# Patient Record
Sex: Female | Born: 1995 | Race: White | Hispanic: No | Marital: Single | Smoking: Former smoker
Health system: Southern US, Community
[De-identification: ages and names within clinical notes are randomized; demographics above are authoritative.]

## PROBLEM LIST (undated history)

## (undated) DIAGNOSIS — F32A Depression, unspecified: Secondary | ICD-10-CM

## (undated) DIAGNOSIS — R51 Headache: Secondary | ICD-10-CM

## (undated) DIAGNOSIS — Z8742 Personal history of other diseases of the female genital tract: Secondary | ICD-10-CM

## (undated) DIAGNOSIS — R87629 Unspecified abnormal cytological findings in specimens from vagina: Secondary | ICD-10-CM

## (undated) DIAGNOSIS — E039 Hypothyroidism, unspecified: Secondary | ICD-10-CM

## (undated) DIAGNOSIS — G8929 Other chronic pain: Secondary | ICD-10-CM

## (undated) DIAGNOSIS — N189 Chronic kidney disease, unspecified: Secondary | ICD-10-CM

## (undated) DIAGNOSIS — K5900 Constipation, unspecified: Secondary | ICD-10-CM

## (undated) DIAGNOSIS — J45909 Unspecified asthma, uncomplicated: Secondary | ICD-10-CM

## (undated) DIAGNOSIS — E079 Disorder of thyroid, unspecified: Secondary | ICD-10-CM

## (undated) DIAGNOSIS — R482 Apraxia: Secondary | ICD-10-CM

## (undated) DIAGNOSIS — K219 Gastro-esophageal reflux disease without esophagitis: Secondary | ICD-10-CM

## (undated) DIAGNOSIS — N809 Endometriosis, unspecified: Secondary | ICD-10-CM

## (undated) DIAGNOSIS — R519 Headache, unspecified: Secondary | ICD-10-CM

## (undated) DIAGNOSIS — O039 Complete or unspecified spontaneous abortion without complication: Secondary | ICD-10-CM

## (undated) DIAGNOSIS — F419 Anxiety disorder, unspecified: Secondary | ICD-10-CM

## (undated) DIAGNOSIS — Z87442 Personal history of urinary calculi: Secondary | ICD-10-CM

## (undated) HISTORY — PX: APPENDECTOMY: SHX54

## (undated) HISTORY — DX: Complete or unspecified spontaneous abortion without complication: O03.9

## (undated) HISTORY — PX: DIAGNOSTIC LAPAROSCOPY: SUR761

## (undated) HISTORY — PX: ENDOMETRIAL BIOPSY: SHX622

## (undated) HISTORY — DX: Unspecified abnormal cytological findings in specimens from vagina: R87.629

---
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
## 2014-02-27 ENCOUNTER — Emergency Department: Payer: Self-pay | Admitting: Internal Medicine

## 2014-06-03 ENCOUNTER — Emergency Department: Payer: Self-pay | Admitting: Emergency Medicine

## 2014-06-03 LAB — URINALYSIS, COMPLETE
Bilirubin,UR: NEGATIVE
Glucose,UR: NEGATIVE mg/dL (ref 0–75)
Ketone: NEGATIVE
Nitrite: NEGATIVE
Ph: 6 (ref 4.5–8.0)
Protein: NEGATIVE
RBC,UR: 1 /HPF (ref 0–5)
Specific Gravity: 1.013 (ref 1.003–1.030)
Squamous Epithelial: 2
WBC UR: 5 /HPF (ref 0–5)

## 2014-06-03 LAB — CBC WITH DIFFERENTIAL/PLATELET
Basophil #: 0.1 10*3/uL (ref 0.0–0.1)
Basophil %: 0.8 %
Eosinophil #: 0.1 10*3/uL (ref 0.0–0.7)
Eosinophil %: 1.3 %
HCT: 44.1 % (ref 35.0–47.0)
HGB: 14.8 g/dL (ref 12.0–16.0)
Lymphocyte #: 1.5 10*3/uL (ref 1.0–3.6)
Lymphocyte %: 21.2 %
MCH: 31.7 pg (ref 26.0–34.0)
MCHC: 33.6 g/dL (ref 32.0–36.0)
MCV: 94 fL (ref 80–100)
Monocyte #: 0.4 x10 3/mm (ref 0.2–0.9)
Monocyte %: 6.2 %
Neutrophil #: 5 10*3/uL (ref 1.4–6.5)
Neutrophil %: 70.5 %
Platelet: 224 10*3/uL (ref 150–440)
RBC: 4.68 10*6/uL (ref 3.80–5.20)
RDW: 12.1 % (ref 11.5–14.5)
WBC: 7.1 10*3/uL (ref 3.6–11.0)

## 2014-06-03 LAB — COMPREHENSIVE METABOLIC PANEL
Albumin: 4.2 g/dL (ref 3.8–5.6)
Alkaline Phosphatase: 56 U/L
Anion Gap: 9 (ref 7–16)
BUN: 8 mg/dL — ABNORMAL LOW (ref 9–21)
Bilirubin,Total: 0.8 mg/dL (ref 0.2–1.0)
Calcium, Total: 8.8 mg/dL — ABNORMAL LOW (ref 9.0–10.7)
Chloride: 107 mmol/L (ref 97–107)
Co2: 24 mmol/L (ref 16–25)
Creatinine: 0.74 mg/dL (ref 0.60–1.30)
EGFR (African American): >60
EGFR (Non-African Amer.): >60
Glucose: 64 mg/dL — ABNORMAL LOW (ref 65–99)
Osmolality: 276 (ref 275–301)
Potassium: 3.6 mmol/L (ref 3.3–4.7)
SGOT(AST): 31 U/L — ABNORMAL HIGH (ref 0–26)
SGPT (ALT): 29 U/L
Sodium: 140 mmol/L (ref 132–141)
Total Protein: 8.4 g/dL (ref 6.4–8.6)

## 2014-06-03 LAB — WET PREP, GENITAL

## 2014-06-03 LAB — HCG, QUANTITATIVE, PREGNANCY: Beta Hcg, Quant.: <1 m[IU]/mL — ABNORMAL LOW

## 2014-06-23 ENCOUNTER — Emergency Department: Payer: Self-pay | Admitting: Emergency Medicine

## 2014-06-24 LAB — CBC WITH DIFFERENTIAL/PLATELET
Basophil #: 0.1 10*3/uL (ref 0.0–0.1)
Basophil %: 0.8 %
Eosinophil #: 0.2 10*3/uL (ref 0.0–0.7)
Eosinophil %: 2.7 %
HCT: 38.3 % (ref 35.0–47.0)
HGB: 13.2 g/dL (ref 12.0–16.0)
Lymphocyte #: 3.1 10*3/uL (ref 1.0–3.6)
Lymphocyte %: 43.7 %
MCH: 32 pg (ref 26.0–34.0)
MCHC: 34.4 g/dL (ref 32.0–36.0)
MCV: 93 fL (ref 80–100)
Monocyte #: 0.5 x10 3/mm (ref 0.2–0.9)
Monocyte %: 6.3 %
Neutrophil #: 3.3 10*3/uL (ref 1.4–6.5)
Neutrophil %: 46.5 %
Platelet: 201 10*3/uL (ref 150–440)
RBC: 4.12 10*6/uL (ref 3.80–5.20)
RDW: 12 % (ref 11.5–14.5)
WBC: 7.2 10*3/uL (ref 3.6–11.0)

## 2014-06-24 LAB — URINALYSIS, COMPLETE
Bacteria: NONE SEEN
Bilirubin,UR: NEGATIVE
Glucose,UR: NEGATIVE mg/dL (ref 0–75)
Ketone: NEGATIVE
Nitrite: NEGATIVE
Ph: 7 (ref 4.5–8.0)
Protein: NEGATIVE
RBC,UR: 1 /HPF (ref 0–5)
Specific Gravity: 1.008 (ref 1.003–1.030)
Squamous Epithelial: 1
WBC UR: 3 /HPF (ref 0–5)

## 2014-06-24 LAB — COMPREHENSIVE METABOLIC PANEL
Albumin: 3.8 g/dL (ref 3.8–5.6)
Alkaline Phosphatase: 54 U/L
Anion Gap: 9 (ref 7–16)
BUN: 4 mg/dL — ABNORMAL LOW (ref 9–21)
Bilirubin,Total: 0.6 mg/dL (ref 0.2–1.0)
Calcium, Total: 8.4 mg/dL — ABNORMAL LOW (ref 9.0–10.7)
Chloride: 109 mmol/L — ABNORMAL HIGH (ref 97–107)
Co2: 23 mmol/L (ref 16–25)
Creatinine: 0.65 mg/dL (ref 0.60–1.30)
EGFR (African American): >60
EGFR (Non-African Amer.): >60
Glucose: 82 mg/dL (ref 65–99)
Osmolality: 277 (ref 275–301)
Potassium: 3.3 mmol/L (ref 3.3–4.7)
SGOT(AST): 25 U/L (ref 0–26)
SGPT (ALT): 21 U/L
Sodium: 141 mmol/L (ref 132–141)
Total Protein: 7.1 g/dL (ref 6.4–8.6)

## 2014-06-24 LAB — TROPONIN I: Troponin-I: <0.02 ng/mL

## 2014-06-24 LAB — LIPASE, BLOOD: Lipase: 70 U/L — ABNORMAL LOW (ref 73–393)

## 2014-07-12 ENCOUNTER — Emergency Department: Payer: Self-pay | Admitting: Emergency Medicine

## 2014-07-12 LAB — COMPREHENSIVE METABOLIC PANEL
Albumin: 3.9 g/dL (ref 3.8–5.6)
Alkaline Phosphatase: 62 U/L
Anion Gap: 4 — ABNORMAL LOW (ref 7–16)
BUN: 8 mg/dL — ABNORMAL LOW (ref 9–21)
Bilirubin,Total: 0.5 mg/dL (ref 0.2–1.0)
Calcium, Total: 8.8 mg/dL — ABNORMAL LOW (ref 9.0–10.7)
Chloride: 110 mmol/L — ABNORMAL HIGH (ref 97–107)
Co2: 22 mmol/L (ref 16–25)
Creatinine: 0.72 mg/dL (ref 0.60–1.30)
EGFR (African American): >60
EGFR (Non-African Amer.): >60
Glucose: 90 mg/dL (ref 65–99)
Osmolality: 270 (ref 275–301)
Potassium: 3.8 mmol/L (ref 3.3–4.7)
SGOT(AST): 30 U/L — ABNORMAL HIGH (ref 0–26)
SGPT (ALT): 29 U/L
Sodium: 136 mmol/L (ref 132–141)
Total Protein: 7.8 g/dL (ref 6.4–8.6)

## 2014-07-12 LAB — URINALYSIS, COMPLETE
Bacteria: NONE SEEN
Bilirubin,UR: NEGATIVE
Blood: NEGATIVE
Glucose,UR: NEGATIVE mg/dL (ref 0–75)
Ketone: NEGATIVE
Leukocyte Esterase: NEGATIVE
Nitrite: NEGATIVE
Ph: 7 (ref 4.5–8.0)
Protein: NEGATIVE
RBC,UR: 3 /HPF (ref 0–5)
Specific Gravity: 1.016 (ref 1.003–1.030)
Squamous Epithelial: 1
WBC UR: 1 /HPF (ref 0–5)

## 2014-07-12 LAB — CBC WITH DIFFERENTIAL/PLATELET
Basophil #: 0.1 10*3/uL (ref 0.0–0.1)
Basophil %: 0.9 %
Eosinophil #: 0.2 10*3/uL (ref 0.0–0.7)
Eosinophil %: 2.5 %
HCT: 39.6 % (ref 35.0–47.0)
HGB: 13.3 g/dL (ref 12.0–16.0)
Lymphocyte #: 1.8 10*3/uL (ref 1.0–3.6)
Lymphocyte %: 29.6 %
MCH: 31.3 pg (ref 26.0–34.0)
MCHC: 33.4 g/dL (ref 32.0–36.0)
MCV: 94 fL (ref 80–100)
Monocyte #: 0.4 x10 3/mm (ref 0.2–0.9)
Monocyte %: 6.7 %
Neutrophil #: 3.7 10*3/uL (ref 1.4–6.5)
Neutrophil %: 60.3 %
Platelet: 240 10*3/uL (ref 150–440)
RBC: 4.24 10*6/uL (ref 3.80–5.20)
RDW: 12.2 % (ref 11.5–14.5)
WBC: 6.1 10*3/uL (ref 3.6–11.0)

## 2014-07-12 LAB — LIPASE, BLOOD: Lipase: 97 U/L (ref 73–393)

## 2014-08-09 ENCOUNTER — Emergency Department: Payer: Self-pay | Admitting: Emergency Medicine

## 2014-08-09 LAB — URINALYSIS, COMPLETE
Bacteria: NONE SEEN
Bilirubin,UR: NEGATIVE
Blood: NEGATIVE
Glucose,UR: NEGATIVE mg/dL (ref 0–75)
Ketone: NEGATIVE
Leukocyte Esterase: NEGATIVE
Nitrite: NEGATIVE
Ph: 8 (ref 4.5–8.0)
Protein: NEGATIVE
RBC,UR: 1 /HPF (ref 0–5)
Specific Gravity: 1.005 (ref 1.003–1.030)
Squamous Epithelial: 1
WBC UR: <1 /HPF (ref 0–5)

## 2014-08-09 LAB — CBC
HCT: 42.9 % (ref 35.0–47.0)
HGB: 14.2 g/dL (ref 12.0–16.0)
MCH: 31 pg (ref 26.0–34.0)
MCHC: 33 g/dL (ref 32.0–36.0)
MCV: 94 fL (ref 80–100)
Platelet: 243 10*3/uL (ref 150–440)
RBC: 4.57 10*6/uL (ref 3.80–5.20)
RDW: 11.9 % (ref 11.5–14.5)
WBC: 7 10*3/uL (ref 3.6–11.0)

## 2014-08-09 LAB — WET PREP, GENITAL

## 2014-08-27 DIAGNOSIS — F419 Anxiety disorder, unspecified: Secondary | ICD-10-CM | POA: Insufficient documentation

## 2014-08-27 DIAGNOSIS — K5900 Constipation, unspecified: Secondary | ICD-10-CM | POA: Insufficient documentation

## 2014-08-27 DIAGNOSIS — N809 Endometriosis, unspecified: Secondary | ICD-10-CM | POA: Insufficient documentation

## 2014-08-27 DIAGNOSIS — J4520 Mild intermittent asthma, uncomplicated: Secondary | ICD-10-CM | POA: Insufficient documentation

## 2014-12-03 ENCOUNTER — Emergency Department: Payer: Self-pay | Admitting: Emergency Medicine

## 2015-01-18 ENCOUNTER — Emergency Department: Payer: Self-pay | Admitting: Student

## 2015-01-18 LAB — COMPREHENSIVE METABOLIC PANEL
Albumin: 4.2 g/dL
Alkaline Phosphatase: 51 U/L
Anion Gap: 9 (ref 7–16)
BUN: 11 mg/dL
Bilirubin,Total: 0.9 mg/dL
Calcium, Total: 9.1 mg/dL
Chloride: 105 mmol/L
Co2: 23 mmol/L
Creatinine: 0.55 mg/dL
EGFR (African American): >60
EGFR (Non-African Amer.): >60
Glucose: 103 mg/dL — ABNORMAL HIGH
Potassium: 3.5 mmol/L
SGOT(AST): 21 U/L
SGPT (ALT): 20 U/L
Sodium: 137 mmol/L
Total Protein: 7.1 g/dL

## 2015-01-18 LAB — URINALYSIS, COMPLETE
Bacteria: NONE SEEN
Bilirubin,UR: NEGATIVE
Glucose,UR: NEGATIVE mg/dL (ref 0–75)
Ketone: NEGATIVE
Nitrite: NEGATIVE
Ph: 7 (ref 4.5–8.0)
Protein: NEGATIVE
RBC,UR: 4 /HPF (ref 0–5)
Specific Gravity: 1.014 (ref 1.003–1.030)
Squamous Epithelial: 1
WBC UR: 1 /HPF (ref 0–5)

## 2015-01-18 LAB — CBC
HCT: 39.8 % (ref 35.0–47.0)
HGB: 13.6 g/dL (ref 12.0–16.0)
MCH: 31.8 pg (ref 26.0–34.0)
MCHC: 34.1 g/dL (ref 32.0–36.0)
MCV: 93 fL (ref 80–100)
Platelet: 209 10*3/uL (ref 150–440)
RBC: 4.27 10*6/uL (ref 3.80–5.20)
RDW: 12.1 % (ref 11.5–14.5)
WBC: 9.3 10*3/uL (ref 3.6–11.0)

## 2015-01-18 LAB — LIPASE, BLOOD: Lipase: 25 U/L

## 2015-02-01 ENCOUNTER — Emergency Department
Admit: 2015-02-01 | Disposition: A | Discharge status: Home / Self Care | Payer: Self-pay | Admitting: Emergency Medicine

## 2015-02-01 LAB — COMPREHENSIVE METABOLIC PANEL
Albumin: 4.6 g/dL
Alkaline Phosphatase: 55 U/L
Anion Gap: 9 (ref 7–16)
BUN: 8 mg/dL
Bilirubin,Total: 0.8 mg/dL
Calcium, Total: 9.8 mg/dL
Chloride: 105 mmol/L
Co2: 25 mmol/L
Creatinine: 0.68 mg/dL
EGFR (African American): >60
EGFR (Non-African Amer.): >60
Glucose: 93 mg/dL
Potassium: 3.8 mmol/L
SGOT(AST): 25 U/L
SGPT (ALT): 25 U/L
Sodium: 139 mmol/L
Total Protein: 7.9 g/dL

## 2015-02-01 LAB — CBC WITH DIFFERENTIAL/PLATELET
Basophil #: 0.1 10*3/uL (ref 0.0–0.1)
Basophil %: 0.7 %
Eosinophil #: 0.2 10*3/uL (ref 0.0–0.7)
Eosinophil %: 1.8 %
HCT: 42.1 % (ref 35.0–47.0)
HGB: 14.2 g/dL (ref 12.0–16.0)
Lymphocyte #: 3.7 10*3/uL — ABNORMAL HIGH (ref 1.0–3.6)
Lymphocyte %: 44 %
MCH: 31.5 pg (ref 26.0–34.0)
MCHC: 33.8 g/dL (ref 32.0–36.0)
MCV: 93 fL (ref 80–100)
Monocyte #: 0.5 x10 3/mm (ref 0.2–0.9)
Monocyte %: 6.3 %
Neutrophil #: 4 10*3/uL (ref 1.4–6.5)
Neutrophil %: 47.2 %
Platelet: 298 10*3/uL (ref 150–440)
RBC: 4.51 10*6/uL (ref 3.80–5.20)
RDW: 12 % (ref 11.5–14.5)
WBC: 8.5 10*3/uL (ref 3.6–11.0)

## 2015-02-01 LAB — URINALYSIS, COMPLETE
Bacteria: NONE SEEN
Bilirubin,UR: NEGATIVE
Glucose,UR: NEGATIVE mg/dL (ref 0–75)
Ketone: NEGATIVE
Nitrite: NEGATIVE
Ph: 5 (ref 4.5–8.0)
Protein: NEGATIVE
Specific Gravity: 1.008 (ref 1.003–1.030)

## 2015-02-01 LAB — TROPONIN I: Troponin-I: <0.03 ng/mL

## 2015-02-01 LAB — LIPASE, BLOOD: Lipase: 50 U/L

## 2015-02-15 NOTE — Op Note (Signed)
PATIENT NAME:  Hannah Shaw, Hannah Shaw MR#:  [id number] DATE OF BIRTH:  12/12/1995  DATE OF PROCEDURE:  08/23/2013  DATE OF BIRTH: 11/30/1981.   DATE OF PROCEDURE: 06/26/2013.   PREOPERATIVE DIAGNOSIS: Chronic pelvic pain.   POSTOPERATIVE DIAGNOSES: Peritoneal endometriosis.   PROCEDURE: Laparoscopic excision of peritoneal endometriosis.   ANESTHESIA: General endotracheal anesthesia.   SURGEON: Dr. Ouida Sills.  INDICATION: This is a 19 year old gravida 0 patient with 4 to 6 month history of chronic pelvic pain, unrelenting, requiring daily narcotics.   PROCEDURE: After adequate general endotracheal anesthesia, the patient's legs were placed in the [place]. The patient's abdomen, perineum, and vagina were prepped with non-Betadine cleansing solution. The patient was draped in normal sterile fashion. The patient's bladder was catheterized yielding 25 mL clear urine. Sponge stick was placed in the vagina to be used for uterine manipulation during the procedure. An infraumbilical incision was made after injecting with 0.5% Marcaine. The laparoscope was then advanced into the abdominal cavity without difficulty and the patient's abdomen was insufflated with carbon dioxide. A second port, 5 mm  port site was placed 2 centimeters medial to the left anterior iliac spine under direct visualization and the prior injection with Marcaine. The port was placed without difficulty. A similar procedure was repeated on the patient's right side, again, 5 mm port under direct visualization was placed. The patient was placed in Trendelenburg. Initial impression showed the lower pelvic peritoneum was congested. Fallopian tubes and ovaries appeared normal. There was notable evidence of endometriosis in both ovarian fossae anterior to the uterosacral ligaments bilaterally. Ureters were identified and their path was tracked. Harmonic scalpel was then brought into the operative field, and each patch of endometriosis  approximately 7 x 5 mm was grasped with atraumatic grasper and this endometriosis was dissected and excised with the Harmonic scalpel without difficulty. Good hemostasis was noted. There was also  a patch of endometriosis in the deep posterior cul-de-sac. Given the angles this could not be excised and the tissue was ablated and fulgurated with the Kleppingers and Harmonic scalpel. The upper abdomen appeared normal. There was no evidence of endometriosis on the bowel and the patient was status post an appendectomy. There were a few loose staples identifying the posterior cul-de-sac intertwined with the peritoneum. These were left in situ. The intra-abdominal pressure was brought to 6 mmHg. The incision sites appeared hemostatic, and again, the ureters appeared normal and coursing around the excisional biopsy areas. Pictures were taken. The patient's abdomen was deflated and the infraumbilical incision was closed with a deep 2-0 Vicryl fascial layer and all skin incisions were closed with interrupted 4-0 Vicryl sutures. Steri-Strips applied and a pressure dressing with a Tegaderm placed. Sponge stick was removed. There were no complications. The patient tolerated the procedure well.   ESTIMATED BLOOD LOSS: Minimal.   INTRA-OP FLUIDS 900 mL.   URINE OUTPUT: Was 25 mL.   The patient was taken to recovery in good condition.    ____________________________ Boykin Nearing, MD tjs:sg D: 08/23/2013 11:22:10 ET T: 08/23/2013 11:35:38 ET JOB#: [id number]  cc: Boykin Nearing, MD, <Dictator> Boykin Nearing MD ELECTRONICALLY SIGNED 08/25/2013 9:50

## 2015-06-29 ENCOUNTER — Emergency Department
Admission: EM | Admit: 2015-06-29 | Discharge: 2015-06-29 | Disposition: A | Discharge status: Home / Self Care | Payer: Medicaid Other | Attending: Emergency Medicine | Admitting: Emergency Medicine

## 2015-06-29 ENCOUNTER — Encounter: Payer: Self-pay | Admitting: Emergency Medicine

## 2015-06-29 DIAGNOSIS — R509 Fever, unspecified: Secondary | ICD-10-CM | POA: Diagnosis present

## 2015-06-29 DIAGNOSIS — Z3202 Encounter for pregnancy test, result negative: Secondary | ICD-10-CM | POA: Insufficient documentation

## 2015-06-29 DIAGNOSIS — L539 Erythematous condition, unspecified: Secondary | ICD-10-CM | POA: Insufficient documentation

## 2015-06-29 DIAGNOSIS — R1110 Vomiting, unspecified: Secondary | ICD-10-CM | POA: Diagnosis not present

## 2015-06-29 DIAGNOSIS — J45909 Unspecified asthma, uncomplicated: Secondary | ICD-10-CM | POA: Diagnosis not present

## 2015-06-29 DIAGNOSIS — Z79899 Other long term (current) drug therapy: Secondary | ICD-10-CM | POA: Insufficient documentation

## 2015-06-29 DIAGNOSIS — Z791 Long term (current) use of non-steroidal anti-inflammatories (NSAID): Secondary | ICD-10-CM | POA: Insufficient documentation

## 2015-06-29 DIAGNOSIS — R5081 Fever presenting with conditions classified elsewhere: Secondary | ICD-10-CM | POA: Diagnosis not present

## 2015-06-29 HISTORY — DX: Endometriosis, unspecified: N80.9

## 2015-06-29 LAB — COMPREHENSIVE METABOLIC PANEL
ALT: 18 U/L (ref 14–54)
AST: 21 U/L (ref 15–41)
Albumin: 4.7 g/dL (ref 3.5–5.0)
Alkaline Phosphatase: 51 U/L (ref 38–126)
Anion gap: 7 (ref 5–15)
BUN: <5 mg/dL — ABNORMAL LOW (ref 6–20)
CO2: 29 mmol/L (ref 22–32)
Calcium: 9.8 mg/dL (ref 8.9–10.3)
Chloride: 104 mmol/L (ref 101–111)
Creatinine, Ser: 0.6 mg/dL (ref 0.44–1.00)
GFR calc Af Amer: >60 mL/min (ref >60)
GFR calc non Af Amer: >60 mL/min (ref >60)
Glucose, Bld: 100 mg/dL — ABNORMAL HIGH (ref 65–99)
Potassium: 4.6 mmol/L (ref 3.5–5.1)
Sodium: 140 mmol/L (ref 135–145)
Total Bilirubin: 0.6 mg/dL (ref 0.3–1.2)
Total Protein: 8 g/dL (ref 6.5–8.1)

## 2015-06-29 LAB — URINALYSIS COMPLETE WITH MICROSCOPIC (ARMC ONLY)
Bilirubin Urine: NEGATIVE
Glucose, UA: NEGATIVE mg/dL
Ketones, ur: NEGATIVE mg/dL
Leukocytes, UA: NEGATIVE
Nitrite: NEGATIVE
Protein, ur: NEGATIVE mg/dL
Specific Gravity, Urine: 1.005 (ref 1.005–1.030)
pH: 8 (ref 5.0–8.0)

## 2015-06-29 LAB — CBC
HCT: 40.7 % (ref 35.0–47.0)
Hemoglobin: 14.3 g/dL (ref 12.0–16.0)
MCH: 32.7 pg (ref 26.0–34.0)
MCHC: 35.1 g/dL (ref 32.0–36.0)
MCV: 93 fL (ref 80.0–100.0)
Platelets: 253 10*3/uL (ref 150–440)
RBC: 4.37 MIL/uL (ref 3.80–5.20)
RDW: 12 % (ref 11.5–14.5)
WBC: 8.1 10*3/uL (ref 3.6–11.0)

## 2015-06-29 LAB — WET PREP, GENITAL
Clue Cells Wet Prep HPF POC: NONE SEEN
Trich, Wet Prep: NONE SEEN

## 2015-06-29 LAB — CHLAMYDIA/NGC RT PCR (ARMC ONLY)
Chlamydia Tr: NOT DETECTED
N gonorrhoeae: NOT DETECTED

## 2015-06-29 LAB — LIPASE, BLOOD: Lipase: 23 U/L (ref 22–51)

## 2015-06-29 LAB — PREGNANCY, URINE: Preg Test, Ur: NEGATIVE

## 2015-06-29 MED ORDER — ONDANSETRON HCL 4 MG/2ML IJ SOLN
4.0000 mg | Freq: Once | INTRAMUSCULAR | Status: DC
Start: 2015-06-29 — End: 2015-06-29
  Filled 2015-06-29: qty 2

## 2015-06-29 MED ORDER — MORPHINE SULFATE (PF) 2 MG/ML IV SOLN
2.0000 mg | Freq: Once | INTRAVENOUS | Status: AC
  Administered 2015-06-29: 2 mg via INTRAMUSCULAR

## 2015-06-29 MED ORDER — ONDANSETRON 4 MG PO TBDP
4.0000 mg | ORAL_TABLET | Freq: Once | ORAL | Status: AC
  Administered 2015-06-29: 4 mg via ORAL
  Filled 2015-06-29: qty 1

## 2015-06-29 MED ORDER — MORPHINE SULFATE (PF) 2 MG/ML IV SOLN
2.0000 mg | Freq: Once | INTRAVENOUS | Status: DC
Start: 2015-06-29 — End: 2015-06-29
  Filled 2015-06-29: qty 1

## 2015-06-29 NOTE — Discharge Instructions (Signed)
Fever, Adult A fever is a temperature of 100.4 F (38 C) or above.  HOME CARE  Take fever medicine as told by your doctor. Do not  take aspirin for fever if you are younger than 20 years of age.  If you are given antibiotic medicine, take it as told. Finish the medicine even if you start to feel better.  Rest.  Drink enough fluids to keep your pee (urine) clear or pale yellow. Do not drink alcohol.  Take a bath or shower with room temperature water. Do not use ice water or alcohol sponge baths.  Wear lightweight, loose clothes. GET HELP RIGHT AWAY IF:   You are short of breath or have trouble breathing.  You are very weak.  You are dizzy or you pass out (faint).  You are very thirsty or are making little or no urine.  You have new pain.  You throw up (vomit) or have watery poop (diarrhea).  You keep throwing up or having watery poop for more than 1 to 2 days.  You have a stiff neck or light bothers your eyes.  You have a skin rash.  You have a fever or problems (symptoms) that last for more than 2 to 3 days.  You have a fever and your problems quickly get worse.  You keep throwing up the fluids you drink.  You do not feel better after 3 days.  You have new problems. MAKE SURE YOU:   Understand these instructions.  Will watch your condition.  Will get help right away if you are not doing well or get worse. Document Released: 07/21/2008 Document Revised: 01/04/2012 Document Reviewed: 08/13/2011 [facility] Patient Information 2015 [place], [place]. This information is not intended to replace advice given to you by your health care provider. Make sure you discuss any questions you have with your health care provider. I'm not sure if the fever her having in the muscle aches related to the tick bite or if you have a viral infection. Just in case I will give you some doxycycline one pill twice a day please return for worse pain higher fever or if she feels sicker. Have  your doctor continue to follow up with you see your doctor next few days.

## 2015-06-29 NOTE — ED Provider Notes (Signed)
[facility] Emergency Department Provider Note  ____________________________________________  Time seen: Approximately 6:14 PM  I have reviewed the triage vital signs and the nursing notes.   HISTORY  Chief Complaint Fever; Generalized Body Aches; and Emesis    HPI Hannah Shaw is a 19 y.o. female patient reports 4 days worth of generalized body aches fever up to 102. Patient also says she is coughing and has vomited several times patient also complains of sore throat patient reports that most the members of her family are sick with the same symptoms. Here the temperature is only 99.9  Past Medical History  Diagnosis Date  . Asthma   . Thyroid disease   . Endometriosis     There are no active problems to display for this patient.   History reviewed. No pertinent past surgical history.  Current Outpatient Rx  Name  Route  Sig  Dispense  Refill  . clonazePAM (KLONOPIN) 1 MG tablet   Oral   Take 1 mg by mouth 2 (two) times daily as needed.         Marland Kitchen HYDROcodone-acetaminophen (NORCO/VICODIN) 5-325 MG per tablet   Oral   Take 1 tablet by mouth every 6 (six) hours as needed for pain.         Marland Kitchen ipratropium (ATROVENT HFA) 17 MCG/ACT inhaler   Inhalation   Inhale 2 puffs into the lungs every 6 (six) hours.         Marland Kitchen levothyroxine (SYNTHROID, LEVOTHROID) 25 MCG tablet   Oral   Take 25 mcg by mouth daily.         . naproxen (NAPROSYN) 500 MG tablet   Oral   Take 1 tablet (500 mg total) by mouth 2 (two) times daily with a meal.   30 tablet   0     Allergies Betadine; Fentanyl; Iodine; and Morphine and related  No family history on file.  Social History Social History  Substance Use Topics  . Smoking status: Never Smoker   . Smokeless tobacco: None  . Alcohol Use: No    Review of Systems Constitutional: See history of present illness Eyes: No visual changes. ENT: No sore throat. Cardiovascular: Denies chest  pain. Respiratory: Denies shortness of breath. Gastrointestinal: See history of present illness.  No diarrhea.  No constipation. Genitourinary: Negative for dysuria. Musculoskeletal: Negative for back pain. Skin: Negative for rash. Neurological: Negative for headaches, focal weakness or numbness.  10-point ROS otherwise negative.  ____________________________________________   PHYSICAL EXAM:  VITAL SIGNS: ED Triage Vitals  Enc Vitals Group     BP 06/29/15 1743 114/68 mmHg     Pulse Rate 06/29/15 1743 104     Resp 06/29/15 1743 16     Temp 06/29/15 1743 99.9 F (37.7 C)     Temp Source 06/29/15 1743 Oral     SpO2 06/29/15 1743 100 %     Weight 06/29/15 1743 100 lb (45.36 kg)     Height 06/29/15 1743 5\' 2"  (1.575 m)     Head Cir --      Peak Flow --      Pain Score 06/29/15 1744 10     Pain Loc --      Pain Edu? --      Excl. in [place]? --     Constitutional: Alert and oriented. Well appearing and in no acute distress. Eyes: Conjunctivae are normal. PERRL. EOMI. Head: Atraumatic. Nose: No congestion/rhinnorhea. Mouth/Throat: Mucous membranes are moist.  Oropharynx non-erythematous.  Neck: No stridor.  Cardiovascular: Normal rate, regular rhythm. Grossly normal heart sounds.  Good peripheral circulation. Respiratory: Normal respiratory effort.  No retractions. Lungs CTAB. Gastrointestinal: Soft tender to palpation and percussion of the lower abdomen only No distention. No abdominal bruits. No CVA tenderness. Genitourinary: Normal perineum vagina has some dark blood and that there is no active bleeding and it however cervix is closed there is no cervical motion tenderness no real adnexal tenderness ovaries are palpable Musculoskeletal: No lower extremity tenderness nor edema.  No joint effusions. There is a small spot of blood and about 2-3 mm of surrounding erythema on the inner right thigh in the middle of the thigh where her tick bite was this does not look like Lyme disease or  anything else. Tick was  on there are couple days ago she says. Neurologic:  Normal speech and language. No gross focal neurologic deficits are appreciated. No gait instability. Skin:  Skin is warm, dry and intact. No rash noted. Psychiatric: Mood and affect are normal. Speech and behavior are normal.  ____________________________________________   LABS (all labs ordered are listed, but only abnormal results are displayed)  Labs Reviewed  COMPREHENSIVE METABOLIC PANEL - Abnormal; Notable for the following:    Glucose, Bld 100 (*)    BUN <5 (*)    All other components within normal limits  URINALYSIS COMPLETEWITH MICROSCOPIC ([facility] ONLY) - Abnormal; Notable for the following:    Color, Urine STRAW (*)    APPearance CLEAR (*)    Hgb urine dipstick 3+ (*)    Bacteria, UA RARE (*)    Squamous Epithelial / LPF 0-5 (*)    All other components within normal limits  CHLAMYDIA/NGC RT PCR ([facility] ONLY)  WET PREP, GENITAL  LIPASE, BLOOD  CBC  PREGNANCY, URINE   ____________________________________________  EKG   ____________________________________________  RADIOLOGY   ____________________________________________   PROCEDURES   ____________________________________________   INITIAL IMPRESSION / ASSESSMENT AND PLAN / ED COURSE  Pertinent labs & imaging results that were available during my care of the patient were reviewed by me and considered in my medical decision making (see chart for details). Patient asked for pain medicine. Patient says that she just gets a little bit of pain medicine with morphine she doesn't have any problems she only gets hives that she gets a lot of morphine I will try 2 mg of morphine for her. ----------------------------------------- 9:04 PM on 06/29/2015 -----------------------------------------  Patient reports she had no hives with the morphine. She says she might have a little bit of itching but not really very much of anything.  Patient also reports she has an ultrasound scheduled her doctors evaluating her for endometriosis. This may explain probably does explain her lower abdominal pain. I will treat her with doxycycline just in case of any infection and the possibility of something with a tick bite she had. ____________________________________________   FINAL CLINICAL IMPRESSION(S) / ED DIAGNOSES  Final diagnoses:  Other specified fever      Nena Polio, MD 06/29/15 2131

## 2015-06-29 NOTE — ED Notes (Signed)
Patient states she has sore throat,, been vomiting for a few days, had teeth removed and feels sick.

## 2015-06-29 NOTE — ED Notes (Signed)
Pt reports fever (103), body aches and vomiting x3 days.

## 2015-06-30 LAB — POCT RAPID STREP A: Streptococcus, Group A Screen (Direct): NEGATIVE

## 2015-07-03 ENCOUNTER — Telehealth: Payer: Self-pay | Admitting: Emergency Medicine

## 2015-07-04 ENCOUNTER — Telehealth: Payer: Self-pay | Admitting: Emergency Medicine

## 2015-07-04 NOTE — ED Notes (Signed)
Mom and patient called saying that pt has rash from doxycyclinej.  I told her tostop the med if she felt that was making her have rash.  Taking benadryl.  Says she just started thsi med yesterday.  I asked about follow up plans and she says she is going to pcp on Monday.  i asked her to call pcp and let them know about the rash and be seen.  She agrees.

## 2015-08-12 ENCOUNTER — Emergency Department
Admission: EM | Admit: 2015-08-12 | Discharge: 2015-08-13 | Disposition: A | Discharge status: Home / Self Care | Payer: Medicaid Other | Attending: Emergency Medicine | Admitting: Emergency Medicine

## 2015-08-12 ENCOUNTER — Encounter: Payer: Self-pay | Admitting: Emergency Medicine

## 2015-08-12 DIAGNOSIS — N941 Unspecified dyspareunia: Secondary | ICD-10-CM | POA: Diagnosis not present

## 2015-08-12 DIAGNOSIS — Z79899 Other long term (current) drug therapy: Secondary | ICD-10-CM | POA: Insufficient documentation

## 2015-08-12 DIAGNOSIS — G8929 Other chronic pain: Secondary | ICD-10-CM

## 2015-08-12 DIAGNOSIS — Z3202 Encounter for pregnancy test, result negative: Secondary | ICD-10-CM | POA: Insufficient documentation

## 2015-08-12 DIAGNOSIS — Z791 Long term (current) use of non-steroidal anti-inflammatories (NSAID): Secondary | ICD-10-CM | POA: Insufficient documentation

## 2015-08-12 DIAGNOSIS — R102 Pelvic and perineal pain: Secondary | ICD-10-CM | POA: Diagnosis present

## 2015-08-12 LAB — CBC WITH DIFFERENTIAL/PLATELET
Basophils Absolute: 0.1 10*3/uL (ref 0–0.1)
Basophils Relative: 1 %
Eosinophils Absolute: 0.1 10*3/uL (ref 0–0.7)
Eosinophils Relative: 2 %
HCT: 41.1 % (ref 35.0–47.0)
Hemoglobin: 14.5 g/dL (ref 12.0–16.0)
Lymphocytes Relative: 32 %
Lymphs Abs: 2.9 10*3/uL (ref 1.0–3.6)
MCH: 32.1 pg (ref 26.0–34.0)
MCHC: 35.3 g/dL (ref 32.0–36.0)
MCV: 91.1 fL (ref 80.0–100.0)
Monocytes Absolute: 0.6 10*3/uL (ref 0.2–0.9)
Monocytes Relative: 7 %
Neutro Abs: 5.5 10*3/uL (ref 1.4–6.5)
Neutrophils Relative %: 58 %
Platelets: 251 10*3/uL (ref 150–440)
RBC: 4.52 MIL/uL (ref 3.80–5.20)
RDW: 11.7 % (ref 11.5–14.5)
WBC: 9.2 10*3/uL (ref 3.6–11.0)

## 2015-08-12 LAB — URINALYSIS COMPLETE WITH MICROSCOPIC (ARMC ONLY)
Bacteria, UA: NONE SEEN
Bilirubin Urine: NEGATIVE
Glucose, UA: NEGATIVE mg/dL
Ketones, ur: NEGATIVE mg/dL
Nitrite: NEGATIVE
Protein, ur: NEGATIVE mg/dL
Specific Gravity, Urine: 1.011 (ref 1.005–1.030)
pH: 5 (ref 5.0–8.0)

## 2015-08-12 LAB — COMPREHENSIVE METABOLIC PANEL
ALT: 22 U/L (ref 14–54)
AST: 22 U/L (ref 15–41)
Albumin: 4.5 g/dL (ref 3.5–5.0)
Alkaline Phosphatase: 50 U/L (ref 38–126)
Anion gap: 6 (ref 5–15)
BUN: 8 mg/dL (ref 6–20)
CO2: 26 mmol/L (ref 22–32)
Calcium: 9.1 mg/dL (ref 8.9–10.3)
Chloride: 105 mmol/L (ref 101–111)
Creatinine, Ser: 0.61 mg/dL (ref 0.44–1.00)
GFR calc Af Amer: >60 mL/min (ref >60)
GFR calc non Af Amer: >60 mL/min (ref >60)
Glucose, Bld: 87 mg/dL (ref 65–99)
Potassium: 4.4 mmol/L (ref 3.5–5.1)
Sodium: 137 mmol/L (ref 135–145)
Total Bilirubin: 1.2 mg/dL (ref 0.3–1.2)
Total Protein: 7.8 g/dL (ref 6.5–8.1)

## 2015-08-12 LAB — POCT PREGNANCY, URINE: Preg Test, Ur: NEGATIVE

## 2015-08-12 NOTE — ED Notes (Signed)
MD at bedside. 

## 2015-08-12 NOTE — ED Notes (Addendum)
Lower abdominal pain.  Patient states she has history of endometriosis, pain worsened tonight.  Normally take Percocet 5/325 mg for pain.  Patient states she still has RX, but not helping pain tonight.  Last taken this morning at 0900.

## 2015-08-12 NOTE — ED Notes (Signed)
POC Urine pregnancy: Negative 

## 2015-08-12 NOTE — ED Provider Notes (Signed)
[facility] Emergency Department Provider Note  ____________________________________________  Time seen: 11:30 PM  I have reviewed the triage vital signs and the nursing notes.   HISTORY  Chief Complaint Abdominal Pain      HPI Hannah Shaw is a 19 y.o. female presents with central pelvic pain for "weeks". Patient admits to history of endometriosis for which she takes Percocet at home which she is prescribed by her PCP. Patient states however that her Percocet has not helping her pain current pain score is 10 out of 10. Patient also admits to unprotected sex and dyspareunia.     Past Medical History  Diagnosis Date  . Asthma   . Thyroid disease   . Endometriosis     There are no active problems to display for this patient.   Past surgical history Appendectomy  Current Outpatient Rx  Name  Route  Sig  Dispense  Refill  . clonazePAM (KLONOPIN) 1 MG tablet   Oral   Take 1 mg by mouth 2 (two) times daily as needed.         Marland Kitchen HYDROcodone-acetaminophen (NORCO/VICODIN) 5-325 MG per tablet   Oral   Take 1 tablet by mouth every 6 (six) hours as needed for pain.         Marland Kitchen ipratropium (ATROVENT HFA) 17 MCG/ACT inhaler   Inhalation   Inhale 2 puffs into the lungs every 6 (six) hours.         Marland Kitchen levothyroxine (SYNTHROID, LEVOTHROID) 25 MCG tablet   Oral   Take 25 mcg by mouth daily.         . naproxen (NAPROSYN) 500 MG tablet   Oral   Take 1 tablet (500 mg total) by mouth 2 (two) times daily with a meal.   30 tablet   0     Allergies Betadine; Fentanyl; Iodine; and Morphine and related  No family history on file.  Social History Social History  Substance Use Topics  . Smoking status: Never Smoker   . Smokeless tobacco: None  . Alcohol Use: No    Review of Systems  Constitutional: Negative for fever. Eyes: Negative for visual changes. ENT: Negative for sore throat. Cardiovascular: Negative for chest pain. Respiratory:  Negative for shortness of breath. Gastrointestinal: Negative for abdominal pain, vomiting and diarrhea. Positive for pelvic pain Genitourinary: Negative for dysuria. Musculoskeletal: Negative for back pain. Skin: Negative for rash. Neurological: Negative for headaches, focal weakness or numbness.   10-point ROS otherwise negative.  ____________________________________________   PHYSICAL EXAM:  VITAL SIGNS: ED Triage Vitals  Enc Vitals Group     BP 08/12/15 2118 122/69 mmHg     Pulse Rate 08/12/15 2118 95     Resp 08/12/15 2118 16     Temp 08/12/15 2118 98.1 F (36.7 C)     Temp Source 08/12/15 2118 Oral     SpO2 08/12/15 2118 99 %     Weight 08/12/15 2118 105 lb (47.628 kg)     Height 08/12/15 2118 5\' 2"  (1.575 m)     Head Cir --      Peak Flow --      Pain Score 08/12/15 2121 10     Pain Loc --      Pain Edu? --      Excl. in [place]? --      Constitutional: Alert and oriented. Well appearing and in no distress. Eyes: Conjunctivae are normal. PERRL. Normal extraocular movements. ENT   Head: Normocephalic and atraumatic.  Nose: No congestion/rhinnorhea.   Mouth/Throat: Mucous membranes are moist.   Neck: No stridor. Cardiovascular: Normal rate, regular rhythm. Normal and symmetric distal pulses are present in all extremities. No murmurs, rubs, or gallops. Respiratory: Normal respiratory effort without tachypnea nor retractions. Breath sounds are clear and equal bilaterally. No wheezes/rales/rhonchi. Gastrointestinal: Soft and nontender. No distention. There is no CVA tenderness. Suprapubic tenderness to palpation.  Genitourinary: Unremarkable external vaginal exam speculum exam likewise unremarkable. No cervical motion tenderness. Musculoskeletal: Nontender with normal range of motion in all extremities. No joint effusions.  No lower extremity tenderness nor edema. Neurologic:  Normal speech and language. No gross focal neurologic deficits are appreciated.  Speech is normal.  Skin:  Skin is warm, dry and intact. No rash noted. Psychiatric: Mood and affect are normal. Speech and behavior are normal. Patient exhibits appropriate insight and judgment.  ____________________________________________    LABS (pertinent positives/negatives)  Labs Reviewed  URINALYSIS COMPLETEWITH MICROSCOPIC ([place]) - Abnormal; Notable for the following:    Color, Urine YELLOW (*)    APPearance CLEAR (*)    Hgb urine dipstick 2+ (*)    Leukocytes, UA TRACE (*)    Squamous Epithelial / LPF 0-5 (*)    All other components within normal limits  CBC WITH DIFFERENTIAL/PLATELET  COMPREHENSIVE METABOLIC PANEL  POC URINE PREG, ED  POCT PREGNANCY, URINE         RADIOLOGY     US Pelvis Complete (Final result) Result time: 08/13/15 03:34:14   Final result by Rad Results In Interface (08/13/15 03:34:14)   Narrative:   CLINICAL DATA: Right-sided pelvic pain for 3-4 days.  EXAM: TRANSABDOMINAL AND TRANSVAGINAL ULTRASOUND OF PELVIS  TECHNIQUE: Both transabdominal and transvaginal ultrasound examinations of the pelvis were performed. Transabdominal technique was performed for global imaging of the pelvis including uterus, ovaries, adnexal regions, and pelvic cul-de-sac. It was necessary to proceed with endovaginal exam following the transabdominal exam to visualize the uterus, endometrium and ovaries.  COMPARISON: Pelvic ultrasound 02/01/2015  FINDINGS: Uterus  Measurements: 5.1 x 2.9 x 3.2 cm. No fibroids or other mass visualized.  Endometrium  Thickness: 6 mm. No focal abnormality visualized.  Right ovary  Measurements: 2.1 x 1.8 x 1.9 cm. Normal appearance/no adnexal mass. Blood flow is noted.  Left ovary  Measurements: 2.5 x 1.7 x 1.8 cm. Normal appearance/no adnexal mass. Blood flow is noted. Physiologic follicles are seen.  Other findings  Trace free fluid.  IMPRESSION: Normal pelvic ultrasound.   Electronically  Signed By: Jeb Levering M.D. On: 08/13/2015 03:34          US Transvaginal Non-OB (Final result) Result time: 08/13/15 03:34:14   Final result by Rad Results In Interface (08/13/15 03:34:14)   Narrative:   CLINICAL DATA: Right-sided pelvic pain for 3-4 days.  EXAM: TRANSABDOMINAL AND TRANSVAGINAL ULTRASOUND OF PELVIS  TECHNIQUE: Both transabdominal and transvaginal ultrasound examinations of the pelvis were performed. Transabdominal technique was performed for global imaging of the pelvis including uterus, ovaries, adnexal regions, and pelvic cul-de-sac. It was necessary to proceed with endovaginal exam following the transabdominal exam to visualize the uterus, endometrium and ovaries.  COMPARISON: Pelvic ultrasound 02/01/2015  FINDINGS: Uterus  Measurements: 5.1 x 2.9 x 3.2 cm. No fibroids or other mass visualized.  Endometrium  Thickness: 6 mm. No focal abnormality visualized.  Right ovary  Measurements: 2.1 x 1.8 x 1.9 cm. Normal appearance/no adnexal mass. Blood flow is noted.  Left ovary  Measurements: 2.5 x 1.7 x 1.8 cm. Normal appearance/no adnexal  mass. Blood flow is noted. Physiologic follicles are seen.  Other findings  Trace free fluid.  IMPRESSION: Normal pelvic ultrasound.   Electronically Signed By: Jeb Levering M.D. On: 08/13/2015 03:34          INITIAL IMPRESSION / ASSESSMENT AND PLAN / ED COURSE  Pertinent labs & imaging results that were available during my care of the patient were reviewed by me and considered in my medical decision making (see chart for details).    ____________________________________________   FINAL CLINICAL IMPRESSION(S) / ED DIAGNOSES  Final diagnoses:  Chronic pelvic pain in female      Gregor Hams, MD 08/13/15 [phone]

## 2015-08-13 ENCOUNTER — Emergency Department: Payer: Medicaid Other

## 2015-08-13 LAB — WET PREP, GENITAL
Clue Cells Wet Prep HPF POC: NONE SEEN
Trich, Wet Prep: NONE SEEN
Yeast Wet Prep HPF POC: NONE SEEN

## 2015-08-13 LAB — CHLAMYDIA/NGC RT PCR (ARMC ONLY)
Chlamydia Tr: NOT DETECTED
N gonorrhoeae: NOT DETECTED

## 2015-08-13 MED ORDER — DIPHENHYDRAMINE HCL 25 MG PO CAPS
25.0000 mg | ORAL_CAPSULE | Freq: Once | ORAL | Status: AC
  Administered 2015-08-13: 25 mg via ORAL
  Filled 2015-08-13: qty 1

## 2015-08-13 MED ORDER — HYDROMORPHONE HCL 1 MG/ML IJ SOLN
1.0000 mg | Freq: Once | INTRAMUSCULAR | Status: AC
  Administered 2015-08-13: 1 mg via INTRAMUSCULAR
  Filled 2015-08-13: qty 1

## 2015-08-13 NOTE — Discharge Instructions (Signed)

## 2015-08-13 NOTE — ED Notes (Signed)
Pt up to restroom with steady gait. No distress noted. Pt reports pain has improved slightly.

## 2015-08-13 NOTE — ED Notes (Signed)
Patient transported to Ultrasound 

## 2015-09-24 DIAGNOSIS — R112 Nausea with vomiting, unspecified: Secondary | ICD-10-CM | POA: Insufficient documentation

## 2015-09-24 DIAGNOSIS — N809 Endometriosis, unspecified: Secondary | ICD-10-CM | POA: Insufficient documentation

## 2015-09-24 DIAGNOSIS — Z3202 Encounter for pregnancy test, result negative: Secondary | ICD-10-CM | POA: Diagnosis not present

## 2015-09-24 DIAGNOSIS — R079 Chest pain, unspecified: Secondary | ICD-10-CM | POA: Insufficient documentation

## 2015-09-24 DIAGNOSIS — R109 Unspecified abdominal pain: Secondary | ICD-10-CM | POA: Diagnosis present

## 2015-09-24 DIAGNOSIS — J45901 Unspecified asthma with (acute) exacerbation: Secondary | ICD-10-CM | POA: Diagnosis not present

## 2015-09-24 DIAGNOSIS — Z79899 Other long term (current) drug therapy: Secondary | ICD-10-CM | POA: Diagnosis not present

## 2015-09-24 DIAGNOSIS — Z791 Long term (current) use of non-steroidal anti-inflammatories (NSAID): Secondary | ICD-10-CM | POA: Diagnosis not present

## 2015-09-24 LAB — POCT PREGNANCY, URINE: Preg Test, Ur: NEGATIVE

## 2015-09-24 LAB — URINALYSIS COMPLETE WITH MICROSCOPIC (ARMC ONLY)
Bilirubin Urine: NEGATIVE
Glucose, UA: NEGATIVE mg/dL
Ketones, ur: NEGATIVE mg/dL
Nitrite: NEGATIVE
Protein, ur: NEGATIVE mg/dL
Specific Gravity, Urine: 1.024 (ref 1.005–1.030)
pH: 5 (ref 5.0–8.0)

## 2015-09-24 LAB — PREGNANCY, URINE: Preg Test, Ur: NEGATIVE

## 2015-09-24 NOTE — ED Notes (Signed)
N.v.d since yest also co abd pain.

## 2015-09-25 ENCOUNTER — Emergency Department
Admission: EM | Admit: 2015-09-25 | Discharge: 2015-09-25 | Disposition: A | Discharge status: Home / Self Care | Payer: Medicaid Other | Attending: Emergency Medicine | Admitting: Emergency Medicine

## 2015-09-25 ENCOUNTER — Emergency Department: Payer: Medicaid Other

## 2015-09-25 DIAGNOSIS — R1084 Generalized abdominal pain: Secondary | ICD-10-CM

## 2015-09-25 DIAGNOSIS — N809 Endometriosis, unspecified: Secondary | ICD-10-CM

## 2015-09-25 DIAGNOSIS — R079 Chest pain, unspecified: Secondary | ICD-10-CM

## 2015-09-25 LAB — CBC WITH DIFFERENTIAL/PLATELET
Basophils Absolute: 0.1 10*3/uL (ref 0–0.1)
Basophils Relative: 1 %
Eosinophils Absolute: 0.2 10*3/uL (ref 0–0.7)
Eosinophils Relative: 3 %
HCT: 41.1 % (ref 35.0–47.0)
Hemoglobin: 14 g/dL (ref 12.0–16.0)
Lymphocytes Relative: 38 %
Lymphs Abs: 2.9 10*3/uL (ref 1.0–3.6)
MCH: 31.6 pg (ref 26.0–34.0)
MCHC: 34.1 g/dL (ref 32.0–36.0)
MCV: 92.5 fL (ref 80.0–100.0)
Monocytes Absolute: 0.5 10*3/uL (ref 0.2–0.9)
Monocytes Relative: 7 %
Neutro Abs: 3.9 10*3/uL (ref 1.4–6.5)
Neutrophils Relative %: 51 %
Platelets: 232 10*3/uL (ref 150–440)
RBC: 4.45 MIL/uL (ref 3.80–5.20)
RDW: 12.1 % (ref 11.5–14.5)
WBC: 7.6 10*3/uL (ref 3.6–11.0)

## 2015-09-25 LAB — LIPASE, BLOOD: Lipase: 25 U/L (ref 11–51)

## 2015-09-25 LAB — COMPREHENSIVE METABOLIC PANEL
ALT: 27 U/L (ref 14–54)
AST: 26 U/L (ref 15–41)
Albumin: 4.9 g/dL (ref 3.5–5.0)
Alkaline Phosphatase: 51 U/L (ref 38–126)
Anion gap: 7 (ref 5–15)
BUN: 10 mg/dL (ref 6–20)
CO2: 28 mmol/L (ref 22–32)
Calcium: 9.4 mg/dL (ref 8.9–10.3)
Chloride: 106 mmol/L (ref 101–111)
Creatinine, Ser: 0.71 mg/dL (ref 0.44–1.00)
GFR calc Af Amer: >60 mL/min (ref >60)
GFR calc non Af Amer: >60 mL/min (ref >60)
Glucose, Bld: 93 mg/dL (ref 65–99)
Potassium: 3.8 mmol/L (ref 3.5–5.1)
Sodium: 141 mmol/L (ref 135–145)
Total Bilirubin: 0.4 mg/dL (ref 0.3–1.2)
Total Protein: 8.1 g/dL (ref 6.5–8.1)

## 2015-09-25 MED ORDER — ONDANSETRON 4 MG PO TBDP: 4.0000 mg | ORAL_TABLET | Freq: Three times a day (TID) | ORAL | Status: DC | PRN

## 2015-09-25 MED ORDER — KETOROLAC TROMETHAMINE 60 MG/2ML IM SOLN
60.0000 mg | Freq: Once | INTRAMUSCULAR | Status: AC
  Administered 2015-09-25: 60 mg via INTRAMUSCULAR
  Filled 2015-09-25: qty 2

## 2015-09-25 MED ORDER — GI COCKTAIL ~~LOC~~
30.0000 mL | Freq: Once | ORAL | Status: AC
  Administered 2015-09-25: 30 mL via ORAL
  Filled 2015-09-25: qty 30

## 2015-09-25 NOTE — ED Provider Notes (Signed)
[facility] Emergency Department Provider Note  ____________________________________________  Time seen: Approximately 216 AM  I have reviewed the triage vital signs and the nursing notes.   HISTORY  Chief Complaint Emesis    HPI Hannah Shaw is a 19 y.o. female who comes into the hospital today with abdominal pain and chest pain. The patient reports the pain symptoms started around 5 PM today. She took Tylenol and ibuprofen. She reports that she is out of Percocet which she takes for her endometriosis. The patient reports that she ran out of Percocet yesterday. She reports that [place] and she was told to come in because she was having chest pain. The pain is under her left breast and is sharp. The patient has some nausea and some vomiting. She rates her pain a 10 out of 10 in intensity. The patient reports that she's had pain in her stomach before with her endometriosis but not pain in her chest like this. She's had some mild shortness of breath and some sweats. The patient decided to come in and be evaluated today.   Past Medical History  Diagnosis Date  . Asthma   . Thyroid disease   . Endometriosis     There are no active problems to display for this patient.   No past surgical history on file.  Current Outpatient Rx  Name  Route  Sig  Dispense  Refill  . clonazePAM (KLONOPIN) 1 MG tablet   Oral   Take 1 mg by mouth 2 (two) times daily as needed.         Marland Kitchen HYDROcodone-acetaminophen (NORCO/VICODIN) 5-325 MG per tablet   Oral   Take 1 tablet by mouth every 6 (six) hours as needed for pain.         Marland Kitchen ipratropium (ATROVENT HFA) 17 MCG/ACT inhaler   Inhalation   Inhale 2 puffs into the lungs every 6 (six) hours.         Marland Kitchen levothyroxine (SYNTHROID, LEVOTHROID) 25 MCG tablet   Oral   Take 25 mcg by mouth daily.         . naproxen (NAPROSYN) 500 MG tablet   Oral   Take 1 tablet (500 mg total) by mouth  2 (two) times daily with a meal.   30 tablet   0   . ondansetron (ZOFRAN ODT) 4 MG disintegrating tablet   Oral   Take 1 tablet (4 mg total) by mouth every 8 (eight) hours as needed for nausea or vomiting.   20 tablet   0     Allergies Betadine; Fentanyl; Iodine; and Morphine and related  No family history on file.  Social History Social History  Substance Use Topics  . Smoking status: Never Smoker   . Smokeless tobacco: Not on file  . Alcohol Use: No    Review of Systems Constitutional: No fever/chills Eyes: No visual changes. ENT: No sore throat. Cardiovascular:  chest pain. Respiratory:  shortness of breath. Gastrointestinal:  abdominal pain. nausea,  vomiting.  No diarrhea.  No constipation. Genitourinary: Negative for dysuria. Musculoskeletal: Negative for back pain. Skin: Negative for rash. Neurological: Negative for headaches, focal weakness or numbness.  10-point ROS otherwise negative.  ____________________________________________   PHYSICAL EXAM:  VITAL SIGNS: ED Triage Vitals  Enc Vitals Group     BP 09/24/15 2303 121/72 mmHg     Pulse Rate 09/24/15 2303 103     Resp 09/24/15 2303 18  Temp 09/24/15 2303 99.1 F (37.3 C)     Temp Source 09/24/15 2303 Oral     SpO2 09/24/15 2303 99 %     Weight 09/24/15 2303 107 lb (48.535 kg)     Height 09/24/15 2303 5\' 2"  (1.575 m)     Head Cir --      Peak Flow --      Pain Score 09/24/15 2304 10     Pain Loc --      Pain Edu? --      Excl. in [place]? --     Constitutional: Alert and oriented. Well appearing and in no acute distress. Eyes: Conjunctivae are normal. PERRL. EOMI. Head: Atraumatic. Nose: No congestion/rhinnorhea. Mouth/Throat: Mucous membranes are moist.  Oropharynx non-erythematous. Cardiovascular: Normal rate, regular rhythm. Grossly normal heart sounds.  Good peripheral circulation. Respiratory: Normal respiratory effort.  No retractions. Lungs CTAB. Gastrointestinal: Soft and  nontender. No distention. No abdominal bruits. No CVA tenderness. Musculoskeletal: No lower extremity tenderness nor edema.   Neurologic:  Normal speech and language.  Skin:  Skin is warm, dry and intact.  Psychiatric: Mood and affect are normal.   ____________________________________________   LABS (all labs ordered are listed, but only abnormal results are displayed)  Labs Reviewed  URINALYSIS COMPLETEWITH MICROSCOPIC ([facility] ONLY) - Abnormal; Notable for the following:    Color, Urine YELLOW (*)    APPearance CLOUDY (*)    Hgb urine dipstick 1+ (*)    Leukocytes, UA 2+ (*)    Bacteria, UA RARE (*)    Squamous Epithelial / LPF 6-30 (*)    All other components within normal limits  CBC WITH DIFFERENTIAL/PLATELET  COMPREHENSIVE METABOLIC PANEL  LIPASE, BLOOD  PREGNANCY, URINE  POCT PREGNANCY, URINE   ____________________________________________  EKG  ED ECG REPORT I, Loney Hering, the attending physician, personally viewed and interpreted this ECG.   Date: 09/25/2015  EKG Time: 418  Rate: 84  Rhythm: normal sinus rhythm  Axis: normal  Intervals:none  ST&T Change: none  ____________________________________________  RADIOLOGY  CXR: No active cardiopulmonary disease ____________________________________________   PROCEDURES  Procedure(s) performed: None  Critical Care performed: No  ____________________________________________   INITIAL IMPRESSION / ASSESSMENT AND PLAN / ED COURSE  Pertinent labs & imaging results that were available during my care of the patient were reviewed by me and considered in my medical decision making (see chart for details).  This is a 19 year old female who comes in today with some chest pain and abdominal pain. The patient does have a history of endometriosis and reports this pain is similar to her endometriosis. The patient also ran out of her pain medication yesterday. The patient is also complaining of some chest pain  that is under her left breast. I did give the patient a GI cocktail as well as some Toradol for her pain. At this time the patient is sleeping comfortably. I will discharge the patient to follow-up with her primary care physician. She did have one set of cardiac enzymes and given that her pain started at 5 I feel it is an appropriate evaluation of her chest pain. The rest of the patient's blood work is unremarkable. She'll be discharged home to follow-up with her primary care physician. ____________________________________________   FINAL CLINICAL IMPRESSION(S) / ED DIAGNOSES  Final diagnoses:  Chest pain, unspecified chest pain type  Generalized abdominal pain  Endometriosis      Loney Hering, MD 09/25/15 [phone]

## 2015-09-25 NOTE — Discharge Instructions (Signed)
Abdominal Pain, Adult Many things can cause abdominal pain. Usually, abdominal pain is not caused by a disease and will improve without treatment. It can often be observed and treated at home. Your health care provider will do a physical exam and possibly order blood tests and X-rays to help determine the seriousness of your pain. However, in many cases, more time must pass before a clear cause of the pain can be found. Before that point, your health care provider may not know if you need more testing or further treatment. HOME CARE INSTRUCTIONS Monitor your abdominal pain for any changes. The following actions may help to alleviate any discomfort you are experiencing:  Only take over-the-counter or prescription medicines as directed by your health care provider.  Do not take laxatives unless directed to do so by your health care provider.  Try a clear liquid diet (broth, tea, or water) as directed by your health care provider. Slowly move to a bland diet as tolerated. SEEK MEDICAL CARE IF:  You have unexplained abdominal pain.  You have abdominal pain associated with nausea or diarrhea.  You have pain when you urinate or have a bowel movement.  You experience abdominal pain that wakes you in the night.  You have abdominal pain that is worsened or improved by eating food.  You have abdominal pain that is worsened with eating fatty foods.  You have a fever. SEEK IMMEDIATE MEDICAL CARE IF:  Your pain does not go away within 2 hours.  You keep throwing up (vomiting).  Your pain is felt only in portions of the abdomen, such as the right side or the left lower portion of the abdomen.  You pass bloody or black tarry stools. MAKE SURE YOU:  Understand these instructions.  Will watch your condition.  Will get help right away if you are not doing well or get worse.   This information is not intended to replace advice given to you by your health care provider. Make sure you discuss  any questions you have with your health care provider.   Document Released: 07/22/2005 Document Revised: 07/03/2015 Document Reviewed: 06/21/2013 Elsevier Interactive Patient Education 2016 [employer]  Nonspecific Chest Pain  Chest pain can be caused by many different conditions. There is always a chance that your pain could be related to something serious, such as a heart attack or a blood clot in your lungs. Chest pain can also be caused by conditions that are not life-threatening. If you have chest pain, it is very important to follow up with your health care provider. CAUSES  Chest pain can be caused by:  Heartburn.  Pneumonia or bronchitis.  Anxiety or stress.  Inflammation around your heart (pericarditis) or lung (pleuritis or pleurisy).  A blood clot in your lung.  A collapsed lung (pneumothorax). It can develop suddenly on its own (spontaneous pneumothorax) or from trauma to the chest.  Shingles infection (varicella-zoster virus).  Heart attack.  Damage to the bones, muscles, and cartilage that make up your chest wall. This can include:  Bruised bones due to injury.  Strained muscles or cartilage due to frequent or repeated coughing or overwork.  Fracture to one or more ribs.  Sore cartilage due to inflammation (costochondritis). RISK FACTORS  Risk factors for chest pain may include:  Activities that increase your risk for trauma or injury to your chest.  Respiratory infections or conditions that cause frequent coughing.  Medical conditions or overeating that can cause heartburn.  Heart disease or family  history of heart disease.  Conditions or health behaviors that increase your risk of developing a blood clot.  Having had chicken pox (varicella zoster). SIGNS AND SYMPTOMS Chest pain can feel like:  Burning or tingling on the surface of your chest or deep in your chest.  Crushing, pressure, aching, or squeezing pain.  Dull or sharp pain that is  worse when you move, cough, or take a deep breath.  Pain that is also felt in your back, neck, shoulder, or arm, or pain that spreads to any of these areas. Your chest pain may come and go, or it may stay constant. DIAGNOSIS Lab tests or other studies may be needed to find the cause of your pain. Your health care provider may have you take a test called an ambulatory ECG (electrocardiogram). An ECG records your heartbeat patterns at the time the test is performed. You may also have other tests, such as:  Transthoracic echocardiogram (TTE). During echocardiography, sound waves are used to create a picture of all of the heart structures and to look at how blood flows through your heart.  Transesophageal echocardiogram (TEE).This is a more advanced imaging test that obtains images from inside your body. It allows your health care provider to see your heart in finer detail.  Cardiac monitoring. This allows your health care provider to monitor your heart rate and rhythm in real time.  Holter monitor. This is a portable device that records your heartbeat and can help to diagnose abnormal heartbeats. It allows your health care provider to track your heart activity for several days, if needed.  Stress tests. These can be done through exercise or by taking medicine that makes your heart beat more quickly.  Blood tests.  Imaging tests. TREATMENT  Your treatment depends on what is causing your chest pain. Treatment may include:  Medicines. These may include:  Acid blockers for heartburn.  Anti-inflammatory medicine.  Pain medicine for inflammatory conditions.  Antibiotic medicine, if an infection is present.  Medicines to dissolve blood clots.  Medicines to treat coronary artery disease.  Supportive care for conditions that do not require medicines. This may include:  Resting.  Applying heat or cold packs to injured areas.  Limiting activities until pain decreases. HOME CARE  INSTRUCTIONS  If you were prescribed an antibiotic medicine, finish it all even if you start to feel better.  Avoid any activities that bring on chest pain.  Do not use any tobacco products, including cigarettes, chewing tobacco, or electronic cigarettes. If you need help quitting, ask your health care provider.  Do not drink alcohol.  Take medicines only as directed by your health care provider.  Keep all follow-up visits as directed by your health care provider. This is important. This includes any further testing if your chest pain does not go away.  If heartburn is the cause for your chest pain, you may be told to keep your head raised (elevated) while sleeping. This reduces the chance that acid will go from your stomach into your esophagus.  Make lifestyle changes as directed by your health care provider. These may include:  Getting regular exercise. Ask your health care provider to suggest some activities that are safe for you.  Eating a heart-healthy diet. A registered dietitian can help you to learn healthy eating options.  Maintaining a healthy weight.  Managing diabetes, if necessary.  Reducing stress. SEEK MEDICAL CARE IF:  Your chest pain does not go away after treatment.  You have a rash with  blisters on your chest.  You have a fever. SEEK IMMEDIATE MEDICAL CARE IF:   Your chest pain is worse.  You have an increasing cough, or you cough up blood.  You have severe abdominal pain.  You have severe weakness.  You faint.  You have chills.  You have sudden, unexplained chest discomfort.  You have sudden, unexplained discomfort in your arms, back, neck, or jaw.  You have shortness of breath at any time.  You suddenly start to sweat, or your skin gets clammy.  You feel nauseous or you vomit.  You suddenly feel light-headed or dizzy.  Your heart begins to beat quickly, or it feels like it is skipping beats. These symptoms may represent a serious  problem that is an emergency. Do not wait to see if the symptoms will go away. Get medical help right away. Call your local emergency services (911 in the [place] Do not drive yourself to the hospital.   This information is not intended to replace advice given to you by your health care provider. Make sure you discuss any questions you have with your health care provider.   Document Released: 07/22/2005 Document Revised: 11/02/2014 Document Reviewed: 05/18/2014 Elsevier Interactive Patient Education [employer].  Endometriosis Endometriosis is a condition in which the tissue that lines the uterus (endometrium) grows outside of its normal location. The tissue may grow in many locations close to the uterus, but it commonly grows on the ovaries, fallopian tubes, vagina, or bowel. Because the uterus expels, or sheds, its lining every menstrual cycle, there is bleeding wherever the endometrial tissue is located. This can cause pain because blood is irritating to tissues not normally exposed to it.  CAUSES  The cause of endometriosis is not known.  SIGNS AND SYMPTOMS  Often, there are no symptoms. When symptoms are present, they can vary with the location of the displaced tissue. Various symptoms can occur at different times. Although symptoms occur mainly during a woman's menstrual period, they can also occur midcycle and usually stop with menopause. Some people may go months with no symptoms at all. Symptoms may include:   Back or abdominal pain.   Heavier bleeding during periods.   Pain during intercourse.   Painful bowel movements.   Infertility. DIAGNOSIS  Your health care provider will do a physical exam and ask about your symptoms. Various tests may be done, such as:   Blood tests and urine tests. These are done to help rule out other problems.   Ultrasound. This test is done to look for abnormal tissue.   An X-ray of the lower bowel (barium enema).  Laparoscopy. In this  procedure, a thin, lighted tube with a tiny camera on the end (laparoscope) is inserted into your abdomen. This helps your health care provider look for abnormal tissue to confirm the diagnosis. The health care provider may also remove a small piece of tissue (biopsy) from any abnormal tissue found. This tissue sample can then be sent to a lab so it can be looked at under a microscope. TREATMENT  Treatment will vary and may include:   Medicines to relieve pain. Nonsteroidal anti-inflammatory drugs (NSAIDs) are a type of pain medicine that can help to relieve the pain caused by endometriosis.  Hormonal therapy. When using hormonal therapy, periods are eliminated. This eliminates the monthly exposure to blood by the displaced endometrial tissue.   Surgery. Surgery may sometimes be done to remove the abnormal endometrial tissue. In severe cases, surgery may be  done to remove the fallopian tubes, uterus, and ovaries (hysterectomy). HOME CARE INSTRUCTIONS   Take all medicines as directed by your health care provider. Do not take aspirin because it may increase bleeding when you are not on hormonal therapy.   Avoid activities that produce pain, including sexual activity. SEEK MEDICAL CARE IF:  You have pelvic pain before, after, or during your periods.  You have pelvic pain between periods that gets worse during your period.  You have pelvic pain during or after sex.  You have pelvic pain with bowel movements or urination, especially during your period.  You have problems getting pregnant.  You have a fever. SEEK IMMEDIATE MEDICAL CARE IF:   Your pain is severe and is not responding to pain medicine.   You have severe nausea and vomiting, or you cannot keep foods down.   You have pain that is limited to the right lower part of your abdomen.   You have swelling or increasing pain in your abdomen.   You see blood in your stool.  MAKE SURE YOU:   Understand these  instructions.  Will watch your condition.  Will get help right away if you are not doing well or get worse.   This information is not intended to replace advice given to you by your health care provider. Make sure you discuss any questions you have with your health care provider.   Document Released: 10/09/2000 Document Revised: 11/02/2014 Document Reviewed: 06/09/2013 Elsevier Interactive Patient Education [employer].

## 2015-10-24 ENCOUNTER — Emergency Department
Admission: EM | Admit: 2015-10-24 | Discharge: 2015-10-24 | Disposition: A | Discharge status: Home / Self Care | Payer: Medicare Other | Attending: Emergency Medicine | Admitting: Emergency Medicine

## 2015-10-24 ENCOUNTER — Emergency Department: Payer: Medicare Other

## 2015-10-24 DIAGNOSIS — Z79899 Other long term (current) drug therapy: Secondary | ICD-10-CM | POA: Diagnosis not present

## 2015-10-24 DIAGNOSIS — S39012A Strain of muscle, fascia and tendon of lower back, initial encounter: Secondary | ICD-10-CM | POA: Diagnosis not present

## 2015-10-24 DIAGNOSIS — Y998 Other external cause status: Secondary | ICD-10-CM | POA: Insufficient documentation

## 2015-10-24 DIAGNOSIS — Y9389 Activity, other specified: Secondary | ICD-10-CM | POA: Diagnosis not present

## 2015-10-24 DIAGNOSIS — Y9289 Other specified places as the place of occurrence of the external cause: Secondary | ICD-10-CM | POA: Diagnosis not present

## 2015-10-24 DIAGNOSIS — X58XXXA Exposure to other specified factors, initial encounter: Secondary | ICD-10-CM | POA: Insufficient documentation

## 2015-10-24 DIAGNOSIS — S3992XA Unspecified injury of lower back, initial encounter: Secondary | ICD-10-CM | POA: Diagnosis present

## 2015-10-24 LAB — URINALYSIS COMPLETE WITH MICROSCOPIC (ARMC ONLY)
Bacteria, UA: NONE SEEN
Bilirubin Urine: NEGATIVE
Glucose, UA: NEGATIVE mg/dL
Ketones, ur: NEGATIVE mg/dL
Leukocytes, UA: NEGATIVE
Nitrite: NEGATIVE
Protein, ur: NEGATIVE mg/dL
Specific Gravity, Urine: 1.009 (ref 1.005–1.030)
pH: 7 (ref 5.0–8.0)

## 2015-10-24 MED ORDER — CYCLOBENZAPRINE HCL 10 MG PO TABS: 10.0000 mg | ORAL_TABLET | Freq: Three times a day (TID) | ORAL | Status: DC | PRN

## 2015-10-24 MED ORDER — ONDANSETRON 8 MG PO TBDP
8.0000 mg | ORAL_TABLET | Freq: Once | ORAL | Status: AC
  Administered 2015-10-24: 8 mg via ORAL
  Filled 2015-10-24: qty 1

## 2015-10-24 MED ORDER — OXYCODONE-ACETAMINOPHEN 5-325 MG PO TABS
1.0000 | ORAL_TABLET | Freq: Once | ORAL | Status: AC
  Administered 2015-10-24: 1 via ORAL
  Filled 2015-10-24: qty 1

## 2015-10-24 MED ORDER — NAPROXEN 500 MG PO TABS: 500.0000 mg | ORAL_TABLET | Freq: Two times a day (BID) | ORAL | Status: DC

## 2015-10-24 NOTE — ED Notes (Signed)
States she developed pain to lower back since yesterday   No injury but states pain radiates into both legs   Ambulates well to treatment area

## 2015-10-24 NOTE — Discharge Instructions (Signed)

## 2015-10-24 NOTE — ED Provider Notes (Signed)
[facility] Emergency Department Provider Note ?  ? ____________________________________________ ? Time seen: 5:04 PM ? I have reviewed the triage vital signs and the nursing notes.  ________ HISTORY ? Chief Complaint Leg Pain     HPI  DANIELIZ FORTHMAN is a 19 y.o. female   who presents emergency department complaining of severe lower back pain radiating down bilateral legs. Patient states that symptoms began suddenly yesterday. She denies any history of injury prior to these symptoms. Patient does state that she has had some "swelling" in bilateral feet times a week but it is not currently swollen. She denies any saddle anesthesia, paresthesias, bowel or bladder dysfunction. Patient denies any urinary symptoms of frequency, dysuria, hematuria. Patient denies any recent trauma, travel, changes from normal activity. ? ? ? Past Medical History  Diagnosis Date  . Asthma   . Thyroid disease   . Endometriosis     There are no active problems to display for this patient.  ? Past Surgical History  Procedure Laterality Date  . Appendectomy    . Endometrial biopsy     ? Current Outpatient Rx  Name  Route  Sig  Dispense  Refill  . oxyCODONE-acetaminophen (PERCOCET) 5-325 MG tablet   Oral   Take 1 tablet by mouth every 4 (four) hours as needed for severe pain.         . clonazePAM (KLONOPIN) 1 MG tablet   Oral   Take 1 mg by mouth 2 (two) times daily as needed.         . cyclobenzaprine (FLEXERIL) 10 MG tablet   Oral   Take 1 tablet (10 mg total) by mouth 3 (three) times daily as needed for muscle spasms.   15 tablet   0   . HYDROcodone-acetaminophen (NORCO/VICODIN) 5-325 MG per tablet   Oral   Take 1 tablet by mouth every 6 (six) hours as needed for pain.         Marland Kitchen ipratropium (ATROVENT HFA) 17 MCG/ACT inhaler   Inhalation   Inhale 2 puffs into the lungs every 6 (six) hours.         Marland Kitchen levothyroxine (SYNTHROID, LEVOTHROID) 25 MCG  tablet   Oral   Take 25 mcg by mouth daily.         . naproxen (NAPROSYN) 500 MG tablet   Oral   Take 1 tablet (500 mg total) by mouth 2 (two) times daily with a meal.   60 tablet   0   . ondansetron (ZOFRAN ODT) 4 MG disintegrating tablet   Oral   Take 1 tablet (4 mg total) by mouth every 8 (eight) hours as needed for nausea or vomiting.   20 tablet   0    ? Allergies Betadine; Fentanyl; Iodine; and Morphine and related ? No family history on file. ? Social History Social History  Substance Use Topics  . Smoking status: Never Smoker   . Smokeless tobacco: None  . Alcohol Use: No   ? Review of Systems Constitutional: no fever. Eyes: no discharge ENT: no sore throat. Cardiovascular: no chest pain. Respiratory: no cough. No sob Gastrointestinal: denies abdominal pain, vomiting, diarrhea, and constipation Genitourinary: no dysuria. Negative for hematuria Musculoskeletal: Endorses severe lumbar back pain. Endorses radicular symptoms bilateral lower extremities. Skin: Negative for rash. Neurological: Negative for headaches  10-point ROS otherwise negative.  _______________ PHYSICAL EXAM: ? VITAL SIGNS:   ED Triage Vitals  Enc Vitals Group     BP 10/24/15 1457  109/74 mmHg     Pulse Rate 10/24/15 1457 96     Resp 10/24/15 1457 16     Temp 10/24/15 1457 98.7 F (37.1 C)     Temp Source 10/24/15 1457 Oral     SpO2 10/24/15 1457 96 %     Weight 10/24/15 1457 101 lb (45.813 kg)     Height 10/24/15 1457 5\' 2"  (1.575 m)     Head Cir --      Peak Flow --      Pain Score 10/24/15 1457 10     Pain Loc --      Pain Edu? --      Excl. in [place]? --    ?  Constitutional: Alert and oriented. Well appearing and in no distress. Eyes: Conjunctivae are normal.  ENT      Head: Normocephalic and atraumatic.      Ears:       Nose: No congestion/rhinnorhea.      Mouth/Throat: Mucous membranes are moist. Hematological/Lymphatic/Immunilogical: No cervical  lymphadenopathy. Cardiovascular: Normal rate, regular rhythm. No lower extremity edema is noted. Respiratory: Normal respiratory effort without tachypnea nor retractions. Gastrointestinal: Soft and nontender. No distention.  Positive CVA tenderness on right, no CVA tenderness on the left.  Genitourinary:  Musculoskeletal: Nontender with normal range of motion in all extremities. No visible deformity to lumbar spine or bilateral lower extremities upon inspection. Patient has full range of motion bilateral hips, bilateral knees, bilateral ankles. Patient with tenderness to palpation midline or region. Diffuse tenderness to palpation over bilateral paraspinal muscle groups. Bilateral dorsalis pedis pulses are palpated. Sensation is equal and intact lower extremities.  Neurologic:  Normal speech and language. No gross focal neurologic deficits are appreciated. Skin:  Skin is warm, dry and intact. No rash noted. Psychiatric: Mood and affect are normal. Speech and behavior are normal. Patient exhibits appropriate insight and judgment.    ___________ RADIOLOGY  Lumbar spine x-ray Impression:  No acute osseous abnormality.   _____________ PROCEDURES ? Procedure(s) performed:    Medications  oxyCODONE-acetaminophen (PERCOCET/ROXICET) 5-325 MG per tablet 1 tablet (1 tablet Oral Given 10/24/15 1559)  ondansetron (ZOFRAN-ODT) disintegrating tablet 8 mg (8 mg Oral Given 10/24/15 1638)    ______________________________________________________ INITIAL IMPRESSION / ASSESSMENT AND PLAN / ED COURSE ? Pertinent labs & imaging results that were available during my care of the patient were reviewed by me and considered in my medical decision making (see chart for details).   Patient presented to the emergency department complaining of lower back pain with radicular symptoms. X-ray and urinalysis was undertaken and resulted with no acute abnormality. Patient is discharged home with diagnosis of lumbar or  spinal muscle strain. She will be given anti-inflammatories and muscle relaxers for symptom control. She is advised to follow-up with orthopedics if symptoms persist past treatment course.     New Prescriptions   CYCLOBENZAPRINE (FLEXERIL) 10 MG TABLET    Take 1 tablet (10 mg total) by mouth 3 (three) times daily as needed for muscle spasms.   NAPROXEN (NAPROSYN) 500 MG TABLET    Take 1 tablet (500 mg total) by mouth 2 (two) times daily with a meal.   ____________________________________________ FINAL CLINICAL IMPRESSION(S) / ED DIAGNOSES?  Final diagnoses:  Strain of lumbar paraspinal muscle, initial encounter       Darletta Moll, PA-C 10/24/15 1704  Delman Kitten, MD 10/24/15 2052

## 2015-10-24 NOTE — ED Notes (Signed)
Pt c/o lower back and BL leg pain for the past week.Hannah Shaw

## 2016-02-15 ENCOUNTER — Emergency Department
Admission: EM | Admit: 2016-02-15 | Discharge: 2016-02-15 | Disposition: A | Discharge status: Home / Self Care | Payer: No Typology Code available for payment source | Attending: Emergency Medicine | Admitting: Emergency Medicine

## 2016-02-15 ENCOUNTER — Encounter: Payer: Self-pay | Admitting: Emergency Medicine

## 2016-02-15 DIAGNOSIS — M545 Low back pain: Secondary | ICD-10-CM | POA: Diagnosis present

## 2016-02-15 DIAGNOSIS — M62838 Other muscle spasm: Secondary | ICD-10-CM | POA: Diagnosis not present

## 2016-02-15 DIAGNOSIS — J45909 Unspecified asthma, uncomplicated: Secondary | ICD-10-CM | POA: Insufficient documentation

## 2016-02-15 DIAGNOSIS — E079 Disorder of thyroid, unspecified: Secondary | ICD-10-CM | POA: Insufficient documentation

## 2016-02-15 DIAGNOSIS — Z79899 Other long term (current) drug therapy: Secondary | ICD-10-CM | POA: Insufficient documentation

## 2016-02-15 DIAGNOSIS — M62830 Muscle spasm of back: Secondary | ICD-10-CM

## 2016-02-15 MED ORDER — NAPROXEN 500 MG PO TABS: 500.0000 mg | ORAL_TABLET | Freq: Two times a day (BID) | ORAL | Status: DC

## 2016-02-15 MED ORDER — CYCLOBENZAPRINE HCL 10 MG PO TABS: 10.0000 mg | ORAL_TABLET | Freq: Three times a day (TID) | ORAL | Status: DC | PRN
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
## 2016-02-15 NOTE — Discharge Instructions (Signed)
Heat Therapy Heat therapy can help ease sore, stiff, injured, and tight muscles and joints. Heat relaxes your muscles, which may help ease your pain.  RISKS AND COMPLICATIONS If you have any of the following conditions, do not use heat therapy unless your health care provider has approved:  Poor circulation.  Healing wounds or scarred skin in the area being treated.  Diabetes, heart disease, or high blood pressure.  Not being able to feel (numbness) the area being treated.  Unusual swelling of the area being treated.  Active infections.  Blood clots.  Cancer.  Inability to communicate pain. This may include young children and people who have problems with their brain function (dementia).  Pregnancy. Heat therapy should only be used on old, pre-existing, or long-lasting (chronic) injuries. Do not use heat therapy on new injuries unless directed by your health care provider. HOW TO USE HEAT THERAPY There are several different kinds of heat therapy, including:  Moist heat pack.  Warm water bath.  Hot water bottle.  Electric heating pad.  Heated gel pack.  Heated wrap.  Electric heating pad. Use the heat therapy method suggested by your health care provider. Follow your health care provider's instructions on when and how to use heat therapy. GENERAL HEAT THERAPY RECOMMENDATIONS  Do not sleep while using heat therapy. Only use heat therapy while you are awake.  Your skin may turn pink while using heat therapy. Do not use heat therapy if your skin turns red.  Do not use heat therapy if you have new pain.  High heat or long exposure to heat can cause burns. Be careful when using heat therapy to avoid burning your skin.  Do not use heat therapy on areas of your skin that are already irritated, such as with a rash or sunburn. SEEK MEDICAL CARE IF:  You have blisters, redness, swelling, or numbness.  You have new pain.  Your pain is worse. MAKE SURE  YOU:  Understand these instructions.  Will watch your condition.  Will get help right away if you are not doing well or get worse.   This information is not intended to replace advice given to you by your health care provider. Make sure you discuss any questions you have with your health care provider.   Document Released: 01/04/2012 Document Revised: 11/02/2014 Document Reviewed: 12/05/2013 Elsevier Interactive Patient Education 2016 [employer]  Back Exercises The following exercises strengthen the muscles that help to support the back. They also help to keep the lower back flexible. Doing these exercises can help to prevent back pain or lessen existing pain. If you have back pain or discomfort, try doing these exercises 2-3 times each day or as told by your health care provider. When the pain goes away, do them once each day, but increase the number of times that you repeat the steps for each exercise (do more repetitions). If you do not have back pain or discomfort, do these exercises once each day or as told by your health care provider. EXERCISES Single Knee to Chest Repeat these steps 3-5 times for each leg:  Lie on your back on a firm bed or the floor with your legs extended.  Bring one knee to your chest. Your other leg should stay extended and in contact with the floor.  Hold your knee in place by grabbing your knee or thigh.  Pull on your knee until you feel a gentle stretch in your lower back.  Hold the stretch for 10-30 seconds.  Slowly release and straighten your leg. Pelvic Tilt Repeat these steps 5-10 times:  Lie on your back on a firm bed or the floor with your legs extended.  Bend your knees so they are pointing toward the ceiling and your feet are flat on the floor.  Tighten your lower abdominal muscles to press your lower back against the floor. This motion will tilt your pelvis so your tailbone points up toward the ceiling instead of pointing to your feet  or the floor.  With gentle tension and even breathing, hold this position for 5-10 seconds. Cat-Cow Repeat these steps until your lower back becomes more flexible:  Get into a hands-and-knees position on a firm surface. Keep your hands under your shoulders, and keep your knees under your hips. You may place padding under your knees for comfort.  Let your head hang down, and point your tailbone toward the floor so your lower back becomes rounded like the back of a cat.  Hold this position for 5 seconds.  Slowly lift your head and point your tailbone up toward the ceiling so your back forms a sagging arch like the back of a cow.  Hold this position for 5 seconds. Press-Ups Repeat these steps 5-10 times:  Lie on your abdomen (face-down) on the floor.  Place your palms near your head, about shoulder-width apart.  While you keep your back as relaxed as possible and keep your hips on the floor, slowly straighten your arms to raise the top half of your body and lift your shoulders. Do not use your back muscles to raise your upper torso. You may adjust the placement of your hands to make yourself more comfortable.  Hold this position for 5 seconds while you keep your back relaxed.  Slowly return to lying flat on the floor. Bridges Repeat these steps 10 times:  Lie on your back on a firm surface.  Bend your knees so they are pointing toward the ceiling and your feet are flat on the floor.  Tighten your buttocks muscles and lift your buttocks off of the floor until your waist is at almost the same height as your knees. You should feel the muscles working in your buttocks and the back of your thighs. If you do not feel these muscles, slide your feet 1-2 inches farther away from your buttocks.  Hold this position for 3-5 seconds.  Slowly lower your hips to the starting position, and allow your buttocks muscles to relax completely. If this exercise is too easy, try doing it with your arms  crossed over your chest. Abdominal Crunches Repeat these steps 5-10 times: 1. Lie on your back on a firm bed or the floor with your legs extended. 2. Bend your knees so they are pointing toward the ceiling and your feet are flat on the floor. 3. Cross your arms over your chest. 4. Tip your chin slightly toward your chest without bending your neck. 5. Tighten your abdominal muscles and slowly raise your trunk (torso) high enough to lift your shoulder blades a tiny bit off of the floor. Avoid raising your torso higher than that, because it can put too much stress on your low back and it does not help to strengthen your abdominal muscles. 6. Slowly return to your starting position. Back Lifts Repeat these steps 5-10 times: 1. Lie on your abdomen (face-down) with your arms at your sides, and rest your forehead on the floor. 2. Tighten the muscles in your legs and your buttocks.  3. Slowly lift your chest off of the floor while you keep your hips pressed to the floor. Keep the back of your head in line with the curve in your back. Your eyes should be looking at the floor. 4. Hold this position for 3-5 seconds. 5. Slowly return to your starting position. SEEK MEDICAL CARE IF:  Your back pain or discomfort gets much worse when you do an exercise.  Your back pain or discomfort does not lessen within 2 hours after you exercise. If you have any of these problems, stop doing these exercises right away. Do not do them again unless your health care provider says that you can. SEEK IMMEDIATE MEDICAL CARE IF:  You develop sudden, severe back pain. If this happens, stop doing the exercises right away. Do not do them again unless your health care provider says that you can.   This information is not intended to replace advice given to you by your health care provider. Make sure you discuss any questions you have with your health care provider.   Document Released: 11/19/2004 Document Revised: 07/03/2015  Document Reviewed: 12/06/2014 Elsevier Interactive Patient Education 2016 Elsevier Inc.  Muscle Cramps and Spasms Muscle cramps and spasms occur when a muscle or muscles tighten and you have no control over this tightening (involuntary muscle contraction). They are a common problem and can develop in any muscle. The most common place is in the calf muscles of the leg. Both muscle cramps and muscle spasms are involuntary muscle contractions, but they also have differences:   Muscle cramps are sporadic and painful. They may last a few seconds to a quarter of an hour. Muscle cramps are often more forceful and last longer than muscle spasms.  Muscle spasms may or may not be painful. They may also last just a few seconds or much longer. CAUSES  It is uncommon for cramps or spasms to be due to a serious underlying problem. In many cases, the cause of cramps or spasms is unknown. Some common causes are:   Overexertion.   Overuse from repetitive motions (doing the same thing over and over).   Remaining in a certain position for a long period of time.   Improper preparation, form, or technique while performing a sport or activity.   Dehydration.   Injury.   Side effects of some medicines.   Abnormally low levels of the salts and ions in your blood (electrolytes), especially potassium and calcium. This could happen if you are taking water pills (diuretics) or you are pregnant.  Some underlying medical problems can make it more likely to develop cramps or spasms. These include, but are not limited to:   Diabetes.   Parkinson disease.   Hormone disorders, such as thyroid problems.   Alcohol abuse.   Diseases specific to muscles, joints, and bones.   Blood vessel disease where not enough blood is getting to the muscles.  HOME CARE INSTRUCTIONS   Stay well hydrated. Drink enough water and fluids to keep your urine clear or pale yellow.  It may be helpful to massage,  stretch, and relax the affected muscle.  For tight or tense muscles, use a warm towel, heating pad, or hot shower water directed to the affected area.  If you are sore or have pain after a cramp or spasm, applying ice to the affected area may relieve discomfort.  Put ice in a plastic bag.  Place a towel between your skin and the bag.  Leave the ice on  for 15-20 minutes, 03-04 times a day.  Medicines used to treat a known cause of cramps or spasms may help reduce their frequency or severity. Only take over-the-counter or prescription medicines as directed by your caregiver. SEEK MEDICAL CARE IF:  Your cramps or spasms get more severe, more frequent, or do not improve over time.  MAKE SURE YOU:   Understand these instructions.  Will watch your condition.  Will get help right away if you are not doing well or get worse.   This information is not intended to replace advice given to you by your health care provider. Make sure you discuss any questions you have with your health care provider.   Document Released: 04/03/2002 Document Revised: 02/06/2013 Document Reviewed: 09/28/2012 Elsevier Interactive Patient Education [employer].

## 2016-03-05 ENCOUNTER — Encounter: Payer: Self-pay | Admitting: *Deleted

## 2016-03-05 ENCOUNTER — Other Ambulatory Visit: Payer: Medicare Other

## 2016-03-05 NOTE — Patient Instructions (Signed)
  Your procedure is scheduled on: 03-13-16 (FRIDAY) Report to [place] To find out your arrival time please call [phone] between 1PM - 3PM on 03-12-16 (THURSDAY)  Remember: Instructions that are not followed completely may result in serious medical risk, up to and including death, or upon the discretion of your surgeon and anesthesiologist your surgery may need to be rescheduled.    _X__ 1. Do not eat food or drink liquids after midnight. No gum chewing or hard candies.     _X__ 2. No Alcohol for 24 hours before or after surgery.   ____ 3. Bring all medications with you on the day of surgery if instructed.    __X_ 4. Notify your doctor if there is any change in your medical condition     (cold, fever, infections).     Do not wear jewelry, make-up, hairpins, clips or nail polish.  Do not wear lotions, powders, or perfumes. You may wear deodorant.  Do not shave 48 hours prior to surgery. Men may shave face and neck.  Do not bring valuables to the hospital.    [facility] is not responsible for any belongings or valuables.               Contacts, dentures or bridgework may not be worn into surgery.  Leave your suitcase in the car. After surgery it may be brought to your room.  For patients admitted to the hospital, discharge time is determined by your reatment team.   Patients discharged the day of surgery will not be allowed to drive home.   Please read over the following fact sheets that you were given:      _X__ Take these medicines the morning of surgery with A SIP OF WATER:    1. LEVOTHYROXINE(SYNTHROID)  2. MAY TAKE KLONOPIN (CLONAZEPAM) IF NEEDED  3.   4.  5.  6.  ____ Fleet Enema (as directed)   _X__ Use CHG Soap as directed  ____ Use inhalers on the day of surgery  ____ Stop metformin 2 days prior to surgery    ____ Take 1/2 of usual insulin dose the night before surgery and none on the morning of surgery.   ____ Stop  Coumadin/Plavix/aspirin-N/A  _X__ Stop Anti-inflammatories-STOP NAPROXEN NOW-NO NSAIDS OR ASPIRIN PRODUCTS-PERCOCET OK TO CONTINUE   ____ Stop supplements until after surgery.    ____ Bring C-Pap to the hospital.

## 2016-03-10 ENCOUNTER — Encounter
Admission: RE | Admit: 2016-03-10 | Discharge: 2016-03-10 | Disposition: A | Discharge status: Home / Self Care | Payer: Medicare Other | Source: Ambulatory Visit | Attending: Obstetrics & Gynecology | Admitting: Obstetrics & Gynecology

## 2016-03-10 DIAGNOSIS — J45909 Unspecified asthma, uncomplicated: Secondary | ICD-10-CM | POA: Diagnosis not present

## 2016-03-10 DIAGNOSIS — G8929 Other chronic pain: Secondary | ICD-10-CM | POA: Diagnosis not present

## 2016-03-10 DIAGNOSIS — R102 Pelvic and perineal pain: Secondary | ICD-10-CM | POA: Diagnosis present

## 2016-03-10 DIAGNOSIS — Z91041 Radiographic dye allergy status: Secondary | ICD-10-CM | POA: Diagnosis not present

## 2016-03-10 DIAGNOSIS — E282 Polycystic ovarian syndrome: Secondary | ICD-10-CM | POA: Diagnosis not present

## 2016-03-10 DIAGNOSIS — N736 Female pelvic peritoneal adhesions (postinfective): Secondary | ICD-10-CM | POA: Diagnosis not present

## 2016-03-10 DIAGNOSIS — Z79899 Other long term (current) drug therapy: Secondary | ICD-10-CM | POA: Diagnosis not present

## 2016-03-10 DIAGNOSIS — Z800 Family history of malignant neoplasm of digestive organs: Secondary | ICD-10-CM | POA: Diagnosis not present

## 2016-03-10 DIAGNOSIS — K219 Gastro-esophageal reflux disease without esophagitis: Secondary | ICD-10-CM | POA: Diagnosis not present

## 2016-03-10 DIAGNOSIS — Z801 Family history of malignant neoplasm of trachea, bronchus and lung: Secondary | ICD-10-CM | POA: Diagnosis not present

## 2016-03-10 DIAGNOSIS — Z885 Allergy status to narcotic agent status: Secondary | ICD-10-CM | POA: Diagnosis not present

## 2016-03-10 DIAGNOSIS — N858 Other specified noninflammatory disorders of uterus: Secondary | ICD-10-CM | POA: Diagnosis not present

## 2016-03-10 DIAGNOSIS — N8311 Corpus luteum cyst of right ovary: Secondary | ICD-10-CM | POA: Diagnosis not present

## 2016-03-10 DIAGNOSIS — Z880 Allergy status to penicillin: Secondary | ICD-10-CM | POA: Diagnosis not present

## 2016-03-10 DIAGNOSIS — Z87442 Personal history of urinary calculi: Secondary | ICD-10-CM | POA: Diagnosis not present

## 2016-03-10 DIAGNOSIS — Z888 Allergy status to other drugs, medicaments and biological substances status: Secondary | ICD-10-CM | POA: Diagnosis not present

## 2016-03-10 DIAGNOSIS — E039 Hypothyroidism, unspecified: Secondary | ICD-10-CM | POA: Diagnosis not present

## 2016-03-10 LAB — CBC
HCT: 41.3 % (ref 35.0–47.0)
Hemoglobin: 14.2 g/dL (ref 12.0–16.0)
MCH: 31.7 pg (ref 26.0–34.0)
MCHC: 34.5 g/dL (ref 32.0–36.0)
MCV: 92.1 fL (ref 80.0–100.0)
Platelets: 235 10*3/uL (ref 150–440)
RBC: 4.48 MIL/uL (ref 3.80–5.20)
RDW: 12.3 % (ref 11.5–14.5)
WBC: 6.4 10*3/uL (ref 3.6–11.0)

## 2016-03-10 LAB — BASIC METABOLIC PANEL
Anion gap: 8 (ref 5–15)
BUN: 5 mg/dL — ABNORMAL LOW (ref 6–20)
CO2: 26 mmol/L (ref 22–32)
Calcium: 9.3 mg/dL (ref 8.9–10.3)
Chloride: 100 mmol/L — ABNORMAL LOW (ref 101–111)
Creatinine, Ser: 0.61 mg/dL (ref 0.44–1.00)
GFR calc Af Amer: >60 mL/min (ref >60)
GFR calc non Af Amer: >60 mL/min (ref >60)
Glucose, Bld: 84 mg/dL (ref 65–99)
Potassium: 3.7 mmol/L (ref 3.5–5.1)
Sodium: 134 mmol/L — ABNORMAL LOW (ref 135–145)

## 2016-03-10 LAB — ABO/RH: ABO/RH(D): A POS

## 2016-03-10 LAB — TYPE AND SCREEN
ABO/RH(D): A POS
Antibody Screen: NEGATIVE

## 2016-03-13 ENCOUNTER — Ambulatory Visit
Admission: RE | Admit: 2016-03-13 | Discharge: 2016-03-13 | Disposition: A | Discharge status: Home / Self Care | Payer: Medicare Other | Source: Ambulatory Visit | Attending: Obstetrics & Gynecology | Admitting: Obstetrics & Gynecology

## 2016-03-13 ENCOUNTER — Encounter
Admission: RE | Disposition: A | Discharge status: Home / Self Care | Payer: Self-pay | Source: Ambulatory Visit | Attending: Obstetrics & Gynecology

## 2016-03-13 ENCOUNTER — Ambulatory Visit: Payer: Medicare Other | Admitting: Anesthesiology

## 2016-03-13 ENCOUNTER — Encounter: Payer: Self-pay | Admitting: *Deleted

## 2016-03-13 DIAGNOSIS — E039 Hypothyroidism, unspecified: Secondary | ICD-10-CM | POA: Insufficient documentation

## 2016-03-13 DIAGNOSIS — E282 Polycystic ovarian syndrome: Secondary | ICD-10-CM | POA: Insufficient documentation

## 2016-03-13 DIAGNOSIS — Z880 Allergy status to penicillin: Secondary | ICD-10-CM | POA: Insufficient documentation

## 2016-03-13 DIAGNOSIS — K219 Gastro-esophageal reflux disease without esophagitis: Secondary | ICD-10-CM | POA: Insufficient documentation

## 2016-03-13 DIAGNOSIS — Z800 Family history of malignant neoplasm of digestive organs: Secondary | ICD-10-CM | POA: Insufficient documentation

## 2016-03-13 DIAGNOSIS — Z888 Allergy status to other drugs, medicaments and biological substances status: Secondary | ICD-10-CM | POA: Insufficient documentation

## 2016-03-13 DIAGNOSIS — Z87442 Personal history of urinary calculi: Secondary | ICD-10-CM | POA: Insufficient documentation

## 2016-03-13 DIAGNOSIS — N736 Female pelvic peritoneal adhesions (postinfective): Secondary | ICD-10-CM | POA: Insufficient documentation

## 2016-03-13 DIAGNOSIS — G8929 Other chronic pain: Secondary | ICD-10-CM | POA: Insufficient documentation

## 2016-03-13 DIAGNOSIS — Z91041 Radiographic dye allergy status: Secondary | ICD-10-CM | POA: Insufficient documentation

## 2016-03-13 DIAGNOSIS — Z885 Allergy status to narcotic agent status: Secondary | ICD-10-CM | POA: Insufficient documentation

## 2016-03-13 DIAGNOSIS — N8311 Corpus luteum cyst of right ovary: Secondary | ICD-10-CM | POA: Insufficient documentation

## 2016-03-13 DIAGNOSIS — N858 Other specified noninflammatory disorders of uterus: Secondary | ICD-10-CM | POA: Insufficient documentation

## 2016-03-13 DIAGNOSIS — J45909 Unspecified asthma, uncomplicated: Secondary | ICD-10-CM | POA: Insufficient documentation

## 2016-03-13 DIAGNOSIS — Z801 Family history of malignant neoplasm of trachea, bronchus and lung: Secondary | ICD-10-CM | POA: Insufficient documentation

## 2016-03-13 DIAGNOSIS — Z79899 Other long term (current) drug therapy: Secondary | ICD-10-CM | POA: Insufficient documentation

## 2016-03-13 DIAGNOSIS — R102 Pelvic and perineal pain: Secondary | ICD-10-CM | POA: Insufficient documentation

## 2016-03-13 HISTORY — DX: Chronic kidney disease, unspecified: N18.9

## 2016-03-13 HISTORY — DX: Gastro-esophageal reflux disease without esophagitis: K21.9

## 2016-03-13 HISTORY — DX: Headache, unspecified: R51.9

## 2016-03-13 HISTORY — DX: Headache: R51

## 2016-03-13 HISTORY — PX: LAPAROSCOPY: SHX197

## 2016-03-13 HISTORY — DX: Hypothyroidism, unspecified: E03.9

## 2016-03-13 LAB — POCT PREGNANCY, URINE: Preg Test, Ur: NEGATIVE

## 2016-03-13 SURGERY — LAPAROSCOPY, DIAGNOSTIC
Anesthesia: General | Site: Abdomen | Wound class: Clean Contaminated

## 2016-03-13 MED ORDER — KETOROLAC TROMETHAMINE 30 MG/ML IJ SOLN
INTRAMUSCULAR | Status: AC
  Filled 2016-03-13: qty 1

## 2016-03-13 MED ORDER — KETOROLAC TROMETHAMINE 30 MG/ML IJ SOLN
INTRAMUSCULAR | Status: DC | PRN
  Administered 2016-03-13: 30 mg via INTRAVENOUS

## 2016-03-13 MED ORDER — FENTANYL CITRATE (PF) 100 MCG/2ML IJ SOLN: 25.0000 ug | INTRAMUSCULAR | Status: DC | PRN

## 2016-03-13 MED ORDER — BUPIVACAINE HCL 0.5 % IJ SOLN
INTRAMUSCULAR | Status: DC | PRN
  Administered 2016-03-13: 10 mL

## 2016-03-13 MED ORDER — ACETAMINOPHEN 325 MG PO TABS: 650.0000 mg | ORAL_TABLET | ORAL | Status: DC | PRN

## 2016-03-13 MED ORDER — FAMOTIDINE 20 MG PO TABS
20.0000 mg | ORAL_TABLET | Freq: Once | ORAL | Status: AC
  Administered 2016-03-13: 20 mg via ORAL

## 2016-03-13 MED ORDER — SODIUM CHLORIDE 0.9 % IJ SOLN
INTRAMUSCULAR | Status: AC
  Filled 2016-03-13: qty 10

## 2016-03-13 MED ORDER — LIDOCAINE HCL (CARDIAC) 20 MG/ML IV SOLN
INTRAVENOUS | Status: DC | PRN
  Administered 2016-03-13: 60 mg via INTRAVENOUS

## 2016-03-13 MED ORDER — ROCURONIUM BROMIDE 100 MG/10ML IV SOLN
INTRAVENOUS | Status: DC | PRN
  Administered 2016-03-13: 30 mg via INTRAVENOUS

## 2016-03-13 MED ORDER — FENTANYL CITRATE (PF) 100 MCG/2ML IJ SOLN
INTRAMUSCULAR | Status: AC
  Administered 2016-03-13: 25 ug via INTRAVENOUS
  Filled 2016-03-13: qty 2

## 2016-03-13 MED ORDER — DEXAMETHASONE SODIUM PHOSPHATE 10 MG/ML IJ SOLN
INTRAMUSCULAR | Status: DC | PRN
  Administered 2016-03-13: 5 mg via INTRAVENOUS

## 2016-03-13 MED ORDER — FAMOTIDINE 20 MG PO TABS
ORAL_TABLET | ORAL | Status: AC
  Filled 2016-03-13: qty 1

## 2016-03-13 MED ORDER — METHYLENE BLUE 0.5 % INJ SOLN
INTRAVENOUS | Status: AC
  Filled 2016-03-13: qty 10

## 2016-03-13 MED ORDER — PROPOFOL 10 MG/ML IV BOLUS
INTRAVENOUS | Status: DC | PRN
  Administered 2016-03-13: 140 mg via INTRAVENOUS

## 2016-03-13 MED ORDER — ACETAMINOPHEN 650 MG RE SUPP: 650.0000 mg | RECTAL | Status: DC | PRN

## 2016-03-13 MED ORDER — ONDANSETRON HCL 4 MG/2ML IJ SOLN
INTRAMUSCULAR | Status: DC | PRN
  Administered 2016-03-13: 4 mg via INTRAVENOUS

## 2016-03-13 MED ORDER — FENTANYL CITRATE (PF) 100 MCG/2ML IJ SOLN
INTRAMUSCULAR | Status: DC | PRN
  Administered 2016-03-13: 25 ug via INTRAVENOUS
  Administered 2016-03-13 (×2): 50 ug via INTRAVENOUS
  Administered 2016-03-13: 25 ug via INTRAVENOUS

## 2016-03-13 MED ORDER — LACTATED RINGERS IV SOLN
INTRAVENOUS | Status: DC
  Administered 2016-03-13: 15:00:00 via INTRAVENOUS

## 2016-03-13 MED ORDER — KETOROLAC TROMETHAMINE 30 MG/ML IJ SOLN
30.0000 mg | Freq: Four times a day (QID) | INTRAMUSCULAR | Status: DC
  Administered 2016-03-13: 30 mg via INTRAVENOUS

## 2016-03-13 MED ORDER — SUGAMMADEX SODIUM 200 MG/2ML IV SOLN
INTRAVENOUS | Status: DC | PRN
  Administered 2016-03-13: 100 mg via INTRAVENOUS

## 2016-03-13 MED ORDER — OXYCODONE-ACETAMINOPHEN 5-325 MG PO TABS: 1.0000 | ORAL_TABLET | ORAL | Status: DC | PRN

## 2016-03-13 MED ORDER — MIDAZOLAM HCL 5 MG/5ML IJ SOLN
INTRAMUSCULAR | Status: DC | PRN
  Administered 2016-03-13: 1 mg via INTRAVENOUS

## 2016-03-13 MED ORDER — BUPIVACAINE HCL (PF) 0.5 % IJ SOLN
INTRAMUSCULAR | Status: AC
  Filled 2016-03-13: qty 30

## 2016-03-13 MED ORDER — ONDANSETRON HCL 4 MG/2ML IJ SOLN: 4.0000 mg | Freq: Once | INTRAMUSCULAR | Status: DC | PRN

## 2016-03-13 MED ORDER — FENTANYL CITRATE (PF) 100 MCG/2ML IJ SOLN
25.0000 ug | INTRAMUSCULAR | Status: DC | PRN
  Administered 2016-03-13 (×4): 25 ug via INTRAVENOUS

## 2016-03-13 SURGICAL SUPPLY — 46 items
BAG URO DRAIN 2000ML W/SPOUT (MISCELLANEOUS) ×2 IMPLANT
BLADE SURG SZ11 CARB STEEL (BLADE) ×2 IMPLANT
CANISTER SUCT 1200ML W/VALVE (MISCELLANEOUS) ×2 IMPLANT
CATH FOLEY 2WAY  5CC 16FR (CATHETERS) ×1
CATH ROBINSON RED A/P 16FR (CATHETERS) ×2 IMPLANT
CATH URTH 16FR FL 2W BLN LF (CATHETERS) ×1 IMPLANT
CHLORAPREP W/TINT 26ML (MISCELLANEOUS) ×2 IMPLANT
DRAPE LEGGINS SURG 28X43 STRL (DRAPES) ×2 IMPLANT
DRAPE UNDER BUTTOCK W/FLU (DRAPES) ×2 IMPLANT
DRESSING TELFA 4X3 1S ST N-ADH (GAUZE/BANDAGES/DRESSINGS) ×6 IMPLANT
DRSG TEGADERM 2-3/8X2-3/4 SM (GAUZE/BANDAGES/DRESSINGS) ×4 IMPLANT
ENDOPOUCH RETRIEVER 10 (MISCELLANEOUS) ×2 IMPLANT
GAUZE SPONGE NON-WVN 2X2 STRL (MISCELLANEOUS) ×2 IMPLANT
GLOVE BIOGEL PI IND STRL 6.5 (GLOVE) ×1 IMPLANT
GLOVE BIOGEL PI INDICATOR 6.5 (GLOVE) ×1
GLOVE SURG SYN 6.5 ES PF (GLOVE) ×2 IMPLANT
GOWN STRL REUS W/ TWL LRG LVL3 (GOWN DISPOSABLE) ×2 IMPLANT
GOWN STRL REUS W/ TWL XL LVL3 (GOWN DISPOSABLE) ×1 IMPLANT
GOWN STRL REUS W/TWL LRG LVL3 (GOWN DISPOSABLE) ×2
GOWN STRL REUS W/TWL XL LVL3 (GOWN DISPOSABLE) ×1
GRASPER SUT TROCAR 14GX15 (MISCELLANEOUS) ×2 IMPLANT
IRRIGATION STRYKERFLOW (MISCELLANEOUS) IMPLANT
IRRIGATOR STRYKERFLOW (MISCELLANEOUS)
IV LACTATED RINGERS 1000ML (IV SOLUTION) ×2 IMPLANT
KIT RM TURNOVER CYSTO AR (KITS) ×2 IMPLANT
LABEL OR SOLS (LABEL) IMPLANT
LIGASURE MARYLAND LAP STAND (ELECTROSURGICAL) ×2 IMPLANT
LIQUID BAND (GAUZE/BANDAGES/DRESSINGS) IMPLANT
MANIPULATOR UTERINE 4.5 ZUMI (MISCELLANEOUS) ×2 IMPLANT
NEEDLE VERESS 14GA 120MM (NEEDLE) ×2 IMPLANT
NS IRRIG 500ML POUR BTL (IV SOLUTION) ×2 IMPLANT
PACK LAP CHOLECYSTECTOMY (MISCELLANEOUS) ×2 IMPLANT
PAD OB MATERNITY 4.3X12.25 (PERSONAL CARE ITEMS) ×2 IMPLANT
PAD PREP 24X41 OB/GYN DISP (PERSONAL CARE ITEMS) ×2 IMPLANT
PAD TRENDELENBURG OR TABLE (MISCELLANEOUS) ×2 IMPLANT
SCISSORS METZENBAUM CVD 33 (INSTRUMENTS) ×2 IMPLANT
SHEARS HARMONIC ACE PLUS 36CM (ENDOMECHANICALS) ×2 IMPLANT
SLEEVE ENDOPATH XCEL 5M (ENDOMECHANICALS) ×4 IMPLANT
SPONGE VERSALON 2X2 STRL (MISCELLANEOUS) ×2
SUT MNCRL AB 4-0 PS2 18 (SUTURE) ×2 IMPLANT
SUT VIC AB 2-0 UR6 27 (SUTURE) ×2 IMPLANT
SUT VIC AB 4-0 PS2 18 (SUTURE) ×2 IMPLANT
SYRINGE 10CC LL (SYRINGE) ×2 IMPLANT
TROCAR ENDO BLADELESS 11MM (ENDOMECHANICALS) ×2 IMPLANT
TROCAR XCEL NON-BLD 5MMX100MML (ENDOMECHANICALS) ×2 IMPLANT
TUBING INSUFFLATOR HI FLOW (MISCELLANEOUS) ×2 IMPLANT

## 2016-03-13 NOTE — Anesthesia Procedure Notes (Signed)
Procedure Name: Intubation Date/Time: 03/13/2016 4:58 PM Performed by: Dionne Bucy Pre-anesthesia Checklist: Patient identified, Patient being monitored, Timeout performed, Emergency Drugs available and Suction available Patient Re-evaluated:Patient Re-evaluated prior to inductionOxygen Delivery Method: Circle system utilized Preoxygenation: Pre-oxygenation with 100% oxygen Intubation Type: IV induction Ventilation: Mask ventilation without difficulty Laryngoscope Size: Mac and 3 Grade View: Grade I Tube type: Oral Tube size: 7.0 mm Number of attempts: 1 Airway Equipment and Method: Stylet Placement Confirmation: ETT inserted through vocal cords under direct vision,  positive ETCO2 and breath sounds checked- equal and bilateral Secured at: 19 cm Tube secured with: Tape Dental Injury: Teeth and Oropharynx as per pre-operative assessment

## 2016-03-13 NOTE — Transfer of Care (Signed)
Immediate Anesthesia Transfer of Care Note  Patient: Hannah Shaw  Procedure(s) Performed: Procedure(s): LAPAROSCOPY DIAGNOSTIC (N/A)  Patient Location: PACU  Anesthesia Type:General  Level of Consciousness: awake  Airway & Oxygen Therapy: Patient Spontanous Breathing and Patient connected to face mask oxygen  Post-op Assessment: Report given to RN  Post vital signs: Reviewed and stable  Last Vitals:  Filed Vitals:   03/13/16 1426 03/13/16 1805  BP: 108/81 140/84  Pulse: 100 100  Temp: 37.1 C 37.1 C  Resp: 16 10    Last Pain:  Filed Vitals:   03/13/16 1807  PainSc: 0-No pain         Complications: No apparent anesthesia complications

## 2016-03-13 NOTE — Anesthesia Postprocedure Evaluation (Signed)
Anesthesia Post Note  Patient: Hannah Shaw  Procedure(s) Performed: Procedure(s) (LRB): LAPAROSCOPY DIAGNOSTIC (N/A)  Patient location during evaluation: Other Anesthesia Type: General Level of consciousness: awake and alert Pain management: pain level controlled Vital Signs Assessment: post-procedure vital signs reviewed and stable Respiratory status: spontaneous breathing, nonlabored ventilation, respiratory function stable and patient connected to nasal cannula oxygen Cardiovascular status: blood pressure returned to baseline and stable Postop Assessment: no signs of nausea or vomiting Anesthetic complications: no    Last Vitals:  Filed Vitals:   03/13/16 1902 03/13/16 1916  BP: 112/67 111/73  Pulse: 93 86  Temp:    Resp: 17 16    Last Pain:  Filed Vitals:   03/13/16 1918  PainSc: Jessie

## 2016-03-13 NOTE — H&P (Signed)
H&P Update  PLEASE SEE PAPER H&P  Pt was last seen in my office, and complete history and physical performed.  The surgical history has been reviewed and remains accurate without interval change. The patient was re-examined and patient's physiologic condition has not changed significantly in the last 30 days.  No new pharmacological allergies or types of therapy has been initiated.  Allergies  Allergen Reactions  . Morphine And Related Hives and Shortness Of Breath  . Fentanyl     PT DENIES THIS ALLERGY DURING PHONE INTERVIEW ON 03-05-16  . Betadine [Povidone Iodine] Rash  . Iodine Rash    Past Medical History  Diagnosis Date  . Thyroid disease   . Endometriosis   . Hypothyroidism   . Asthma     WELL CONTROLLED  . Chronic kidney disease     H/O STONES  . GERD (gastroesophageal reflux disease)   . Headache    Past Surgical History  Procedure Laterality Date  . Appendectomy    . Endometrial biopsy      Ht 5\' 2"  (1.575 m)  Wt 48.081 kg (106 lb)  BMI 19.38 kg/m2  LMP 02/24/2016 (Approximate)  NAD RRR no murmurs CTAB, no wheezing, resps unlabored +BS, soft, NTTP No c/c/e Pelvic exam deferred  The above history was confirmed with the patient. The condition still exists that makes this procedure necessary. Surgical plan includes Diagnostic Laparoscopy as confirmed on the consent. The treatment plan remains the same, without new options for care.  The patient understands the potential benefits and risks and the consents have been signed and placed on the chart.     Larey Days, MD Attending Obstetrician Gynecologist [facility]

## 2016-03-13 NOTE — Discharge Instructions (Addendum)
Discharge instructions:  Call office if you have any of the following: fever >101 F, chills, excessive vaginal bleeding, incision drainage or problems, leg pain or redness, or any other concerns.   You may experience some pain in your right upper abdomen and right shoulder, due to the gas placed in your belly during surgery.  This will only subsided with time.  Please be patient!  Please don't limit yourself in terms of routine activity.  You will be able to do most things, although they may take longer to do or be a little painful.  You can do it!  Don't be a hero, but don't be a wimp either!   AMBULATORY SURGERY  DISCHARGE INSTRUCTIONS   1) The drugs that you were given will stay in your system until tomorrow so for the next 24 hours you should not:  A) Drive an automobile B) Make any legal decisions C) Drink any alcoholic beverage   2) You may resume regular meals tomorrow.  Today it is better to start with liquids and gradually work up to solid foods.  You may eat anything you prefer, but it is better to start with liquids, then soup and crackers, and gradually work up to solid foods.   3) Please notify your doctor immediately if you have any unusual bleeding, trouble breathing, redness and pain at the surgery site, drainage, fever, or pain not relieved by medication.    4) Additional Instructions:        Please contact your physician with any problems or Same Day Surgery at [phone], Monday through Friday 6 am to 4 pm, or [place] at [facility] number at [phone].

## 2016-03-13 NOTE — Anesthesia Preprocedure Evaluation (Addendum)
Anesthesia Evaluation  Patient identified by MRN, date of birth, ID band Patient awake    Reviewed: Allergy & Precautions, NPO status , Patient's Chart, lab work & pertinent test results, reviewed documented beta blocker date and time   Airway Mallampati: II  TM Distance: >3 FB     Dental  (+) Chipped, Poor Dentition, Missing, Dental Advisory Given   Pulmonary asthma ,           Cardiovascular      Neuro/Psych  Headaches,    GI/Hepatic GERD  Controlled,  Endo/Other  Hypothyroidism   Renal/GU Renal InsufficiencyRenal disease     Musculoskeletal   Abdominal   Peds  Hematology   Anesthesia Other Findings Asthma under good control.  Reproductive/Obstetrics                            Anesthesia Physical Anesthesia Plan  ASA: II  Anesthesia Plan: General   Post-op Pain Management:    Induction: Intravenous  Airway Management Planned: Oral ETT  Additional Equipment:   Intra-op Plan:   Post-operative Plan:   Informed Consent: I have reviewed the patients History and Physical, chart, labs and discussed the procedure including the risks, benefits and alternatives for the proposed anesthesia with the patient or authorized representative who has indicated his/her understanding and acceptance.     Plan Discussed with: CRNA  Anesthesia Plan Comments:         Anesthesia Quick Evaluation

## 2016-03-14 NOTE — Op Note (Signed)
Hannah Shaw PROCEDURE DATE: 03/13/2016  PATIENT:  Hannah Shaw  20 y.o. 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  Normal upper abdomen, filmy adhesions on the right abdominal wall of nearby bowel.  SPECIMEN: right ovarian cyst wall (in fragments)  COMPLICATIONS: none apparent  DISPOSITION: vital signs stable to PACU   Indication for Surgery: 20 y.o.  G0P0 who has chronic pelvic pain,   Risks of surgery were discussed with the patient including but not limited to: bleeding which may require transfusion or reoperation; infection which may require antibiotics; injury to bowel, bladder, ureters or other surrounding organs; need for additional procedures including laparotomy, blood clot, incisional problems and other postoperative/anesthesia complications. Written informed consent was obtained.    PROCEDURE IN DETAIL:  The patient had sequential compression devices applied to her lower extremities while in the preoperative area.  She was then taken to the operating room where general anesthesia was administered via ET.  She was placed in the dorsal lithotomy position, and was prepped and draped in a sterile manner.  A catheter was inserted into her bladder and attached to constant drainage and a uterine manipulator was then advanced into the uterus .  After a surgical timeout was performed, attention was  turned to the abdomen where an umbilical incision was made with the scalpel.  A 17mm trochar was inserted in the umbilical incision using a visiport method. A survey of the patient's pelvis and abdomen revealed the findings as mentioned above. Two 80mm ports were inserted in the lower left and right quadrants under visualization.    The hemorrhagic cyst on the right ovary was ruptured upon entry, and there was a pool of blood in the posterior culdesac.  The orifice was teased open and the inside of the cyst wall was grasped and separated from the ovarian tissue.  This was handed to nursing as "right ovarian cyst wall".  The site was hemostatic following this manipulation.    The uterine manipulator was decompressed and 20cc of methylene blue was injected into the uterus with immediate spillage of the right tube, but none of the left.  Another 20cc was administered and still no spillage.    The operative site was surveyed, and it was found to be hemostatic.  No intraoperative injury to surrounding organs was noted.  The abdomen was desufflated and all instruments were then removed from the patient's abdomen. The uterine manipulator was removed without complications.  All skin incisions were closed with 4-0 monocryl and covered with surgical glue. The patient tolerated the procedures well.  All instruments, needles, and sponge counts were correct x 2. The patient was taken to the recovery room in stable condition.   ---- Larey Days, MD Attending Obstetrician and Gynecologist [facility]

## 2016-03-16 ENCOUNTER — Encounter: Payer: Self-pay | Admitting: Obstetrics & Gynecology

## 2016-03-17 LAB — SURGICAL PATHOLOGY

## 2016-03-18 ENCOUNTER — Emergency Department
Admission: EM | Admit: 2016-03-18 | Discharge: 2016-03-18 | Disposition: A | Discharge status: Home / Self Care | Payer: Medicare Other | Attending: Emergency Medicine | Admitting: Emergency Medicine

## 2016-03-18 ENCOUNTER — Encounter: Payer: Self-pay | Admitting: Emergency Medicine

## 2016-03-18 DIAGNOSIS — E039 Hypothyroidism, unspecified: Secondary | ICD-10-CM | POA: Diagnosis not present

## 2016-03-18 DIAGNOSIS — Z79899 Other long term (current) drug therapy: Secondary | ICD-10-CM | POA: Insufficient documentation

## 2016-03-18 DIAGNOSIS — J45909 Unspecified asthma, uncomplicated: Secondary | ICD-10-CM | POA: Diagnosis not present

## 2016-03-18 DIAGNOSIS — N189 Chronic kidney disease, unspecified: Secondary | ICD-10-CM | POA: Diagnosis not present

## 2016-03-18 DIAGNOSIS — N809 Endometriosis, unspecified: Secondary | ICD-10-CM | POA: Diagnosis not present

## 2016-03-18 DIAGNOSIS — K625 Hemorrhage of anus and rectum: Secondary | ICD-10-CM | POA: Diagnosis present

## 2016-03-18 LAB — COMPREHENSIVE METABOLIC PANEL
ALT: 25 U/L (ref 14–54)
AST: 26 U/L (ref 15–41)
Albumin: 4.4 g/dL (ref 3.5–5.0)
Alkaline Phosphatase: 45 U/L (ref 38–126)
Anion gap: 8 (ref 5–15)
BUN: 6 mg/dL (ref 6–20)
CO2: 24 mmol/L (ref 22–32)
Calcium: 9.1 mg/dL (ref 8.9–10.3)
Chloride: 108 mmol/L (ref 101–111)
Creatinine, Ser: 0.62 mg/dL (ref 0.44–1.00)
GFR calc Af Amer: >60 mL/min (ref >60)
GFR calc non Af Amer: >60 mL/min (ref >60)
Glucose, Bld: 83 mg/dL (ref 65–99)
Potassium: 3.7 mmol/L (ref 3.5–5.1)
Sodium: 140 mmol/L (ref 135–145)
Total Bilirubin: 0.5 mg/dL (ref 0.3–1.2)
Total Protein: 7.5 g/dL (ref 6.5–8.1)

## 2016-03-18 LAB — CBC
HCT: 41.7 % (ref 35.0–47.0)
Hemoglobin: 14.2 g/dL (ref 12.0–16.0)
MCH: 31.8 pg (ref 26.0–34.0)
MCHC: 33.9 g/dL (ref 32.0–36.0)
MCV: 93.8 fL (ref 80.0–100.0)
Platelets: 259 10*3/uL (ref 150–440)
RBC: 4.45 MIL/uL (ref 3.80–5.20)
RDW: 11.9 % (ref 11.5–14.5)
WBC: 7.6 10*3/uL (ref 3.6–11.0)

## 2016-03-18 LAB — TYPE AND SCREEN
ABO/RH(D): A POS
Antibody Screen: NEGATIVE

## 2016-03-18 MED ORDER — IBUPROFEN 800 MG PO TABS
800.0000 mg | ORAL_TABLET | Freq: Once | ORAL | Status: AC
  Administered 2016-03-18: 800 mg via ORAL
  Filled 2016-03-18: qty 1

## 2016-03-18 NOTE — Discharge Instructions (Signed)
Endometriosis Endometriosis is a condition in which the tissue that lines the uterus (endometrium) grows outside of its normal location. The tissue may grow in many locations close to the uterus, but it commonly grows on the ovaries, fallopian tubes, vagina, or bowel. Because the uterus expels, or sheds, its lining every menstrual cycle, there is bleeding wherever the endometrial tissue is located. This can cause pain because blood is irritating to tissues not normally exposed to it.  CAUSES  The cause of endometriosis is not known.  SIGNS AND SYMPTOMS  Often, there are no symptoms. When symptoms are present, they can vary with the location of the displaced tissue. Various symptoms can occur at different times. Although symptoms occur mainly during a woman's menstrual period, they can also occur midcycle and usually stop with menopause. Some people may go months with no symptoms at all. Symptoms may include:   Back or abdominal pain.   Heavier bleeding during periods.   Pain during intercourse.   Painful bowel movements.   Infertility. DIAGNOSIS  Your health care provider will do a physical exam and ask about your symptoms. Various tests may be done, such as:   Blood tests and urine tests. These are done to help rule out other problems.   Ultrasound. This test is done to look for abnormal tissue.   An X-ray of the lower bowel (barium enema).  Laparoscopy. In this procedure, a thin, lighted tube with a tiny camera on the end (laparoscope) is inserted into your abdomen. This helps your health care provider look for abnormal tissue to confirm the diagnosis. The health care provider may also remove a small piece of tissue (biopsy) from any abnormal tissue found. This tissue sample can then be sent to a lab so it can be looked at under a microscope. TREATMENT  Treatment will vary and may include:   Medicines to relieve pain. Nonsteroidal anti-inflammatory drugs (NSAIDs) are a type of  pain medicine that can help to relieve the pain caused by endometriosis.  Hormonal therapy. When using hormonal therapy, periods are eliminated. This eliminates the monthly exposure to blood by the displaced endometrial tissue.   Surgery. Surgery may sometimes be done to remove the abnormal endometrial tissue. In severe cases, surgery may be done to remove the fallopian tubes, uterus, and ovaries (hysterectomy). HOME CARE INSTRUCTIONS   Take all medicines as directed by your health care provider. Do not take aspirin because it may increase bleeding when you are not on hormonal therapy.   Avoid activities that produce pain, including sexual activity. SEEK MEDICAL CARE IF:  You have pelvic pain before, after, or during your periods.  You have pelvic pain between periods that gets worse during your period.  You have pelvic pain during or after sex.  You have pelvic pain with bowel movements or urination, especially during your period.  You have problems getting pregnant.  You have a fever. SEEK IMMEDIATE MEDICAL CARE IF:   Your pain is severe and is not responding to pain medicine.   You have severe nausea and vomiting, or you cannot keep foods down.   You have pain that is limited to the right lower part of your abdomen.   You have swelling or increasing pain in your abdomen.   You see blood in your stool.  MAKE SURE YOU:   Understand these instructions.  Will watch your condition.  Will get help right away if you are not doing well or get worse.   This information  is not intended to replace advice given to you by your health care provider. Make sure you discuss any questions you have with your health care provider.   Document Released: 10/09/2000 Document Revised: 11/02/2014 Document Reviewed: 06/09/2013 Elsevier Interactive Patient Education 2016 [employer]  Laparoscopic Lysis of Abdominal Adhesions, Care After Refer to this sheet in the next few weeks.  These instructions provide you with information about caring for yourself after your procedure. Your health care provider may also give you more specific instructions. Your treatment has been planned according to current medical practices, but problems sometimes occur. Call your health care provider if you have any problems or questions after your procedure. WHAT TO EXPECT AFTER THE PROCEDURE After your procedure, it is common to have some pain around the incision. HOME CARE INSTRUCTIONS  Take medicines only as directed by your health care provider.  You may need to start by eating only a little at a time. Start with liquids. As your appetite improves, gradually return to eating solid foods.  Do not lift anything that is heavier than 10 lb (4.5 kg) for four weeks.  Return to your normal activities as directed by your health care provider. Ask your health care provider what activities are safe for you.  There are many different ways to close and cover an incision, including stitches (sutures), skin glue, and adhesive strips. Follow instructions from your health care provider about:  Incision care.  Bandage (dressing) changes and removal.  Incision closure removal.  Check your incisions every day for signs of infection. Watch for:  Redness, swelling, or pain.  Fluid , blood, or pus. SEEK MEDICAL CARE IF:  You develop a fever or chills.  You have redness, swelling, or pain at the site of your incisions.  You have fluid, blood, or pus coming from your incisions.  There is a bad smell coming from your incisions or the dressing.  Your pain gets worse.  You have a cough.  You have nausea and vomiting that does not go away after 3 hours. SEEK IMMEDIATE MEDICAL CARE IF:  You develop severe pain in your abdomen or your chest.  You develop shortness of breath.  You develop nausea or vomiting that is severe or keeps coming back.   This information is not intended to replace  advice given to you by your health care provider. Make sure you discuss any questions you have with your health care provider.   Document Released: 02/26/2015 Document Reviewed: 02/26/2015 Elsevier Interactive Patient Education [employer].

## 2016-03-18 NOTE — ED Provider Notes (Signed)
Time Seen: Approximately *1540  I have reviewed the triage notes  Chief Complaint: Rectal Bleeding   History of Present Illness: Hannah Shaw is a 20 y.o. female *who had recent laparoscopic diagnostic testing was found to have extensive endometriosis. Patient had a procedure done on the 19th and states that she's had vaginal bleeding since that time. She states she noticed blood this morning that she thought might be from her rectal area but she is not sure. She states she's used 8 pads throughout the day and has had vaginal bleeding since her procedure. She was also advised that she probably would continue to have some vaginal bleeding and some pelvic discomfort. Patient denies any fever, syncope, etc. She denies any hematuria. Past Medical History  Diagnosis Date  . Thyroid disease   . Endometriosis   . Hypothyroidism   . Asthma     WELL CONTROLLED  . Chronic kidney disease     H/O STONES  . GERD (gastroesophageal reflux disease)   . Headache     There are no active problems to display for this patient.   Past Surgical History  Procedure Laterality Date  . Appendectomy    . Endometrial biopsy    . Laparoscopy N/A 03/13/2016    Procedure: LAPAROSCOPY DIAGNOSTIC;  Surgeon: Honor Loh Ward, MD;  Location: [facility] ORS;  Service: Gynecology;  Laterality: N/A;    Past Surgical History  Procedure Laterality Date  . Appendectomy    . Endometrial biopsy    . Laparoscopy N/A 03/13/2016    Procedure: LAPAROSCOPY DIAGNOSTIC;  Surgeon: Honor Loh Ward, MD;  Location: [facility] ORS;  Service: Gynecology;  Laterality: N/A;    Current Outpatient Rx  Name  Route  Sig  Dispense  Refill  . clonazePAM (KLONOPIN) 1 MG tablet   Oral   Take 1 mg by mouth 2 (two) times daily as needed.         . cyclobenzaprine (FLEXERIL) 10 MG tablet   Oral   Take 1 tablet (10 mg total) by mouth 3 (three) times daily as needed for muscle spasms. Patient not taking: Reported on 03/13/2016   15 tablet   0    . ipratropium (ATROVENT HFA) 17 MCG/ACT inhaler   Inhalation   Inhale 2 puffs into the lungs every 6 (six) hours as needed.          Marland Kitchen levothyroxine (SYNTHROID, LEVOTHROID) 25 MCG tablet   Oral   Take 25 mcg by mouth daily before breakfast.          . naproxen (NAPROSYN) 500 MG tablet   Oral   Take 1 tablet (500 mg total) by mouth 2 (two) times daily with a meal.   60 tablet   0   . ondansetron (ZOFRAN ODT) 4 MG disintegrating tablet   Oral   Take 1 tablet (4 mg total) by mouth every 8 (eight) hours as needed for nausea or vomiting. Patient not taking: Reported on 03/13/2016   20 tablet   0   . oxyCODONE-acetaminophen (PERCOCET) 5-325 MG tablet   Oral   Take 1 tablet by mouth every 4 (four) hours as needed for severe pain.         Marland Kitchen oxyCODONE-acetaminophen (ROXICET) 5-325 MG tablet   Oral   Take 1-2 tablets by mouth every 4 (four) hours as needed for moderate pain or severe pain.   11 tablet   0     Allergies:  Morphine and related; Fentanyl; Betadine; and Iodine  Family History: History reviewed. No pertinent family history.  Social History: Social History  Substance Use Topics  . Smoking status: Never Smoker   . Smokeless tobacco: None  . Alcohol Use: No     Review of Systems:   10 point review of systems was performed and was otherwise negative:  Constitutional: No fever Eyes: No visual disturbances ENT: No sore throat, ear pain Cardiac: No chest pain Respiratory: No shortness of breath, wheezing, or stridor Abdomen: No abdominal pain, no vomiting, No diarrhea Endocrine: No weight loss, No night sweats Extremities: No peripheral edema, cyanosis Skin: No rashes, easy bruising Neurologic: No focal weakness, trouble with speech or swollowing Urologic: No dysuria, Hematuria, or urinary frequency   Physical Exam:  ED Triage Vitals  Enc Vitals Group     BP 03/18/16 1357 114/74 mmHg     Pulse Rate 03/18/16 1357 88     Resp 03/18/16 1357 18      Temp 03/18/16 1357 99.3 F (37.4 C)     Temp Source 03/18/16 1357 Oral     SpO2 03/18/16 1357 100 %     Weight 03/18/16 1357 106 lb (48.081 kg)     Height 03/18/16 1357 5\' 2"  (1.575 m)     Head Cir --      Peak Flow --      Pain Score 03/18/16 1420 10     Pain Loc --      Pain Edu? --      Excl. in [place]? --     General: Awake , Alert , and Oriented times 3; GCS 15 Head: Normal cephalic , atraumatic Eyes: Pupils equal , round, reactive to light Nose/Throat: No nasal drainage, patent upper airway without erythema or exudate.  Neck: Supple, Full range of motion, No anterior adenopathy or palpable thyroid masses Lungs: Clear to ascultation without wheezes , rhonchi, or rales Heart: Regular rate, regular rhythm without murmurs , gallops , or rubs Abdomen: Soft, non tender without rebound, guarding , or rigidity; bowel sounds positive and symmetric in all 4 quadrants. No organomegaly .        Extremities: 2 plus symmetric pulses. No edema, clubbing or cyanosis Neurologic: normal ambulation, Motor symmetric without deficits, sensory intact Skin: warm, dry, no rashes Rectal exam with chaperone present shows guaiac negative stool with normal sphincter tone. No internal or external hemorrhoids are noted.  Labs:   All laboratory work was reviewed including any pertinent negatives or positives listed below:  Labs Reviewed  COMPREHENSIVE METABOLIC PANEL  CBC  POC OCCULT BLOOD, ED  TYPE AND SCREEN     ED Course:  I suspect that the patient's bleeding today was not from the rectal area but was likely a continuation of her bleeding with history of endometriosis etc. Patient was advised to follow up with her OB/GYN and informed them of her emergency department visit. Her blood pressure, heart rate, etc. appears to be within normal limits. The patient does not have any evidence of rectal bleeding. She should drink plenty of fluids. Assessment:  Endometriosis   Final Clinical Impression:    Final diagnoses:  Endometriosis     Plan: Outpatient Patient was advised to return immediately if condition worsens. Patient was advised to follow up with their primary care physician or other specialized physicians involved in their outpatient care. The patient and/or family member/power of attorney had laboratory results reviewed at the bedside. All questions and concerns were addressed and appropriate discharge instructions were distributed by the nursing  staff.             Daymon Larsen, MD 03/18/16 [phone]

## 2016-03-18 NOTE — ED Notes (Signed)
Pt reports that she had colonoscopy last Friday and she has been having rectal bleeding since then. She states that the amount of blood varies and that she has had severe abdominal pain since last night. Pt alert & oriented with NAD noted.

## 2016-03-19 ENCOUNTER — Emergency Department
Admission: EM | Admit: 2016-03-19 | Discharge: 2016-03-20 | Disposition: A | Discharge status: Home / Self Care | Payer: Medicare Other | Attending: Emergency Medicine | Admitting: Emergency Medicine

## 2016-03-19 ENCOUNTER — Emergency Department: Payer: Medicare Other

## 2016-03-19 DIAGNOSIS — E039 Hypothyroidism, unspecified: Secondary | ICD-10-CM | POA: Diagnosis not present

## 2016-03-19 DIAGNOSIS — R109 Unspecified abdominal pain: Secondary | ICD-10-CM

## 2016-03-19 DIAGNOSIS — Z79899 Other long term (current) drug therapy: Secondary | ICD-10-CM | POA: Insufficient documentation

## 2016-03-19 DIAGNOSIS — J45909 Unspecified asthma, uncomplicated: Secondary | ICD-10-CM | POA: Insufficient documentation

## 2016-03-19 DIAGNOSIS — R102 Pelvic and perineal pain: Secondary | ICD-10-CM | POA: Insufficient documentation

## 2016-03-19 DIAGNOSIS — N189 Chronic kidney disease, unspecified: Secondary | ICD-10-CM | POA: Insufficient documentation

## 2016-03-19 DIAGNOSIS — G8918 Other acute postprocedural pain: Secondary | ICD-10-CM | POA: Diagnosis present

## 2016-03-19 LAB — CBC WITH DIFFERENTIAL/PLATELET
Basophils Absolute: 0.1 10*3/uL (ref 0–0.1)
Basophils Relative: 1 %
Eosinophils Absolute: 0.2 10*3/uL (ref 0–0.7)
Eosinophils Relative: 3 %
HCT: 43.4 % (ref 35.0–47.0)
Hemoglobin: 14.3 g/dL (ref 12.0–16.0)
Lymphocytes Relative: 31 %
Lymphs Abs: 2.7 10*3/uL (ref 1.0–3.6)
MCH: 31.8 pg (ref 26.0–34.0)
MCHC: 33.1 g/dL (ref 32.0–36.0)
MCV: 96.3 fL (ref 80.0–100.0)
Monocytes Absolute: 0.5 10*3/uL (ref 0.2–0.9)
Monocytes Relative: 5 %
Neutro Abs: 5.2 10*3/uL (ref 1.4–6.5)
Neutrophils Relative %: 60 %
Platelets: 221 10*3/uL (ref 150–440)
RBC: 4.5 MIL/uL (ref 3.80–5.20)
RDW: 12.7 % (ref 11.5–14.5)
WBC: 8.6 10*3/uL (ref 3.6–11.0)

## 2016-03-19 MED ORDER — KETOROLAC TROMETHAMINE 30 MG/ML IJ SOLN
15.0000 mg | Freq: Once | INTRAMUSCULAR | Status: AC
Start: 2016-03-19 — End: 2016-03-19
  Administered 2016-03-19: 15 mg via INTRAVENOUS
  Filled 2016-03-19: qty 1

## 2016-03-19 NOTE — ED Notes (Signed)
Patient transported to Ultrasound 

## 2016-03-19 NOTE — ED Provider Notes (Signed)
[facility] Emergency Department Provider Note ____________________________________________  Time seen: Approximately 8:52 PM  I have reviewed the triage vital signs and the nursing notes.   HISTORY  Chief Complaint Post-op Problem  HPI Hannah Shaw is a 20 y.o. female who is post-op day 5 from a laparoscopic right ovarian cystectomy and presents to the ER due to abdominal pain; she is under the impression that she has internal bleeding and was told by Dr. Levora Angel to come to the ER. She reports subjective fevers and chills. She states she had been getting her menstrual period weekly prior to the surgery but currently is only having vaginal spotting. She has been eating and drinking normally. Her last bowel movement was 4 days ago. She is passing gas.  Last BM 5/22.    Past Medical History  Diagnosis Date  . Thyroid disease   . Endometriosis   . Hypothyroidism   . Asthma     WELL CONTROLLED  . Chronic kidney disease     H/O STONES  . GERD (gastroesophageal reflux disease)   . Headache     There are no active problems to display for this patient.   Past Surgical History  Procedure Laterality Date  . Appendectomy    . Endometrial biopsy    . Laparoscopy N/A 03/13/2016    Procedure: LAPAROSCOPY DIAGNOSTIC;  Surgeon: Honor Loh Ward, MD;  Location: [facility] ORS;  Service: Gynecology;  Laterality: N/A;    Current Outpatient Rx  Name  Route  Sig  Dispense  Refill  . clonazePAM (KLONOPIN) 1 MG tablet   Oral   Take 1 mg by mouth 2 (two) times daily as needed.         . cyclobenzaprine (FLEXERIL) 10 MG tablet   Oral   Take 1 tablet (10 mg total) by mouth 3 (three) times daily as needed for muscle spasms. Patient not taking: Reported on 03/13/2016   15 tablet   0   . ipratropium (ATROVENT HFA) 17 MCG/ACT inhaler   Inhalation   Inhale 2 puffs into the lungs every 6 (six) hours as needed.          Marland Kitchen levothyroxine (SYNTHROID, LEVOTHROID) 25 MCG  tablet   Oral   Take 25 mcg by mouth daily before breakfast.          . naproxen (NAPROSYN) 500 MG tablet   Oral   Take 1 tablet (500 mg total) by mouth 2 (two) times daily with a meal.   60 tablet   0   . ondansetron (ZOFRAN ODT) 4 MG disintegrating tablet   Oral   Take 1 tablet (4 mg total) by mouth every 8 (eight) hours as needed for nausea or vomiting. Patient not taking: Reported on 03/13/2016   20 tablet   0   . oxyCODONE-acetaminophen (PERCOCET) 5-325 MG tablet   Oral   Take 1 tablet by mouth every 4 (four) hours as needed for severe pain.         Marland Kitchen oxyCODONE-acetaminophen (ROXICET) 5-325 MG tablet   Oral   Take 1-2 tablets by mouth every 4 (four) hours as needed for moderate pain or severe pain.   11 tablet   0     Allergies Morphine and related; Fentanyl; Betadine; and Iodine  No family history on file.  Social History Social History  Substance Use Topics  . Smoking status: Never Smoker   . Smokeless tobacco: None  . Alcohol Use: No    Review of  Systems Constitutional: See history of present illness Eyes: No visual changes. ENT: No sore throat. Cardiovascular: Denies chest pain. Respiratory: Denies shortness of breath. Gastrointestinal: See history of present illness Genitourinary: Negative for dysuria. Musculoskeletal: Negative for back pain. Skin: Negative for rash. Neurological: Negative for headaches.  10-point ROS otherwise negative.  ____________________________________________   PHYSICAL EXAM:  VITAL SIGNS: ED Triage Vitals  Enc Vitals Group     BP 03/19/16 1951 122/94 mmHg     Pulse Rate 03/19/16 1951 107     Resp 03/19/16 1951 20     Temp 03/19/16 1951 99.8 F (37.7 C)     Temp Source 03/19/16 1951 Oral     SpO2 03/19/16 1951 100 %     Weight 03/19/16 1951 106 lb (48.081 kg)     Height 03/19/16 1951 5\' 2"  (1.575 m)     Head Cir --      Peak Flow --      Pain Score 03/19/16 2021 10     Pain Loc --      Pain Edu? --       Excl. in [place]? --    Constitutional: Alert and oriented. Well appearing and in no acute distress. Eyes: Conjunctivae are normal. PERRL. EOMI. Head: Atraumatic. Nose: No congestion/rhinnorhea. Mouth/Throat: Mucous membranes are moist.  Oropharynx non-erythematous. Neck: No stridor.   Cardiovascular: Normal rate, regular rhythm. Grossly normal heart sounds.  Good peripheral circulation. Respiratory: Normal respiratory effort.  No retractions. Lungs CTAB. Gastrointestinal: Incisions clean dry and intact; Soft and very minimally tender over incisions. No distention. No abdominal bruits. No CVA tenderness. Musculoskeletal: No lower extremity tenderness nor edema.   Neurologic:  Normal speech and language. No gross focal neurologic deficits are appreciated. No gait instability. Skin:  Skin is warm, dry and intact. No rash noted. Psychiatric: Mood and affect are normal. Speech and behavior are normal.  ____________________________________________   LABS (all labs ordered are listed, but only abnormal results are displayed)  Labs Reviewed  CBC WITH DIFFERENTIAL/PLATELET   ________________________________________________  RADIOLOGY  U/s pelvis: IMPRESSION: Unremarkable pelvic ultrasound. No evidence for ovarian torsion.   Electronically Signed By: Garald Balding M.D. On: 03/19/2016 22:34 ____________________________________________   INITIAL IMPRESSION / ASSESSMENT AND PLAN / ED COURSE  Pertinent labs & imaging results that were available during my care of the patient were reviewed by me and considered in my medical decision making (see chart for details).  On exam abdominal tenderness seems very minimal and appropriate for postop day 5. I am concerned that narcotics will worsen patient's constipation which is a likely factor in her abdominal pain. ----------------------------------------- 9:04 PM on 03/19/2016 ----------------------------------------- I spoke with Dr. Leonides Schanz.  Patient is mistaken; she had not been advised to come to the ER and is not thought to have internal bleeding. Her hemoglobin yesterday was 14. We will repeat her CBC today. While there is very low clinical suspicion, it is reasonable to do a pelvic ultrasound to ensure no bleeding.  She feels Toradol is a reasonable option.  ----------------------------------------- 11:39 PM on 03/19/2016 -----------------------------------------  Asian updated with labs and ultrasound results. She is feeling better and understands need to follow up outpatient. I will have her started on a bowel regimen.  ____________________________________________   FINAL CLINICAL IMPRESSION(S) / ED DIAGNOSES  Final diagnoses:  Pelvic pain in female      New prescriptions started this visit New Prescriptions   No medications on file     Ponciano Ort, MD 03/19/16 2339

## 2016-03-19 NOTE — ED Notes (Addendum)
PT reports she had laparoscopic surgery for endometriosis on 5/19. Pt c/o increased pain increasing beginning yesterday. Pt was seen in [facility] yesterday for same. Pt reports she called her [facility] today, and was told to come into ED to be seen. Pt c/o 10 out of 10 abdominal pain, rated at 10 out of 10 described as stabbing. Pt reports she has been unable to eat since Monday due to pain. Pt c/o N/V

## 2016-03-19 NOTE — Discharge Instructions (Signed)
It is very important to take MiraLAX over-the-counter according to the bottle instructions to help your bowels move; start this tonight. Constipation is most likely causing your belly pain. Return to the ER for new or worsening pain or for any other concerns.

## 2016-03-19 NOTE — ED Notes (Signed)
Patient had surgery last Friday for endometriosis and now states she is having pain because she is bleeding into her abdominal cavity. Abdomen soft, tender to palpation, and umbilical region is normal color with noted dried blood. States nausea and vomiting today x 4. Has been running a fever at home to 100.4. Took Tylenol. States her mother Levada Dy talked to her OB who did the surgery and told her to come over here due to the pain.

## 2016-03-19 NOTE — ED Notes (Signed)
Patient states they talked to her OB "who wanted her to return to the ED so she could possibly be admitted due to the active bleeding in her stomach."

## 2016-03-19 NOTE — ED Notes (Signed)
Patient given hospital telephone to call mother upon request.

## 2016-03-20 NOTE — ED Notes (Signed)
Pt requesting pain medication for abdominal pain. MD informed. MD reported pt could be given 1g tylenol. Pt informed. Pt refused.

## 2016-03-20 NOTE — ED Notes (Signed)
Reviewed d/c instructions, follow-up care, and use of OTC miralax with pt. Pt verbalized understanding.

## 2016-04-10 ENCOUNTER — Encounter: Payer: Self-pay | Admitting: Emergency Medicine

## 2016-04-10 ENCOUNTER — Emergency Department
Admission: EM | Admit: 2016-04-10 | Discharge: 2016-04-10 | Disposition: A | Discharge status: Home / Self Care | Payer: Medicare Other | Attending: Emergency Medicine | Admitting: Emergency Medicine

## 2016-04-10 ENCOUNTER — Emergency Department: Payer: Medicare Other

## 2016-04-10 DIAGNOSIS — N83209 Unspecified ovarian cyst, unspecified side: Secondary | ICD-10-CM

## 2016-04-10 DIAGNOSIS — E039 Hypothyroidism, unspecified: Secondary | ICD-10-CM | POA: Insufficient documentation

## 2016-04-10 DIAGNOSIS — N83202 Unspecified ovarian cyst, left side: Secondary | ICD-10-CM | POA: Insufficient documentation

## 2016-04-10 DIAGNOSIS — J45909 Unspecified asthma, uncomplicated: Secondary | ICD-10-CM | POA: Diagnosis not present

## 2016-04-10 DIAGNOSIS — N189 Chronic kidney disease, unspecified: Secondary | ICD-10-CM | POA: Insufficient documentation

## 2016-04-10 DIAGNOSIS — R102 Pelvic and perineal pain: Secondary | ICD-10-CM

## 2016-04-10 LAB — COMPREHENSIVE METABOLIC PANEL
ALT: 24 U/L (ref 14–54)
AST: 22 U/L (ref 15–41)
Albumin: 4.6 g/dL (ref 3.5–5.0)
Alkaline Phosphatase: 47 U/L (ref 38–126)
Anion gap: 9 (ref 5–15)
BUN: 8 mg/dL (ref 6–20)
CO2: 23 mmol/L (ref 22–32)
Calcium: 9 mg/dL (ref 8.9–10.3)
Chloride: 108 mmol/L (ref 101–111)
Creatinine, Ser: 0.67 mg/dL (ref 0.44–1.00)
GFR calc Af Amer: >60 mL/min (ref >60)
GFR calc non Af Amer: >60 mL/min (ref >60)
Glucose, Bld: 85 mg/dL (ref 65–99)
Potassium: 3.6 mmol/L (ref 3.5–5.1)
Sodium: 140 mmol/L (ref 135–145)
Total Bilirubin: 0.9 mg/dL (ref 0.3–1.2)
Total Protein: 7.7 g/dL (ref 6.5–8.1)

## 2016-04-10 LAB — CBC WITH DIFFERENTIAL/PLATELET
Basophils Absolute: 0.1 10*3/uL (ref 0–0.1)
Basophils Relative: 1 %
Eosinophils Absolute: 0.2 10*3/uL (ref 0–0.7)
Eosinophils Relative: 2 %
HCT: 39.6 % (ref 35.0–47.0)
Hemoglobin: 13.7 g/dL (ref 12.0–16.0)
Lymphocytes Relative: 35 %
Lymphs Abs: 3.1 10*3/uL (ref 1.0–3.6)
MCH: 32.1 pg (ref 26.0–34.0)
MCHC: 34.7 g/dL (ref 32.0–36.0)
MCV: 92.6 fL (ref 80.0–100.0)
Monocytes Absolute: 0.5 10*3/uL (ref 0.2–0.9)
Monocytes Relative: 6 %
Neutro Abs: 5 10*3/uL (ref 1.4–6.5)
Neutrophils Relative %: 56 %
Platelets: 242 10*3/uL (ref 150–440)
RBC: 4.28 MIL/uL (ref 3.80–5.20)
RDW: 12.1 % (ref 11.5–14.5)
WBC: 8.9 10*3/uL (ref 3.6–11.0)

## 2016-04-10 LAB — URINALYSIS COMPLETE WITH MICROSCOPIC (ARMC ONLY)
Bilirubin Urine: NEGATIVE
Glucose, UA: NEGATIVE mg/dL
Ketones, ur: NEGATIVE mg/dL
Nitrite: NEGATIVE
Protein, ur: NEGATIVE mg/dL
Specific Gravity, Urine: 1.017 (ref 1.005–1.030)
pH: 5 (ref 5.0–8.0)

## 2016-04-10 LAB — LIPASE, BLOOD: Lipase: 22 U/L (ref 11–51)

## 2016-04-10 LAB — POCT PREGNANCY, URINE: Preg Test, Ur: NEGATIVE

## 2016-04-10 NOTE — ED Provider Notes (Signed)
[facility] Emergency Department Provider Note    ____________________________________________  Time seen: ~1830  I have reviewed the triage vital signs and the nursing notes.   HISTORY  Chief Complaint Fever   History limited by: Not Limited   HPI Hannah Shaw is a 20 y.o. female who presents to the emergency department today because of pelvic pain and she says that both her primary care and OB/GYN doctor told her to come. She was at her primary care doctor's office 1 week ago when she was found to have a fever. She says at that time her doctor was concerned that she might have an infection related to a oncologic surgery last month. She then says that she called her OB/GYN doctor 3 days ago because the fevers and pain persisted. She was told that that time to come to the hospital by her OB/GYN doctor because she might need IV medication. It is unclear why the patient waited 3 days to present to the emergency department. Today the patient states she had a fever of 10 1 in the morning. Some nausea but no vomiting.   Past Medical History  Diagnosis Date  . Thyroid disease   . Endometriosis   . Hypothyroidism   . Asthma     WELL CONTROLLED  . Chronic kidney disease     H/O STONES  . GERD (gastroesophageal reflux disease)   . Headache     There are no active problems to display for this patient.   Past Surgical History  Procedure Laterality Date  . Appendectomy    . Endometrial biopsy    . Laparoscopy N/A 03/13/2016    Procedure: LAPAROSCOPY DIAGNOSTIC;  Surgeon: Honor Loh Ward, MD;  Location: [facility] ORS;  Service: Gynecology;  Laterality: N/A;    Current Outpatient Rx  Name  Route  Sig  Dispense  Refill  . clonazePAM (KLONOPIN) 1 MG tablet   Oral   Take 1 mg by mouth 2 (two) times daily as needed.         . cyclobenzaprine (FLEXERIL) 10 MG tablet   Oral   Take 1 tablet (10 mg total) by mouth 3 (three) times daily as needed for muscle  spasms. Patient not taking: Reported on 03/13/2016   15 tablet   0   . ipratropium (ATROVENT HFA) 17 MCG/ACT inhaler   Inhalation   Inhale 2 puffs into the lungs every 6 (six) hours as needed.          Marland Kitchen levothyroxine (SYNTHROID, LEVOTHROID) 25 MCG tablet   Oral   Take 25 mcg by mouth daily before breakfast.          . naproxen (NAPROSYN) 500 MG tablet   Oral   Take 1 tablet (500 mg total) by mouth 2 (two) times daily with a meal.   60 tablet   0   . ondansetron (ZOFRAN ODT) 4 MG disintegrating tablet   Oral   Take 1 tablet (4 mg total) by mouth every 8 (eight) hours as needed for nausea or vomiting. Patient not taking: Reported on 03/13/2016   20 tablet   0   . oxyCODONE-acetaminophen (PERCOCET) 5-325 MG tablet   Oral   Take 1 tablet by mouth every 4 (four) hours as needed for severe pain.         Marland Kitchen oxyCODONE-acetaminophen (ROXICET) 5-325 MG tablet   Oral   Take 1-2 tablets by mouth every 4 (four) hours as needed for moderate pain or severe pain.  11 tablet   0     Allergies Morphine and related; Fentanyl; Betadine; and Iodine  No family history on file.  Social History Social History  Substance Use Topics  . Smoking status: Never Smoker   . Smokeless tobacco: None  . Alcohol Use: No    Review of Systems  Constitutional: Negative for fever. Cardiovascular: Negative for chest pain. Respiratory: Negative for shortness of breath. Gastrointestinal: Negative for abdominal pain, vomiting and diarrhea.Positive for pelvic pain Neurological: Negative for headaches, focal weakness or numbness.  10-point ROS otherwise negative.  ____________________________________________   PHYSICAL EXAM:  VITAL SIGNS: ED Triage Vitals  Enc Vitals Group     BP 04/10/16 1655 122/78 mmHg     Pulse Rate 04/10/16 1655 97     Resp 04/10/16 1655 16     Temp 04/10/16 1655 99.7 F (37.6 C)     Temp Source 04/10/16 1655 Oral     SpO2 04/10/16 1655 100 %     Weight  04/10/16 1655 130 lb (58.968 kg)     Height 04/10/16 1655 5' (1.524 m)     Head Cir --      Peak Flow --      Pain Score 04/10/16 1656 9   Constitutional: Alert and oriented. Well appearing and in no distress. Eyes: Conjunctivae are normal. PERRL. Normal extraocular movements. ENT   Head: Normocephalic and atraumatic.   Nose: No congestion/rhinnorhea.   Mouth/Throat: Mucous membranes are moist.   Neck: No stridor. Hematological/Lymphatic/Immunilogical: No cervical lymphadenopathy. Cardiovascular: Normal rate, regular rhythm.  No murmurs, rubs, or gallops. Respiratory: Normal respiratory effort without tachypnea nor retractions. Breath sounds are clear and equal bilaterally. No wheezes/rales/rhonchi. Gastrointestinal: Soft and Minimally tender to palpation over the pelvic region. No rebound. No guarding.. No distention. There is no CVA tenderness. Genitourinary: Deferred Musculoskeletal: Normal range of motion in all extremities. No joint effusions.  No lower extremity tenderness nor edema. Neurologic:  Normal speech and language. No gross focal neurologic deficits are appreciated.  Skin:  Skin is warm, dry and intact. No rash noted. Psychiatric: Mood and affect are normal. Speech and behavior are normal. Patient exhibits appropriate insight and judgment.  ____________________________________________    LABS (pertinent positives/negatives)  Labs Reviewed  CBC WITH DIFFERENTIAL/PLATELET  COMPREHENSIVE METABOLIC PANEL  LIPASE, BLOOD  URINALYSIS COMPLETEWITH MICROSCOPIC ([facility] ONLY)  POC URINE PREG, ED     ____________________________________________   EKG  None  ____________________________________________    RADIOLOGY  US   IMPRESSION: 1. Simple left ovarian cyst measures 3.7 cm. No ovarian torsion, blood flow seen to the ovarian parenchyma. This is new from exam 1 month prior, and no further dedicated imaging follow-up is needed. 2. Normal sonographic  appearance of the right ovary and uterus.  ____________________________________________   PROCEDURES  Procedure(s) performed: None  Critical Care performed: No  ____________________________________________   INITIAL IMPRESSION / ASSESSMENT AND PLAN / ED COURSE  Pertinent labs & imaging results that were available during my care of the patient were reviewed by me and considered in my medical decision making (see chart for details).  Patient presented to the emergency department today because of concerns for pelvic pain. On exam some tenderness to palpation in the suprapubic region. [place] was performed which showed a left ovarian cyst. Think this likely could be the cause of the patients pain. No fever here although had a fever one week ago at PCPs office. Patient's UA with some WBCs. However patient without dysuria or odor to the urine.  Will plan on sending for culture.   ____________________________________________   FINAL CLINICAL IMPRESSION(S) / ED DIAGNOSES  Final diagnoses:  Pelvic pain in female  Cyst of ovary, unspecified laterality     Note: This dictation was prepared with Dragon dictation. Any transcriptional errors that result from this process are unintentional    Nance Pear, MD 04/10/16 2045

## 2016-04-10 NOTE — ED Notes (Signed)
Pt reports she has low abd pain.  Sx for 1 week.  Today pain worse.  Intermittent fever.  No n/v/d today.  Menses now.  No vag discharge.  No dysuria.  No back pain.  Pt alert.

## 2016-04-10 NOTE — ED Notes (Signed)
Patient transported to Ultrasound 

## 2016-04-10 NOTE — Discharge Instructions (Signed)
Please seek medical attention for any high fevers, chest pain, shortness of breath, change in behavior, persistent vomiting, bloody stool or any other new or concerning symptoms.   Pelvic Pain, Female Female pelvic pain can be caused by many different things and start from a variety of places. Pelvic pain refers to pain that is located in the lower half of the abdomen and between your hips. The pain may occur over a short period of time (acute) or may be reoccurring (chronic). The cause of pelvic pain may be related to disorders affecting the female reproductive organs (gynecologic), but it may also be related to the bladder, kidney stones, an intestinal complication, or muscle or skeletal problems. Getting help right away for pelvic pain is important, especially if there has been severe, sharp, or a sudden onset of unusual pain. It is also important to get help right away because some types of pelvic pain can be life threatening.  CAUSES  Below are only some of the causes of pelvic pain. The causes of pelvic pain can be in one of several categories.   Gynecologic.  Pelvic inflammatory disease.  Sexually transmitted infection.  Ovarian cyst or a twisted ovarian ligament (ovarian torsion).  Uterine lining that grows outside the uterus (endometriosis).  Fibroids, cysts, or tumors.  Ovulation.  Pregnancy.  Pregnancy that occurs outside the uterus (ectopic pregnancy).  Miscarriage.  Labor.  Abruption of the placenta or ruptured uterus.  Infection.  Uterine infection (endometritis).  Bladder infection.  Diverticulitis.  Miscarriage related to a uterine infection (septic abortion).  Bladder.  Inflammation of the bladder (cystitis).  Kidney stone(s).  Gastrointestinal.  Constipation.  Diverticulitis.  Neurologic.  Trauma.  Feeling pelvic pain because of mental or emotional causes (psychosomatic).  Cancers of the bowel or pelvis. EVALUATION  Your caregiver will  want to take a careful history of your concerns. This includes recent changes in your health, a careful gynecologic history of your periods (menses), and a sexual history. Obtaining your family history and medical history is also important. Your caregiver may suggest a pelvic exam. A pelvic exam will help identify the location and severity of the pain. It also helps in the evaluation of which organ system may be involved. In order to identify the cause of the pelvic pain and be properly treated, your caregiver may order tests. These tests may include:   A pregnancy test.  Pelvic ultrasonography.  An X-ray exam of the abdomen.  A urinalysis or evaluation of vaginal discharge.  Blood tests. HOME CARE INSTRUCTIONS   Only take over-the-counter or prescription medicines for pain, discomfort, or fever as directed by your caregiver.   Rest as directed by your caregiver.   Eat a balanced diet.   Drink enough fluids to make your urine clear or pale yellow, or as directed.   Avoid sexual intercourse if it causes pain.   Apply warm or cold compresses to the lower abdomen depending on which one helps the pain.   Avoid stressful situations.   Keep a journal of your pelvic pain. Write down when it started, where the pain is located, and if there are things that seem to be associated with the pain, such as food or your menstrual cycle.  Follow up with your caregiver as directed.  SEEK MEDICAL CARE IF:  Your medicine does not help your pain.  You have abnormal vaginal discharge. SEEK IMMEDIATE MEDICAL CARE IF:   You have heavy bleeding from the vagina.   Your pelvic pain increases.  You feel light-headed or faint.   You have chills.   You have pain with urination or blood in your urine.   You have uncontrolled diarrhea or vomiting.   You have a fever or persistent symptoms for more than 3 days.  You have a fever and your symptoms suddenly get worse.   You are  being physically or sexually abused.   This information is not intended to replace advice given to you by your health care provider. Make sure you discuss any questions you have with your health care provider.   Document Released: 09/08/2004 Document Revised: 07/03/2015 Document Reviewed: 02/01/2012 Elsevier Interactive Patient Education [employer].

## 2016-04-10 NOTE — ED Notes (Signed)
Reports nausea and dizziness, seen at her MD office today and had fever of 101.3.  States she had recent surgery for endometriosis and told to come here to eval for infection.

## 2016-04-13 LAB — URINE CULTURE: Culture: 50000 — AB

## 2016-04-24 ENCOUNTER — Emergency Department
Admission: EM | Admit: 2016-04-24 | Discharge: 2016-04-24 | Disposition: A | Discharge status: Home / Self Care | Payer: Medicare Other | Attending: Emergency Medicine | Admitting: Emergency Medicine

## 2016-04-24 ENCOUNTER — Emergency Department: Payer: Medicare Other

## 2016-04-24 ENCOUNTER — Encounter: Payer: Self-pay | Admitting: Emergency Medicine

## 2016-04-24 DIAGNOSIS — Y939 Activity, unspecified: Secondary | ICD-10-CM | POA: Diagnosis not present

## 2016-04-24 DIAGNOSIS — Y999 Unspecified external cause status: Secondary | ICD-10-CM | POA: Diagnosis not present

## 2016-04-24 DIAGNOSIS — J45909 Unspecified asthma, uncomplicated: Secondary | ICD-10-CM | POA: Insufficient documentation

## 2016-04-24 DIAGNOSIS — M25572 Pain in left ankle and joints of left foot: Secondary | ICD-10-CM | POA: Diagnosis present

## 2016-04-24 DIAGNOSIS — Z79899 Other long term (current) drug therapy: Secondary | ICD-10-CM | POA: Diagnosis not present

## 2016-04-24 DIAGNOSIS — S93402A Sprain of unspecified ligament of left ankle, initial encounter: Secondary | ICD-10-CM

## 2016-04-24 DIAGNOSIS — Y929 Unspecified place or not applicable: Secondary | ICD-10-CM | POA: Diagnosis not present

## 2016-04-24 DIAGNOSIS — W1849XA Other slipping, tripping and stumbling without falling, initial encounter: Secondary | ICD-10-CM | POA: Diagnosis not present

## 2016-04-24 DIAGNOSIS — E039 Hypothyroidism, unspecified: Secondary | ICD-10-CM | POA: Diagnosis not present

## 2016-04-24 DIAGNOSIS — N189 Chronic kidney disease, unspecified: Secondary | ICD-10-CM | POA: Diagnosis not present

## 2016-04-24 MED ORDER — TRAMADOL HCL 50 MG PO TABS: 50.0000 mg | ORAL_TABLET | Freq: Four times a day (QID) | ORAL | Status: DC | PRN

## 2016-04-24 MED ORDER — ETODOLAC 400 MG PO TABS: 400.0000 mg | ORAL_TABLET | Freq: Two times a day (BID) | ORAL | Status: DC

## 2016-04-24 NOTE — ED Provider Notes (Signed)
[facility] Emergency Department Provider Note ____________________________________________  Time seen: Approximately 1:33 PM  I have reviewed the triage vital signs and the nursing notes.   HISTORY  Chief Complaint Ankle Pain    HPI Hannah Shaw is a 20 y.o. female who presents to the emergency department for evaluation of left ankle pain after tripping yesterday. She denies striking her head or loss of consciousness. She has not taken anything for pain.  Past Medical History  Diagnosis Date  . Thyroid disease   . Endometriosis   . Hypothyroidism   . Asthma     WELL CONTROLLED  . Chronic kidney disease     H/O STONES  . GERD (gastroesophageal reflux disease)   . Headache     There are no active problems to display for this patient.   Past Surgical History  Procedure Laterality Date  . Appendectomy    . Endometrial biopsy    . Laparoscopy N/A 03/13/2016    Procedure: LAPAROSCOPY DIAGNOSTIC;  Surgeon: Honor Loh Ward, MD;  Location: [facility] ORS;  Service: Gynecology;  Laterality: N/A;    Current Outpatient Rx  Name  Route  Sig  Dispense  Refill  . clonazePAM (KLONOPIN) 1 MG tablet   Oral   Take 1 mg by mouth 2 (two) times daily as needed.         . cyclobenzaprine (FLEXERIL) 10 MG tablet   Oral   Take 1 tablet (10 mg total) by mouth 3 (three) times daily as needed for muscle spasms. Patient not taking: Reported on 03/13/2016   15 tablet   0   . etodolac (LODINE) 400 MG tablet   Oral   Take 1 tablet (400 mg total) by mouth 2 (two) times daily.   60 tablet   0   . ipratropium (ATROVENT HFA) 17 MCG/ACT inhaler   Inhalation   Inhale 2 puffs into the lungs every 6 (six) hours as needed.          Marland Kitchen levothyroxine (SYNTHROID, LEVOTHROID) 25 MCG tablet   Oral   Take 25 mcg by mouth daily before breakfast.          . ondansetron (ZOFRAN ODT) 4 MG disintegrating tablet   Oral   Take 1 tablet (4 mg total) by mouth every 8 (eight)  hours as needed for nausea or vomiting. Patient not taking: Reported on 03/13/2016   20 tablet   0   . traMADol (ULTRAM) 50 MG tablet   Oral   Take 1 tablet (50 mg total) by mouth every 6 (six) hours as needed.   12 tablet   0     Allergies Morphine and related; Fentanyl; Betadine; and Iodine  No family history on file.  Social History Social History  Substance Use Topics  . Smoking status: Never Smoker   . Smokeless tobacco: None  . Alcohol Use: No    Review of Systems Constitutional: No recent illness. Cardiovascular: Denies chest pain or palpitations. Respiratory: Denies shortness of breath. Musculoskeletal: Pain in left ankle. Skin: Negative for rash, wound, lesion. Neurological: Negative for focal weakness or numbness.  ____________________________________________   PHYSICAL EXAM:  VITAL SIGNS: ED Triage Vitals  Enc Vitals Group     BP 04/24/16 1252 106/63 mmHg     Pulse Rate 04/24/16 1252 96     Resp 04/24/16 1252 16     Temp 04/24/16 1252 100.3 F (37.9 C)     Temp Source 04/24/16 1252 Oral  SpO2 04/24/16 1252 100 %     Weight 04/24/16 1252 107 lb (48.535 kg)     Height 04/24/16 1252 5\' 2"  (1.575 m)     Head Cir --      Peak Flow --      Pain Score 04/24/16 1238 10     Pain Loc --      Pain Edu? --      Excl. in [place]? --     Constitutional: Alert and oriented. Well appearing and in no acute distress. Eyes: Conjunctivae are normal. EOMI. Head: Atraumatic. Neck: No stridor.  Respiratory: Normal respiratory effort.   Musculoskeletal: ATFL pattern tenderness of the left foot/ankle with minimal if any swelling and no ecchymosis. Neurologic:  Normal speech and language. No gross focal neurologic deficits are appreciated. Speech is normal. No gait instability. Skin:  Skin is warm, dry and intact. Atraumatic. Psychiatric: Mood and affect are normal. Speech and behavior are normal.  ____________________________________________   LABS (all labs  ordered are listed, but only abnormal results are displayed)  Labs Reviewed - No data to display ____________________________________________  RADIOLOGY  Left ankle negative for acute bony abnormality per radiology. ____________________________________________   PROCEDURES  Procedure(s) performed:   ACE applied to the left ankle and crutches given. Neurovascularly intact post application.   ____________________________________________   INITIAL IMPRESSION / ASSESSMENT AND PLAN / ED COURSE  Pertinent labs & imaging results that were available during my care of the patient were reviewed by me and considered in my medical decision making (see chart for details).  Patient was advised follow-up with orthopedics for symptoms that are not improving over the week. She was instructed to return to the emergency department for symptoms that change or worsen if she is unable schedule an appointment. ____________________________________________   FINAL CLINICAL IMPRESSION(S) / ED DIAGNOSES  Final diagnoses:  Ankle sprain, left, initial encounter       Victorino Dike, FNP 04/24/16 1339  Earleen Newport, MD 04/24/16 [phone]

## 2016-04-24 NOTE — ED Notes (Signed)
See triage note  Tripped yesterday   Having pain to left ankle min swelling positive pulses

## 2016-04-24 NOTE — Discharge Instructions (Signed)
Ankle Sprain  An ankle sprain is an injury to the strong, fibrous tissues (ligaments) that hold the bones of your ankle joint together.   CAUSES  An ankle sprain is usually caused by a fall or by twisting your ankle. Ankle sprains most commonly occur when you step on the outer edge of your foot, and your ankle turns inward. People who participate in sports are more prone to these types of injuries.   SYMPTOMS    Pain in your ankle. The pain may be present at rest or only when you are trying to stand or walk.   Swelling.   Bruising. Bruising may develop immediately or within 1 to 2 days after your injury.   Difficulty standing or walking, particularly when turning corners or changing directions.  DIAGNOSIS   Your caregiver will ask you details about your injury and perform a physical exam of your ankle to determine if you have an ankle sprain. During the physical exam, your caregiver will press on and apply pressure to specific areas of your foot and ankle. Your caregiver will try to move your ankle in certain ways. An X-ray exam may be done to be sure a bone was not broken or a ligament did not separate from one of the bones in your ankle (avulsion fracture).   TREATMENT   Certain types of braces can help stabilize your ankle. Your caregiver can make a recommendation for this. Your caregiver may recommend the use of medicine for pain. If your sprain is severe, your caregiver may refer you to a surgeon who helps to restore function to parts of your skeletal system (orthopedist) or a physical therapist.  HOME CARE INSTRUCTIONS    Apply ice to your injury for 1-2 days or as directed by your caregiver. Applying ice helps to reduce inflammation and pain.    Put ice in a plastic bag.    Place a towel between your skin and the bag.    Leave the ice on for 15-20 minutes at a time, every 2 hours while you are awake.   Only take over-the-counter or prescription medicines for pain, discomfort, or fever as directed by  your caregiver.   Elevate your injured ankle above the level of your heart as much as possible for 2-3 days.   If your caregiver recommends crutches, use them as instructed. Gradually put weight on the affected ankle. Continue to use crutches or a cane until you can walk without feeling pain in your ankle.   If you have a plaster splint, wear the splint as directed by your caregiver. Do not rest it on anything harder than a pillow for the first 24 hours. Do not put weight on it. Do not get it wet. You may take it off to take a shower or bath.   You may have been given an elastic bandage to wear around your ankle to provide support. If the elastic bandage is too tight (you have numbness or tingling in your foot or your foot becomes cold and blue), adjust the bandage to make it comfortable.   If you have an air splint, you may blow more air into it or let air out to make it more comfortable. You may take your splint off at night and before taking a shower or bath. Wiggle your toes in the splint several times per day to decrease swelling.  SEEK MEDICAL CARE IF:    You have rapidly increasing bruising or swelling.   Your toes feel   extremely cold or you lose feeling in your foot.   Your pain is not relieved with medicine.  SEEK IMMEDIATE MEDICAL CARE IF:   Your toes are numb or blue.   You have severe pain that is increasing.  MAKE SURE YOU:    Understand these instructions.   Will watch your condition.   Will get help right away if you are not doing well or get worse.     This information is not intended to replace advice given to you by your health care provider. Make sure you discuss any questions you have with your health care provider.     Document Released: 10/12/2005 Document Revised: 11/02/2014 Document Reviewed: 10/24/2011  Elsevier Interactive Patient Education 2016 [employer]

## 2016-04-24 NOTE — ED Notes (Signed)
Patient states she tripped yesterday and has had left ankle pain since that time.  Patient denies passing out or hitting her head.  Patient denies other pain.

## 2016-05-26 ENCOUNTER — Emergency Department
Admission: EM | Admit: 2016-05-26 | Discharge: 2016-05-27 | Disposition: A | Discharge status: Home / Self Care | Payer: Medicare Other | Attending: Emergency Medicine | Admitting: Emergency Medicine

## 2016-05-26 ENCOUNTER — Emergency Department: Payer: Medicare Other

## 2016-05-26 DIAGNOSIS — J45909 Unspecified asthma, uncomplicated: Secondary | ICD-10-CM | POA: Insufficient documentation

## 2016-05-26 DIAGNOSIS — R102 Pelvic and perineal pain: Secondary | ICD-10-CM | POA: Diagnosis present

## 2016-05-26 DIAGNOSIS — E039 Hypothyroidism, unspecified: Secondary | ICD-10-CM | POA: Diagnosis not present

## 2016-05-26 DIAGNOSIS — N189 Chronic kidney disease, unspecified: Secondary | ICD-10-CM | POA: Insufficient documentation

## 2016-05-26 LAB — BASIC METABOLIC PANEL
Anion gap: 9 (ref 5–15)
BUN: 8 mg/dL (ref 6–20)
CO2: 26 mmol/L (ref 22–32)
Calcium: 9.5 mg/dL (ref 8.9–10.3)
Chloride: 102 mmol/L (ref 101–111)
Creatinine, Ser: 0.55 mg/dL (ref 0.44–1.00)
GFR calc Af Amer: >60 mL/min (ref >60)
GFR calc non Af Amer: >60 mL/min (ref >60)
Glucose, Bld: 94 mg/dL (ref 65–99)
Potassium: 3.7 mmol/L (ref 3.5–5.1)
Sodium: 137 mmol/L (ref 135–145)

## 2016-05-26 LAB — URINALYSIS COMPLETE WITH MICROSCOPIC (ARMC ONLY)
Bilirubin Urine: NEGATIVE
Glucose, UA: NEGATIVE mg/dL
Ketones, ur: NEGATIVE mg/dL
Nitrite: NEGATIVE
Protein, ur: NEGATIVE mg/dL
Specific Gravity, Urine: 1.019 (ref 1.005–1.030)
pH: 5 (ref 5.0–8.0)

## 2016-05-26 LAB — CBC
HCT: 41.1 % (ref 35.0–47.0)
Hemoglobin: 14.8 g/dL (ref 12.0–16.0)
MCH: 32.5 pg (ref 26.0–34.0)
MCHC: 35.9 g/dL (ref 32.0–36.0)
MCV: 90.6 fL (ref 80.0–100.0)
Platelets: 257 10*3/uL (ref 150–440)
RBC: 4.54 MIL/uL (ref 3.80–5.20)
RDW: 11.8 % (ref 11.5–14.5)
WBC: 10.3 10*3/uL (ref 3.6–11.0)

## 2016-05-26 LAB — POCT PREGNANCY, URINE: Preg Test, Ur: NEGATIVE

## 2016-05-26 MED ORDER — KETOROLAC TROMETHAMINE 30 MG/ML IJ SOLN
30.0000 mg | Freq: Once | INTRAMUSCULAR | Status: AC
  Administered 2016-05-27: 30 mg via INTRAVENOUS
  Filled 2016-05-26: qty 1

## 2016-05-26 MED ORDER — ONDANSETRON HCL 4 MG/2ML IJ SOLN
4.0000 mg | Freq: Once | INTRAMUSCULAR | Status: AC
  Administered 2016-05-27: 4 mg via INTRAVENOUS
  Filled 2016-05-26: qty 2

## 2016-05-26 MED ORDER — SODIUM CHLORIDE 0.9 % IV BOLUS (SEPSIS)
1000.0000 mL | Freq: Once | INTRAVENOUS | Status: AC
  Administered 2016-05-27: 1000 mL via INTRAVENOUS

## 2016-05-26 NOTE — ED Notes (Signed)
Patient transported to CT 

## 2016-05-26 NOTE — ED Provider Notes (Signed)
[facility] Emergency Department Provider Note  ____________________________________________   First MD Initiated Contact with Patient 05/26/16 2336     (approximate)  I have reviewed the triage vital signs and the nursing notes.   HISTORY  Chief Complaint Abdominal Pain    HPI LILAS SPAR is a 20 y.o. female presents with right pelvic pain for "few days". Patient patient denies any fever no dysuria no hematuria. Patient denies any vaginal discharge. Patient denies any diarrhea or constipation or vomiting.  Past Medical History:  Diagnosis Date  . Asthma    WELL CONTROLLED  . Chronic kidney disease    H/O STONES  . Endometriosis   . GERD (gastroesophageal reflux disease)   . Headache   . Hypothyroidism   . Thyroid disease     There are no active problems to display for this patient.   Past Surgical History:  Procedure Laterality Date  . APPENDECTOMY    . ENDOMETRIAL BIOPSY    . LAPAROSCOPY N/A 03/13/2016   Procedure: LAPAROSCOPY DIAGNOSTIC;  Surgeon: Honor Loh Ward, MD;  Location: [facility] ORS;  Service: Gynecology;  Laterality: N/A;    Prior to Admission medications   Medication Sig Start Date End Date Taking? Authorizing Provider  clonazePAM (KLONOPIN) 1 MG tablet Take 1 mg by mouth 2 (two) times daily as needed.    Historical Provider, MD  cyclobenzaprine (FLEXERIL) 10 MG tablet Take 1 tablet (10 mg total) by mouth 3 (three) times daily as needed for muscle spasms. Patient not taking: Reported on 03/13/2016 02/15/16   Charline Bills Cuthriell, PA-C  etodolac (LODINE) 400 MG tablet Take 1 tablet (400 mg total) by mouth 2 (two) times daily. 05/27/16   Gregor Hams, MD  ipratropium (ATROVENT HFA) 17 MCG/ACT inhaler Inhale 2 puffs into the lungs every 6 (six) hours as needed.     Historical Provider, MD  levothyroxine (SYNTHROID, LEVOTHROID) 25 MCG tablet Take 25 mcg by mouth daily before breakfast.     Historical Provider, MD  ondansetron  (ZOFRAN ODT) 4 MG disintegrating tablet Take 1 tablet (4 mg total) by mouth every 8 (eight) hours as needed for nausea or vomiting. Patient not taking: Reported on 03/13/2016 09/25/15   Loney Hering, MD  traMADol (ULTRAM) 50 MG tablet Take 1 tablet (50 mg total) by mouth every 6 (six) hours as needed. 04/24/16   Victorino Dike, FNP    Allergies Morphine and related; Fentanyl; Betadine [povidone iodine]; and Iodine  No family history on file.  Social History Social History  Substance Use Topics  . Smoking status: Never Smoker  . Smokeless tobacco: Not on file  . Alcohol use No    Review of Systems Constitutional: No fever/chills Eyes: No visual changes. ENT: No sore throat. Cardiovascular: Denies chest pain. Respiratory: Denies shortness of breath. Gastrointestinal: No abdominal pain.  No nausea, no vomiting.  No diarrhea.  No constipation. Genitourinary: Negative for dysuria.Positive for pelvic Musculoskeletal: Negative for back pain. Skin: Negative for rash. Neurological: Negative for headaches, focal weakness or numbness.  10-point ROS otherwise negative.  ____________________________________________   PHYSICAL EXAM:  VITAL SIGNS: ED Triage Vitals  Enc Vitals Group     BP 05/26/16 2230 113/86     Pulse Rate 05/26/16 2231 81     Resp --      Temp 05/26/16 2231 98.5 F (36.9 C)     Temp src --      SpO2 05/26/16 2231 100 %  Weight --      Height --      Head Circumference --      Peak Flow --      Pain Score 05/26/16 2117 10     Pain Loc --      Pain Edu? --      Excl. in [place]? --     Constitutional: Alert and oriented. Well appearing and in no acute distress. Eyes: Conjunctivae are normal. PERRL. EOMI. Head: Atraumatic. Ears:  Healthy appearing ear canals and TMs bilaterally Nose: No congestion/rhinnorhea. Mouth/Throat: Mucous membranes are moist.  Oropharynx non-erythematous. Neck: No stridor.  No meningeal signs.  Cardiovascular: Normal rate,  regular rhythm. Good peripheral circulation. Grossly normal heart sounds.   Respiratory: Normal respiratory effort.  No retractions. Lungs CTAB. Gastrointestinal: Generalized abdominal pain with very very mild palpation No distention.  Genitourinary:  Musculoskeletal: No lower extremity tenderness nor edema. No gross deformities of extremities. Neurologic:  Normal speech and language. No gross focal neurologic deficits are appreciated.  Skin:  Skin is warm, dry and intact. No rash noted. Psychiatric: Mood and affect are normal. Speech and behavior are normal.  ____________________________________________   LABS (all labs ordered are listed, but only abnormal results are displayed)  Labs Reviewed  URINALYSIS COMPLETEWITH MICROSCOPIC ([place]) - Abnormal; Notable for the following:       Result Value   Color, Urine YELLOW (*)    APPearance CLEAR (*)    Hgb urine dipstick 2+ (*)    Leukocytes, UA TRACE (*)    Bacteria, UA RARE (*)    Squamous Epithelial / LPF 0-5 (*)    All other components within normal limits  CBC  BASIC METABOLIC PANEL  POC URINE PREG, ED  POCT PREGNANCY, URINE    RADIOLOGY I, [facility] Temkin, personally viewed and evaluated these images (plain radiographs) as part of my medical decision making, as well as reviewing the written report by the radiologist.  Ct Renal Stone Study  Result Date: 05/27/2016 CLINICAL DATA:  RIGHT-sided abdominal pain for a few days, vomiting. History of endometriosis. EXAM: CT ABDOMEN AND PELVIS WITHOUT CONTRAST TECHNIQUE: Multidetector CT imaging of the abdomen and pelvis was performed following the standard protocol without IV contrast. COMPARISON:  Pelvic ultrasound April 10, 2006 FINDINGS: LUNG BASES: Included view of the lung bases are clear. The visualized heart and pericardium are unremarkable. KIDNEYS/BLADDER: Kidneys are orthotopic, demonstrating normal size and morphology. No nephrolithiasis, hydronephrosis; limited assessment  for renal masses on this nonenhanced examination. The unopacified ureters are normal in course and caliber. Urinary bladder is partially distended and unremarkable. SOLID ORGANS: The liver is diffusely hypodense compatible with steatosis with mild focal fatty sparing about the gallbladder fossa. Spleen, gallbladder, pancreas and adrenal glands are unremarkable for this non-contrast examination. GASTROINTESTINAL TRACT: The stomach, small and large bowel are normal in course and caliber without inflammatory changes, the sensitivity may be decreased by lack of enteric contrast. Mild amount of retained large bowel stool. Status post suspected appendectomy. PERITONEUM/RETROPERITONEUM: Aortoiliac vessels are normal in course and caliber. No lymphadenopathy by CT size criteria. Internal reproductive organs are unremarkable. No intraperitoneal free fluid nor free air. SOFT TISSUES/ OSSEOUS STRUCTURES: Nonsuspicious. Small fat containing umbilical hernia. IMPRESSION: No acute intra-abdominal/ pelvic process. Hepatic steatosis. Electronically Signed   By: Elon Alas M.D.   On: 05/27/2016 00:19    ____________________________________________   PROCEDURES  Procedure(s) performed:   Procedures   ____________________________________________   INITIAL IMPRESSION / ASSESSMENT AND PLAN / ED  COURSE  Pertinent labs & imaging results that were available during my care of the patient were reviewed by me and considered in my medical decision making (see chart for details).  I return to the patient to inform her of the CT scan findings. Patient in no apparent distress busy on her phone. I instructed the patient had a CT scan revealed no gross abnormalities and urged follow-up with OB/GYN. Patient will be discharged discharge home with follow-up with Dr. Glennon Mac  Clinical Course    ____________________________________________  FINAL CLINICAL IMPRESSION(S) / ED DIAGNOSES  Final diagnoses:  Pelvic pain  in female     MEDICATIONS GIVEN DURING THIS VISIT:  Medications  ketorolac (TORADOL) 30 MG/ML injection 30 mg (30 mg Intravenous Given 05/27/16 0014)  ondansetron (ZOFRAN) injection 4 mg (4 mg Intravenous Given 05/27/16 0014)  sodium chloride 0.9 % bolus 1,000 mL (0 mLs Intravenous Stopped 05/27/16 0053)     NEW OUTPATIENT MEDICATIONS STARTED DURING THIS VISIT:  Current Discharge Medication List        Note:  This document was prepared using Dragon voice recognition software and may include unintentional dictation errors.    Gregor Hams, MD 05/27/16 0100

## 2016-05-26 NOTE — ED Triage Notes (Signed)
Pt In with co right sided abd pain for few days no hx of the same, has had vomiting no diarrhea. Denies any dysuria at this time.

## 2016-05-27 DIAGNOSIS — R102 Pelvic and perineal pain: Secondary | ICD-10-CM | POA: Diagnosis not present

## 2016-05-27 MED ORDER — ETODOLAC 400 MG PO TABS: 400.0000 mg | ORAL_TABLET | Freq: Two times a day (BID) | ORAL | 0 refills | Status: DC

## 2016-05-27 NOTE — ED Notes (Signed)

## 2016-05-27 NOTE — ED Notes (Signed)
Pt. Talking on nurses phone, upset that her phone will not let her send texts out.

## 2016-07-04 ENCOUNTER — Emergency Department (HOSPITAL_COMMUNITY)
Admission: EM | Admit: 2016-07-04 | Discharge: 2016-07-05 | Disposition: A | Discharge status: Home / Self Care | Payer: Medicare Other | Attending: Emergency Medicine | Admitting: Emergency Medicine

## 2016-07-04 ENCOUNTER — Encounter (HOSPITAL_COMMUNITY): Payer: Self-pay

## 2016-07-04 DIAGNOSIS — R42 Dizziness and giddiness: Secondary | ICD-10-CM | POA: Insufficient documentation

## 2016-07-04 DIAGNOSIS — R112 Nausea with vomiting, unspecified: Secondary | ICD-10-CM | POA: Diagnosis not present

## 2016-07-04 DIAGNOSIS — R1030 Lower abdominal pain, unspecified: Secondary | ICD-10-CM | POA: Diagnosis present

## 2016-07-04 DIAGNOSIS — Y92009 Unspecified place in unspecified non-institutional (private) residence as the place of occurrence of the external cause: Secondary | ICD-10-CM | POA: Insufficient documentation

## 2016-07-04 DIAGNOSIS — Y9301 Activity, walking, marching and hiking: Secondary | ICD-10-CM | POA: Diagnosis not present

## 2016-07-04 DIAGNOSIS — R51 Headache: Secondary | ICD-10-CM | POA: Diagnosis not present

## 2016-07-04 DIAGNOSIS — R197 Diarrhea, unspecified: Secondary | ICD-10-CM | POA: Insufficient documentation

## 2016-07-04 DIAGNOSIS — W19XXXA Unspecified fall, initial encounter: Secondary | ICD-10-CM

## 2016-07-04 DIAGNOSIS — Y999 Unspecified external cause status: Secondary | ICD-10-CM | POA: Diagnosis not present

## 2016-07-04 DIAGNOSIS — M542 Cervicalgia: Secondary | ICD-10-CM | POA: Insufficient documentation

## 2016-07-04 DIAGNOSIS — M549 Dorsalgia, unspecified: Secondary | ICD-10-CM | POA: Diagnosis not present

## 2016-07-04 DIAGNOSIS — E039 Hypothyroidism, unspecified: Secondary | ICD-10-CM | POA: Insufficient documentation

## 2016-07-04 DIAGNOSIS — Z79899 Other long term (current) drug therapy: Secondary | ICD-10-CM | POA: Insufficient documentation

## 2016-07-04 DIAGNOSIS — R0602 Shortness of breath: Secondary | ICD-10-CM | POA: Diagnosis not present

## 2016-07-04 DIAGNOSIS — W06XXXA Fall from bed, initial encounter: Secondary | ICD-10-CM | POA: Insufficient documentation

## 2016-07-04 DIAGNOSIS — N189 Chronic kidney disease, unspecified: Secondary | ICD-10-CM | POA: Diagnosis not present

## 2016-07-04 DIAGNOSIS — J45909 Unspecified asthma, uncomplicated: Secondary | ICD-10-CM | POA: Insufficient documentation

## 2016-07-04 LAB — PREGNANCY, URINE: Preg Test, Ur: NEGATIVE

## 2016-07-04 MED ORDER — ACETAMINOPHEN 325 MG PO TABS
650.0000 mg | ORAL_TABLET | Freq: Once | ORAL | Status: AC
  Administered 2016-07-04: 650 mg via ORAL
  Filled 2016-07-04: qty 2

## 2016-07-04 NOTE — ED Triage Notes (Signed)
Patient states she fell forward off a bed onto her stomach. C/o back pain and abdominal pain. Patient states she took percocet after she fell. Also states she took a home pregnancy test and it was positive.

## 2016-07-04 NOTE — ED Provider Notes (Signed)
[facility] DEPT Provider Note   CSN: [id number] Arrival date & time: 07/04/16  2253  By signing my name below, I, Higinio Plan, attest that this documentation has been prepared under the direction and in the presence of Rolland Porter, MD . Electronically Signed: Higinio Plan, Scribe. 07/04/2016. 11:28 PM.  Time seen 23:10 PM  History   Chief Complaint Chief Complaint  Patient presents with  . Fall   The history is provided by the patient. No language interpreter was used.   HPI Comments: Hannah Shaw is a 20 y.o. female who presents to the Emergency Department complaining of gradually worsening, abdominal pain s/p a fall that occurred at 8:30 PM this evening. Pt reports she began to feel dizzy this evening and began walking towards her bed when she fell forward landing on her abdomen and striking her forehead. She also states she was trying to sit on the bed when she fell. Her story of what happened is inconsistent. She notes she "almost passed out" and experienced shortness of breath; however, she denies loss of consciousness. She notes associated back, neck and left arm pain and right sided headache; however, she states hx of chronic back pain. She also reports nausea, vomiting and diarrhea that began before her fall tonight. She states she has ~4 episodes of vomiting and diarrhea a day. Pt reports she was experiencing mild abdominal pain this evening before her fall due to  diagnosis of right ovarian cysts and endometriosis but notes it became progressively worse after her fall. She states she took percocet that was prescribed by her OB/GYN for endometriosis to relieve her pain with mild relief. Pt reports  she has been trying to conceive and took a pregnancy test tonight that was positive; she notes this is her first pregnancy. She states she has been trying to get pregnant. She states her last normal menstrual period began on 05/26/16. She denies bleeding anywhere, including vaginal.   PCP: Dr.  Satira Mccallum in [place]  OB/GYN: [facility] in [place]  Past Medical History:  Diagnosis Date  . Asthma    WELL CONTROLLED  . Chronic kidney disease    H/O STONES  . Endometriosis   . GERD (gastroesophageal reflux disease)   . Headache   . Hypothyroidism   . Thyroid disease    There are no active problems to display for this patient.  Past Surgical History:  Procedure Laterality Date  . APPENDECTOMY    . ENDOMETRIAL BIOPSY    . LAPAROSCOPY N/A 03/13/2016   Procedure: LAPAROSCOPY DIAGNOSTIC;  Surgeon: Honor Loh Ward, MD;  Location: [facility] ORS;  Service: Gynecology;  Laterality: N/A;    OB History    No data available       Home Medications    Prior to Admission medications   Medication Sig Start Date End Date Taking? Authorizing Provider  clonazePAM (KLONOPIN) 1 MG tablet Take 1 mg by mouth 2 (two) times daily as needed.    Historical Provider, MD  cyclobenzaprine (FLEXERIL) 10 MG tablet Take 1 tablet (10 mg total) by mouth 3 (three) times daily as needed for muscle spasms. Patient not taking: Reported on 03/13/2016 02/15/16   Charline Bills Cuthriell, PA-C  etodolac (LODINE) 400 MG tablet Take 1 tablet (400 mg total) by mouth 2 (two) times daily. 05/27/16   Gregor Hams, MD  ipratropium (ATROVENT HFA) 17 MCG/ACT inhaler Inhale 2 puffs into the lungs every 6 (six) hours as needed.     Historical Provider, MD  levothyroxine (SYNTHROID, LEVOTHROID) 25 MCG tablet Take 25 mcg by mouth daily before breakfast.     Historical Provider, MD  ondansetron (ZOFRAN ODT) 4 MG disintegrating tablet Take 1 tablet (4 mg total) by mouth every 8 (eight) hours as needed for nausea or vomiting. Patient not taking: Reported on 03/13/2016 09/25/15   Loney Hering, MD  traMADol (ULTRAM) 50 MG tablet Take 1 tablet (50 mg total) by mouth every 6 (six) hours as needed. 04/24/16   Victorino Dike, FNP    Family History No family history on file.  Social History Social History  Substance Use  Topics  . Smoking status: Never Smoker  . Smokeless tobacco: Not on file  . Alcohol use No  pt is on disability for learning disability   Allergies   Morphine and related; Fentanyl; Betadine [povidone iodine]; and Iodine   Review of Systems Review of Systems  Respiratory: Positive for shortness of breath.   Gastrointestinal: Positive for abdominal pain, diarrhea, nausea and vomiting.  Musculoskeletal: Positive for arthralgias, back pain and neck pain.  Neurological: Positive for dizziness and headaches.  All other systems reviewed and are negative.  Physical Exam Updated Vital Signs Ht 5\' 2"  (1.575 m)   Wt 106 lb (48.1 kg)   LMP 05/26/2016   BMI 19.39 kg/m   ED Triage Vitals  Enc Vitals Group     BP 07/04/16 2305 110/65     Pulse Rate 07/04/16 2305 105     Resp 07/04/16 2305 16     Temp 07/04/16 2305 98.8 F (37.1 C)     Temp Source 07/04/16 2305 Oral     SpO2 07/04/16 2305 97 %     Weight 07/04/16 2256 106 lb (48.1 kg)     Height 07/04/16 2256 5\' 2"  (1.575 m)     Head Circumference --      Peak Flow --      Pain Score 07/04/16 2256 10     Pain Loc --      Pain Edu? --      Excl. in [place]? --    Vital signs normal except for tachycardia   Physical Exam  Constitutional: She is oriented to person, place, and time. She appears well-developed and well-nourished.  Non-toxic appearance. She does not appear ill. No distress.  HENT:  Head: Normocephalic and atraumatic.  Right Ear: External ear normal.  Left Ear: External ear normal.  Nose: Nose normal. No mucosal edema or rhinorrhea.  Mouth/Throat: Oropharynx is clear and moist and mucous membranes are normal. No dental abscesses or uvula swelling.  Eyes: Conjunctivae and EOM are normal. Pupils are equal, round, and reactive to light.  Neck: Normal range of motion and full passive range of motion without pain. Neck supple.  Cardiovascular: Normal rate, regular rhythm and normal heart sounds.  Exam reveals no gallop and  no friction rub.   No murmur heard. Pulmonary/Chest: Effort normal and breath sounds normal. No respiratory distress. She has no wheezes. She has no rhonchi. She has no rales. She exhibits no tenderness and no crepitus.  Abdominal: Soft. Normal appearance and bowel sounds are normal. She exhibits no distension. There is tenderness. There is no rebound and no guarding.  Tender around umbilicus and suprapubic area  Musculoskeletal: Normal range of motion. She exhibits tenderness. She exhibits no edema.  Tender in sacral area midline which she admits is chronic Moves all extremities well.   Neurological: She is alert and oriented to person, place, and time.  She has normal strength. No cranial nerve deficit.  Pt has a speech impediment and is difficult at times to understand what she is saying  Skin: Skin is warm, dry and intact. No rash noted. No erythema. No pallor.  No bruising seen on abdomen, arms or back  Psychiatric: She has a normal mood and affect. Her speech is normal and behavior is normal. Her mood appears not anxious.  Nursing note and vitals reviewed.  ED Treatments / Results   Results for orders placed or performed during the hospital encounter of 07/04/16  Pregnancy, urine  Result Value Ref Range   Preg Test, Ur NEGATIVE NEGATIVE  Urinalysis, Routine w reflex microscopic  Result Value Ref Range   Color, Urine YELLOW YELLOW   APPearance CLEAR CLEAR   Specific Gravity, Urine 1.010 1.005 - 1.030   pH 7.0 5.0 - 8.0   Glucose, UA NEGATIVE NEGATIVE mg/dL   Hgb urine dipstick SMALL (A) NEGATIVE   Bilirubin Urine NEGATIVE NEGATIVE   Ketones, ur NEGATIVE NEGATIVE mg/dL   Protein, ur NEGATIVE NEGATIVE mg/dL   Nitrite NEGATIVE NEGATIVE   Leukocytes, UA SMALL (A) NEGATIVE  Urine microscopic-add on  Result Value Ref Range   Squamous Epithelial / LPF 6-30 (A) NONE SEEN   WBC, UA 6-30 0 - 5 WBC/hpf   RBC / HPF 0-5 0 - 5 RBC/hpf   Bacteria, UA MANY (A) NONE SEEN   Laboratory  interpretation all normal except contaminated UA    Radiology No results found.  Procedures Procedures   Medications Ordered in ED Medications  acetaminophen (TYLENOL) tablet 650 mg (650 mg Oral Given 07/04/16 2338)    Initial Impression / Assessment and Plan / ED Course  I have reviewed the triage vital signs and the nursing notes.  Pertinent labs & imaging results that were available during my care of the patient were reviewed by me and considered in my medical decision making (see chart for details).  Clinical Course   DIAGNOSTIC STUDIES:  Oxygen Saturation is 97% on RA, normal by my interpretation.    11:25 PM Discussed treatment plan with pt at bedside and pt agreed to plan.Patient was given Tylenol for pain until we get the results of her urine pregnancy test. Patient's story is very inconsistent about her mechanism of fall and her injuries. She appears to have chronic abdominal pain and low back pain that she is already taking Percocet for.  12:29 AM Discussed pregnancy test with pt and reported that it was negative. Per nurse, pt is upset because she wants a "stronger pain injection" and is unhappy. She states her mother is coming to pick her up to take her to [facility]. Plan to discharge.  Review of her charts from [facility] show she was last seen in 2016 for chronic pelvic pain.  Review of the [place] shows patient got #60 clonazepam 1 mg tablets on September 6 and #60 oxycodone 7.5/325 on September 6 both from a family practice physician in [place]. She gets these on a monthly basis.   .   Final Clinical Impressions(s) / ED Diagnoses   Final diagnoses:  Fall at home, initial encounter   Plan discharge  Rolland Porter, MD, Barbette Or, MD 07/05/16 0300

## 2016-07-04 NOTE — ED Notes (Signed)
In to give ordered Tylenol patient moaning, asking for stronger pain medication, states "I like shots, can I have a shot of pain medicine". Explained to patient that the physician as only order Tylenol as of right now. Urine collected and sent to lab.

## 2016-07-04 NOTE — ED Notes (Signed)
Pts mother called stated the patient called her crying and saying "shes in severe pain and we werent doing anything for her". I explained to mom that the EDP has seen her, order Tylenol which was already given. Mother stated she was coming to pick patients up "and take her to another hospital".

## 2016-07-05 ENCOUNTER — Emergency Department
Admission: EM | Admit: 2016-07-05 | Discharge: 2016-07-05 | Disposition: A | Discharge status: Home / Self Care | Payer: Medicare Other | Attending: Emergency Medicine | Admitting: Emergency Medicine

## 2016-07-05 ENCOUNTER — Encounter: Payer: Self-pay | Admitting: *Deleted

## 2016-07-05 ENCOUNTER — Emergency Department: Payer: Medicare Other

## 2016-07-05 DIAGNOSIS — N72 Inflammatory disease of cervix uteri: Secondary | ICD-10-CM

## 2016-07-05 DIAGNOSIS — J45909 Unspecified asthma, uncomplicated: Secondary | ICD-10-CM | POA: Diagnosis not present

## 2016-07-05 DIAGNOSIS — N189 Chronic kidney disease, unspecified: Secondary | ICD-10-CM | POA: Diagnosis not present

## 2016-07-05 DIAGNOSIS — E039 Hypothyroidism, unspecified: Secondary | ICD-10-CM | POA: Diagnosis not present

## 2016-07-05 DIAGNOSIS — R102 Pelvic and perineal pain: Secondary | ICD-10-CM | POA: Diagnosis present

## 2016-07-05 DIAGNOSIS — Y939 Activity, unspecified: Secondary | ICD-10-CM | POA: Insufficient documentation

## 2016-07-05 DIAGNOSIS — W19XXXA Unspecified fall, initial encounter: Secondary | ICD-10-CM | POA: Diagnosis not present

## 2016-07-05 DIAGNOSIS — Y999 Unspecified external cause status: Secondary | ICD-10-CM | POA: Diagnosis not present

## 2016-07-05 DIAGNOSIS — Y929 Unspecified place or not applicable: Secondary | ICD-10-CM | POA: Insufficient documentation

## 2016-07-05 LAB — URINALYSIS COMPLETE WITH MICROSCOPIC (ARMC ONLY)
Bilirubin Urine: NEGATIVE
Glucose, UA: NEGATIVE mg/dL
Ketones, ur: NEGATIVE mg/dL
Nitrite: NEGATIVE
Protein, ur: NEGATIVE mg/dL
Specific Gravity, Urine: 1.008 (ref 1.005–1.030)
pH: 6 (ref 5.0–8.0)

## 2016-07-05 LAB — CBC WITH DIFFERENTIAL/PLATELET
Basophils Absolute: 0.1 10*3/uL (ref 0–0.1)
Basophils Relative: 1 %
Eosinophils Absolute: 0.2 10*3/uL (ref 0–0.7)
Eosinophils Relative: 2 %
HCT: 41.9 % (ref 35.0–47.0)
Hemoglobin: 14.8 g/dL (ref 12.0–16.0)
Lymphocytes Relative: 31 %
Lymphs Abs: 3.1 10*3/uL (ref 1.0–3.6)
MCH: 32.1 pg (ref 26.0–34.0)
MCHC: 35.2 g/dL (ref 32.0–36.0)
MCV: 91.1 fL (ref 80.0–100.0)
Monocytes Absolute: 0.7 10*3/uL (ref 0.2–0.9)
Monocytes Relative: 7 %
Neutro Abs: 5.7 10*3/uL (ref 1.4–6.5)
Neutrophils Relative %: 59 %
Platelets: 237 10*3/uL (ref 150–440)
RBC: 4.61 MIL/uL (ref 3.80–5.20)
RDW: 12.3 % (ref 11.5–14.5)
WBC: 9.8 10*3/uL (ref 3.6–11.0)

## 2016-07-05 LAB — URINALYSIS, ROUTINE W REFLEX MICROSCOPIC
Bilirubin Urine: NEGATIVE
Glucose, UA: NEGATIVE mg/dL
Ketones, ur: NEGATIVE mg/dL
Nitrite: NEGATIVE
Protein, ur: NEGATIVE mg/dL
Specific Gravity, Urine: 1.01 (ref 1.005–1.030)
pH: 7 (ref 5.0–8.0)

## 2016-07-05 LAB — COMPREHENSIVE METABOLIC PANEL
ALT: 17 U/L (ref 14–54)
AST: 18 U/L (ref 15–41)
Albumin: 4.7 g/dL (ref 3.5–5.0)
Alkaline Phosphatase: 42 U/L (ref 38–126)
Anion gap: 7 (ref 5–15)
BUN: 7 mg/dL (ref 6–20)
CO2: 23 mmol/L (ref 22–32)
Calcium: 8.9 mg/dL (ref 8.9–10.3)
Chloride: 107 mmol/L (ref 101–111)
Creatinine, Ser: 0.67 mg/dL (ref 0.44–1.00)
GFR calc Af Amer: >60 mL/min (ref >60)
GFR calc non Af Amer: >60 mL/min (ref >60)
Glucose, Bld: 96 mg/dL (ref 65–99)
Potassium: 3.8 mmol/L (ref 3.5–5.1)
Sodium: 137 mmol/L (ref 135–145)
Total Bilirubin: 1.2 mg/dL (ref 0.3–1.2)
Total Protein: 7.7 g/dL (ref 6.5–8.1)

## 2016-07-05 LAB — URINE MICROSCOPIC-ADD ON

## 2016-07-05 LAB — CHLAMYDIA/NGC RT PCR (ARMC ONLY)
Chlamydia Tr: NOT DETECTED
N gonorrhoeae: NOT DETECTED

## 2016-07-05 LAB — WET PREP, GENITAL
Clue Cells Wet Prep HPF POC: NONE SEEN
Sperm: NONE SEEN
Trich, Wet Prep: NONE SEEN
Yeast Wet Prep HPF POC: NONE SEEN

## 2016-07-05 LAB — POCT PREGNANCY, URINE: Preg Test, Ur: NEGATIVE

## 2016-07-05 MED ORDER — ACETAMINOPHEN 325 MG PO TABS
ORAL_TABLET | ORAL | Status: AC
  Filled 2016-07-05: qty 2

## 2016-07-05 MED ORDER — IBUPROFEN 400 MG PO TABS
ORAL_TABLET | ORAL | Status: AC
  Administered 2016-07-05: 400 mg via ORAL
  Filled 2016-07-05: qty 1

## 2016-07-05 MED ORDER — ONDANSETRON 4 MG PO TBDP
ORAL_TABLET | ORAL | Status: AC
  Administered 2016-07-05: 4 mg via ORAL
  Filled 2016-07-05: qty 1

## 2016-07-05 MED ORDER — ACETAMINOPHEN 325 MG PO TABS
650.0000 mg | ORAL_TABLET | Freq: Once | ORAL | Status: AC
  Administered 2016-07-05: 650 mg via ORAL

## 2016-07-05 MED ORDER — IBUPROFEN 400 MG PO TABS
400.0000 mg | ORAL_TABLET | Freq: Once | ORAL | Status: AC
  Administered 2016-07-05: 400 mg via ORAL

## 2016-07-05 MED ORDER — ONDANSETRON 4 MG PO TBDP
4.0000 mg | ORAL_TABLET | Freq: Once | ORAL | Status: AC
  Administered 2016-07-05: 4 mg via ORAL

## 2016-07-05 MED ORDER — TRAMADOL HCL 50 MG PO TABS: 50.0000 mg | ORAL_TABLET | Freq: Four times a day (QID) | ORAL | 0 refills | Status: DC | PRN

## 2016-07-05 NOTE — ED Provider Notes (Signed)
[facility] Emergency Department Provider Note   ____________________________________________    I have reviewed the triage vital signs and the nursing notes.   HISTORY  Chief Complaint Fall Patient is a poor historian  HPI Hannah Shaw is a 20 y.o. female who presents with pelvic pain. She attributes this to a fall which apparently occurred yesterday, afterwards she was seen at [facility] but was displeased with her care there apparently. She also has a history of chronic pelvic pain but reports that she has had worsening pain since yesterday. She denies fevers or chills. No nausea or vomiting. She denies vaginal discharge. She denies dysuria.   Past Medical History:  Diagnosis Date  . Asthma    WELL CONTROLLED  . Chronic kidney disease    H/O STONES  . Endometriosis   . GERD (gastroesophageal reflux disease)   . Headache   . Hypothyroidism   . Thyroid disease     There are no active problems to display for this patient.   Past Surgical History:  Procedure Laterality Date  . APPENDECTOMY    . ENDOMETRIAL BIOPSY    . LAPAROSCOPY N/A 03/13/2016   Procedure: LAPAROSCOPY DIAGNOSTIC;  Surgeon: Honor Loh Ward, MD;  Location: [facility] ORS;  Service: Gynecology;  Laterality: N/A;    Prior to Admission medications   Medication Sig Start Date End Date Taking? Authorizing Provider  clonazePAM (KLONOPIN) 1 MG tablet Take 1 mg by mouth 2 (two) times daily as needed.    Historical Provider, MD  cyclobenzaprine (FLEXERIL) 10 MG tablet Take 1 tablet (10 mg total) by mouth 3 (three) times daily as needed for muscle spasms. Patient not taking: Reported on 03/13/2016 02/15/16   Charline Bills Cuthriell, PA-C  etodolac (LODINE) 400 MG tablet Take 1 tablet (400 mg total) by mouth 2 (two) times daily. 05/27/16   Gregor Hams, MD  ipratropium (ATROVENT HFA) 17 MCG/ACT inhaler Inhale 2 puffs into the lungs every 6 (six) hours as needed.     Historical  Provider, MD  levothyroxine (SYNTHROID, LEVOTHROID) 25 MCG tablet Take 25 mcg by mouth daily before breakfast.     Historical Provider, MD  ondansetron (ZOFRAN ODT) 4 MG disintegrating tablet Take 1 tablet (4 mg total) by mouth every 8 (eight) hours as needed for nausea or vomiting. Patient not taking: Reported on 03/13/2016 09/25/15   Loney Hering, MD  traMADol (ULTRAM) 50 MG tablet Take 1 tablet (50 mg total) by mouth every 6 (six) hours as needed. 04/24/16   Victorino Dike, FNP     Allergies Morphine and related; Fentanyl; Betadine [povidone iodine]; and Iodine  History reviewed. No pertinent family history.  Social History Social History  Substance Use Topics  . Smoking status: Never Smoker  . Smokeless tobacco: Not on file  . Alcohol use No    Review of Systems  Constitutional: No fever/chills Eyes: No visual changes.  ENT: No sore throat. Cardiovascular: Denies chest pain. Respiratory: Denies shortness of breath. Gastrointestinal: As above   Genitourinary: Negative for dysuria. Denies vaginal discharge Musculoskeletal: Negative for back pain. Skin: Negative for rash. Neurological: Negative for headaches   10-point ROS otherwise negative.  ____________________________________________   PHYSICAL EXAM:  VITAL SIGNS: ED Triage Vitals [07/05/16 1549]  Enc Vitals Group     BP 108/66     Pulse Rate 98     Resp 18     Temp 99.6 F (37.6 C)     Temp Source  Oral     SpO2 100 %     Weight 106 lb (48.1 kg)     Height 5\' 2"  (1.575 m)     Head Circumference      Peak Flow      Pain Score 10     Pain Loc      Pain Edu?      Excl. in [place]?     Constitutional: Alert and oriented. No acute distress.Somewhat anxious Eyes: Conjunctivae are normal.  Head: Atraumatic.  Mouth/Throat: Mucous membranes are moist.   Cardiovascular: Normal rate, regular rhythm. Grossly normal heart sounds.  Good peripheral circulation. Respiratory: Normal respiratory effort.  No  retractions. Lungs CTAB. Gastrointestinal: Mild tenderness in the right lower quadrant and left lower quadrant. No distention.  No CVA tenderness. Genitourinary: Severe cervicitis noted, whitish discharge, mild CMT Musculoskeletal: No lower extremity tenderness nor edema.  Warm and well perfused Neurologic:  Normal speech and language. No gross focal neurologic deficits are appreciated.  Skin:  Skin is warm, dry and intact. No rash noted. Psychiatric: Mood and affect are normal. Speech and behavior are normal.  ____________________________________________   LABS (all labs ordered are listed, but only abnormal results are displayed)  Labs Reviewed  WET PREP, GENITAL - Abnormal; Notable for the following:       Result Value   WBC, Wet Prep HPF POC MODERATE (*)    All other components within normal limits  URINALYSIS COMPLETEWITH MICROSCOPIC ([facility] ONLY) - Abnormal; Notable for the following:    Color, Urine YELLOW (*)    APPearance CLEAR (*)    Hgb urine dipstick 1+ (*)    Leukocytes, UA 1+ (*)    Bacteria, UA RARE (*)    Squamous Epithelial / LPF 0-5 (*)    All other components within normal limits  CHLAMYDIA/NGC RT PCR ([facility] ONLY)  CBC WITH DIFFERENTIAL/PLATELET  COMPREHENSIVE METABOLIC PANEL  POCT PREGNANCY, URINE   ____________________________________________  EKG  None ____________________________________________  RADIOLOGY  Ultrasound Demonstrates right-sided ovarian cyst, discussed with patient the need for follow-up in approximately 6-12 weeks. ____________________________________________   PROCEDURES  Procedure(s) performed: No    Critical Care performed: No ____________________________________________   INITIAL IMPRESSION / ASSESSMENT AND PLAN / ED COURSE  Pertinent labs & imaging results that were available during my care of the patient were reviewed by me and considered in my medical decision making (see chart for details).  Patient  presents with pelvic pain. Her behavior is somewhat abnormal and apparently she has a history of chronic pelvic pain for which she is prescribed Percocet monthly. However she seems to feel that her pelvic pain is worsened related to a fall yesterday. Suspiciously her temperature is elevated although she is afebrile, her heart rate is also slightly elevated. On pelvic exam she appears to have cervicitis,  we will proceed ultrasound and blood work  Clinical Course  Ultrasound is reassuring, blood work is normal. For her cervicitis and ovarian cyst I'll have her follow-up with gynecology, I explained this to the patient put this on her discharge instructions. sHe agrees with this plan ____________________________________________   FINAL CLINICAL IMPRESSION(S) / ED DIAGNOSES  Final diagnoses:  Pelvic pain in female      NEW MEDICATIONS STARTED DURING THIS VISIT:  New Prescriptions   No medications on file     Note:  This document was prepared using Dragon voice recognition software and may include unintentional dictation errors.    Lavonia Drafts, MD 07/05/16 2124

## 2016-07-05 NOTE — Discharge Instructions (Signed)
On your ultrasound you have a right sided ovarian cyst which is common. We recommend follow up ultrasound in 3 months to make sure this has gone away. You also have irritation of your cervix which needs to be examined by a gynecologist. I have referred you to one. Please follow up as instructed

## 2016-07-05 NOTE — ED Notes (Signed)
Patient walking circles around ED, patient asked to return to room

## 2016-07-05 NOTE — Discharge Instructions (Signed)
Use ice packs to the injured areas. Your pregnancy test was negative in the ED tonight. Follow up with your GYN this week.

## 2016-07-05 NOTE — ED Triage Notes (Signed)
Pt states she fell last night on her abd and now complains of abd pain and right ovary pain, states she also had a positive pregnancy test at home yesterday, states she was seen at [facility] yesterday and they were mean to her so she came her for evaluation

## 2016-07-05 NOTE — ED Notes (Signed)
Patient verbalizes understanding of discharge instructions, prescriptions, home care and follow up care. Patient out of department at this time. 

## 2016-07-05 NOTE — ED Notes (Signed)
Patient going through drawers and cabinets in room. When asked why she stated "because Im bored", Dr. Tomi Bamberger in to reassess patient

## 2016-07-05 NOTE — ED Notes (Signed)
Patient on her knees in the floor, stating "im sick and hurting, I need better pain medication like a shot, my mom is coming to pick me up and take me to [facility], they will order me better stuff".

## 2016-08-14 ENCOUNTER — Telehealth: Payer: Self-pay | Admitting: Obstetrics and Gynecology

## 2016-08-16 NOTE — Telephone Encounter (Signed)
See phone note

## 2016-10-06 ENCOUNTER — Emergency Department
Admission: EM | Admit: 2016-10-06 | Discharge: 2016-10-07 | Disposition: A | Discharge status: Home / Self Care | Payer: Medicare Other | Attending: Emergency Medicine | Admitting: Emergency Medicine

## 2016-10-06 ENCOUNTER — Encounter: Payer: Self-pay | Admitting: Emergency Medicine

## 2016-10-06 ENCOUNTER — Emergency Department: Payer: Medicare Other

## 2016-10-06 DIAGNOSIS — R102 Pelvic and perineal pain: Secondary | ICD-10-CM | POA: Diagnosis present

## 2016-10-06 DIAGNOSIS — R109 Unspecified abdominal pain: Secondary | ICD-10-CM | POA: Diagnosis not present

## 2016-10-06 DIAGNOSIS — R1031 Right lower quadrant pain: Secondary | ICD-10-CM

## 2016-10-06 DIAGNOSIS — J45909 Unspecified asthma, uncomplicated: Secondary | ICD-10-CM | POA: Insufficient documentation

## 2016-10-06 DIAGNOSIS — N189 Chronic kidney disease, unspecified: Secondary | ICD-10-CM | POA: Insufficient documentation

## 2016-10-06 DIAGNOSIS — G8929 Other chronic pain: Secondary | ICD-10-CM | POA: Insufficient documentation

## 2016-10-06 DIAGNOSIS — Z79899 Other long term (current) drug therapy: Secondary | ICD-10-CM | POA: Insufficient documentation

## 2016-10-06 DIAGNOSIS — R112 Nausea with vomiting, unspecified: Secondary | ICD-10-CM | POA: Insufficient documentation

## 2016-10-06 LAB — COMPREHENSIVE METABOLIC PANEL
ALT: 34 U/L (ref 14–54)
AST: 32 U/L (ref 15–41)
Albumin: 4.6 g/dL (ref 3.5–5.0)
Alkaline Phosphatase: 39 U/L (ref 38–126)
Anion gap: 7 (ref 5–15)
BUN: 7 mg/dL (ref 6–20)
CO2: 23 mmol/L (ref 22–32)
Calcium: 9.2 mg/dL (ref 8.9–10.3)
Chloride: 105 mmol/L (ref 101–111)
Creatinine, Ser: 0.5 mg/dL (ref 0.44–1.00)
GFR calc Af Amer: >60 mL/min (ref >60)
GFR calc non Af Amer: >60 mL/min (ref >60)
Glucose, Bld: 86 mg/dL (ref 65–99)
Potassium: 3.9 mmol/L (ref 3.5–5.1)
Sodium: 135 mmol/L (ref 135–145)
Total Bilirubin: 0.9 mg/dL (ref 0.3–1.2)
Total Protein: 8 g/dL (ref 6.5–8.1)

## 2016-10-06 LAB — URINALYSIS, COMPLETE (UACMP) WITH MICROSCOPIC
Bilirubin Urine: NEGATIVE
Glucose, UA: NEGATIVE mg/dL
Ketones, ur: NEGATIVE mg/dL
Nitrite: NEGATIVE
Protein, ur: NEGATIVE mg/dL
Specific Gravity, Urine: 1.01 (ref 1.005–1.030)
pH: 5 (ref 5.0–8.0)

## 2016-10-06 LAB — CBC
HCT: 41.7 % (ref 35.0–47.0)
Hemoglobin: 14.6 g/dL (ref 12.0–16.0)
MCH: 32.8 pg (ref 26.0–34.0)
MCHC: 34.9 g/dL (ref 32.0–36.0)
MCV: 94 fL (ref 80.0–100.0)
Platelets: 297 10*3/uL (ref 150–440)
RBC: 4.44 MIL/uL (ref 3.80–5.20)
RDW: 11.6 % (ref 11.5–14.5)
WBC: 8.3 10*3/uL (ref 3.6–11.0)

## 2016-10-06 LAB — LIPASE, BLOOD: Lipase: 23 U/L (ref 11–51)

## 2016-10-06 LAB — POCT PREGNANCY, URINE: Preg Test, Ur: NEGATIVE

## 2016-10-06 MED ORDER — ONDANSETRON 4 MG PO TBDP
4.0000 mg | ORAL_TABLET | Freq: Once | ORAL | Status: AC
  Administered 2016-10-06: 4 mg via ORAL
  Filled 2016-10-06: qty 1

## 2016-10-06 MED ORDER — KETOROLAC TROMETHAMINE 60 MG/2ML IM SOLN
60.0000 mg | Freq: Once | INTRAMUSCULAR | Status: AC
  Administered 2016-10-06: 60 mg via INTRAMUSCULAR
  Filled 2016-10-06: qty 2

## 2016-10-06 NOTE — ED Notes (Signed)
Pt reports she is having lower abd pain since she was 16 - the pain started again yesterday - pt reports she has endometriosis and that is the same pain she is having now - reports nausea and vomiting (refuses to give a count) - denies diarrhea

## 2016-10-06 NOTE — ED Notes (Addendum)
Patient transported to [facility] 

## 2016-10-06 NOTE — ED Triage Notes (Signed)
Pt to ed with c/o abd pain x several months,  Pt states started vomiting yesterday.  Also fever today.  Pt denies diarrhea.  Pt alert and oriented, skin warm and dry.

## 2016-10-06 NOTE — ED Provider Notes (Addendum)
[facility] Emergency Department Provider Note    First MD Initiated Contact with Patient 10/06/16 2312     (approximate)  I have reviewed the triage vital signs and the nursing notes.   HISTORY  Chief Complaint Fever; Abdominal Pain; and Emesis   HPI Hannah Shaw is a 20 y.o. female of chronic abdominal pain presents to the emergency department with pelvic pain that is currently 10 out of 10 associated with vomiting and fever patient states at home with a temperature 102. Patient states that her current pain is "my usual belly pain". Patient states that she's been seen by GYN and gastroenterology with no diagnosis given at this time. Patient denies any diarrhea. Patient denies any urinary symptoms of increased frequency or urgency or dysuria.   Past Medical History:  Diagnosis Date  . Asthma    WELL CONTROLLED  . Chronic kidney disease    H/O STONES  . Endometriosis   . GERD (gastroesophageal reflux disease)   . Headache   . Hypothyroidism   . Thyroid disease     There are no active problems to display for this patient.   Past Surgical History:  Procedure Laterality Date  . APPENDECTOMY    . ENDOMETRIAL BIOPSY    . LAPAROSCOPY N/A 03/13/2016   Procedure: LAPAROSCOPY DIAGNOSTIC;  Surgeon: Honor Loh Ward, MD;  Location: [facility] ORS;  Service: Gynecology;  Laterality: N/A;    Prior to Admission medications   Medication Sig Start Date End Date Taking? Authorizing Provider  clonazePAM (KLONOPIN) 1 MG tablet Take 1 mg by mouth 2 (two) times daily as needed.    Historical Provider, MD  cyclobenzaprine (FLEXERIL) 10 MG tablet Take 1 tablet (10 mg total) by mouth 3 (three) times daily as needed for muscle spasms. Patient not taking: Reported on 03/13/2016 02/15/16   Charline Bills Cuthriell, PA-C  etodolac (LODINE) 400 MG tablet Take 1 tablet (400 mg total) by mouth 2 (two) times daily. 05/27/16   Gregor Hams, MD  ipratropium (ATROVENT HFA) 17 MCG/ACT  inhaler Inhale 2 puffs into the lungs every 6 (six) hours as needed.     Historical Provider, MD  levothyroxine (SYNTHROID, LEVOTHROID) 25 MCG tablet Take 25 mcg by mouth daily before breakfast.     Historical Provider, MD  ondansetron (ZOFRAN ODT) 4 MG disintegrating tablet Take 1 tablet (4 mg total) by mouth every 8 (eight) hours as needed for nausea or vomiting. Patient not taking: Reported on 03/13/2016 09/25/15   Loney Hering, MD  traMADol (ULTRAM) 50 MG tablet Take 1 tablet (50 mg total) by mouth every 6 (six) hours as needed. 07/05/16 07/05/17  Lavonia Drafts, MD    Allergies Morphine and related; Fentanyl; Betadine [povidone iodine]; and Iodine  History reviewed. No pertinent family history.  Social History Social History  Substance Use Topics  . Smoking status: Never Smoker  . Smokeless tobacco: Never Used  . Alcohol use No    Review of Systems Constitutional: No fever/chills Eyes: No visual changes. ENT: No sore throat. Cardiovascular: Denies chest pain. Respiratory: Denies shortness of breath. Gastrointestinal:Positive for abdominal pain and vomiting. Genitourinary: Negative for dysuria. Musculoskeletal: Negative for back pain. Skin: Negative for rash. Neurological: Negative for headaches, focal weakness or numbness.  10-point ROS otherwise negative.  ____________________________________________   PHYSICAL EXAM:  VITAL SIGNS: ED Triage Vitals  Enc Vitals Group     BP 10/06/16 1829 127/74     Pulse Rate 10/06/16 1829 (!) 104  Resp 10/06/16 1829 20     Temp 10/06/16 1829 100.2 F (37.9 C)     Temp Source 10/06/16 1829 Oral     SpO2 10/06/16 1829 100 %     Weight 10/06/16 1829 106 lb (48.1 kg)     Height --      Head Circumference --      Peak Flow --      Pain Score 10/06/16 1830 10     Pain Loc --      Pain Edu? --      Excl. in [place]? --     Constitutional: Alert and oriented. Well appearing and in no acute distress.Patient using her cell phone  during evaluation Eyes: Conjunctivae are normal. PERRL. EOMI. Head: Atraumatic. Mouth/Throat: Mucous membranes are moist.  Oropharynx non-erythematous. Neck: No stridor.  No meningeal signs.  Cardiovascular: Normal rate, regular rhythm. Good peripheral circulation. Grossly normal heart sounds. Respiratory: Normal respiratory effort.  No retractions. Lungs CTAB. Gastrointestinal: Diffuse tenderness to palpation NonE with distraction. No guarding or rebound. No distention.  Musculoskeletal: No lower extremity tenderness nor edema. No gross deformities of extremities. Neurologic:  Normal speech and language. No gross focal neurologic deficits are appreciated.  Skin:  Skin is warm, dry and intact. No rash noted. Psychiatric: Mood and affect are normal. Speech and behavior are normal.  ____________________________________________   LABS (all labs ordered are listed, but only abnormal results are displayed)  Labs Reviewed  URINALYSIS, COMPLETE (UACMP) WITH MICROSCOPIC - Abnormal; Notable for the following:       Result Value   Color, Urine YELLOW (*)    APPearance CLEAR (*)    Hgb urine dipstick MODERATE (*)    Leukocytes, UA MODERATE (*)    Bacteria, UA RARE (*)    Squamous Epithelial / LPF 0-5 (*)    All other components within normal limits  LIPASE, BLOOD  COMPREHENSIVE METABOLIC PANEL  CBC  POC URINE PREG, ED  POCT PREGNANCY, URINE     RADIOLOGY I, Renwick N Kristilyn Coltrane, personally viewed and evaluated these images (plain radiographs) as part of my medical decision making, as well as reviewing the written report by the radiologist.  No results found.   Procedures    INITIAL IMPRESSION / ASSESSMENT AND PLAN / ED COURSE  Pertinent labs & imaging results that were available during my care of the patient were reviewed by me and considered in my medical decision making (see chart for details).   No clear etiology for patient's chronic abdominal pain identified. Patient be  referred to gastroenterology Dr. Vicente Males for further outpatient evaluation  Clinical Course     ____________________________________________  FINAL CLINICAL IMPRESSION(S) / ED DIAGNOSES  Final diagnoses:  Chronic abdominal pain     MEDICATIONS GIVEN DURING THIS VISIT:  Medications  ketorolac (TORADOL) injection 60 mg (not administered)  ondansetron (ZOFRAN-ODT) disintegrating tablet 4 mg (not administered)     NEW OUTPATIENT MEDICATIONS STARTED DURING THIS VISIT:  New Prescriptions   No medications on file    Modified Medications   No medications on file    Discontinued Medications   No medications on file     Note:  This document was prepared using Dragon voice recognition software and may include unintentional dictation errors.    Gregor Hams, MD 10/07/16 [id number]    Gregor Hams, MD 10/07/16 [phone]

## 2016-10-06 NOTE — ED Notes (Signed)
Patient ambulatory to triage with steady gait, without difficulty or distress noted; pt reports mid lower abd pain with fever since yesterday with no accomp symptoms; pt updated on wait time

## 2016-10-07 MED ORDER — ONDANSETRON 4 MG PO TBDP: 4.0000 mg | ORAL_TABLET | Freq: Three times a day (TID) | ORAL | 0 refills | Status: DC | PRN

## 2016-10-22 ENCOUNTER — Emergency Department
Admission: EM | Admit: 2016-10-22 | Discharge: 2016-10-22 | Disposition: A | Discharge status: Home / Self Care | Payer: Medicare Other | Attending: Emergency Medicine | Admitting: Emergency Medicine

## 2016-10-22 ENCOUNTER — Encounter: Payer: Self-pay | Admitting: Emergency Medicine

## 2016-10-22 ENCOUNTER — Emergency Department: Payer: Medicare Other

## 2016-10-22 DIAGNOSIS — J069 Acute upper respiratory infection, unspecified: Secondary | ICD-10-CM | POA: Diagnosis not present

## 2016-10-22 DIAGNOSIS — G8929 Other chronic pain: Secondary | ICD-10-CM

## 2016-10-22 DIAGNOSIS — E039 Hypothyroidism, unspecified: Secondary | ICD-10-CM | POA: Diagnosis not present

## 2016-10-22 DIAGNOSIS — R109 Unspecified abdominal pain: Secondary | ICD-10-CM | POA: Insufficient documentation

## 2016-10-22 DIAGNOSIS — J45909 Unspecified asthma, uncomplicated: Secondary | ICD-10-CM | POA: Diagnosis not present

## 2016-10-22 DIAGNOSIS — B9789 Other viral agents as the cause of diseases classified elsewhere: Secondary | ICD-10-CM

## 2016-10-22 DIAGNOSIS — N189 Chronic kidney disease, unspecified: Secondary | ICD-10-CM | POA: Insufficient documentation

## 2016-10-22 DIAGNOSIS — R112 Nausea with vomiting, unspecified: Secondary | ICD-10-CM | POA: Insufficient documentation

## 2016-10-22 DIAGNOSIS — R05 Cough: Secondary | ICD-10-CM | POA: Diagnosis present

## 2016-10-22 LAB — CBC
HCT: 40.6 % (ref 35.0–47.0)
Hemoglobin: 14.3 g/dL (ref 12.0–16.0)
MCH: 32.7 pg (ref 26.0–34.0)
MCHC: 35.1 g/dL (ref 32.0–36.0)
MCV: 93.1 fL (ref 80.0–100.0)
Platelets: 284 10*3/uL (ref 150–440)
RBC: 4.37 MIL/uL (ref 3.80–5.20)
RDW: 11.8 % (ref 11.5–14.5)
WBC: 10.6 10*3/uL (ref 3.6–11.0)

## 2016-10-22 LAB — COMPREHENSIVE METABOLIC PANEL
ALT: 18 U/L (ref 14–54)
AST: 18 U/L (ref 15–41)
Albumin: 4.6 g/dL (ref 3.5–5.0)
Alkaline Phosphatase: 42 U/L (ref 38–126)
Anion gap: 9 (ref 5–15)
BUN: 8 mg/dL (ref 6–20)
CO2: 26 mmol/L (ref 22–32)
Calcium: 9.4 mg/dL (ref 8.9–10.3)
Chloride: 104 mmol/L (ref 101–111)
Creatinine, Ser: 0.7 mg/dL (ref 0.44–1.00)
GFR calc Af Amer: >60 mL/min (ref >60)
GFR calc non Af Amer: >60 mL/min (ref >60)
Glucose, Bld: 98 mg/dL (ref 65–99)
Potassium: 3.5 mmol/L (ref 3.5–5.1)
Sodium: 139 mmol/L (ref 135–145)
Total Bilirubin: 0.9 mg/dL (ref 0.3–1.2)
Total Protein: 8 g/dL (ref 6.5–8.1)

## 2016-10-22 LAB — URINALYSIS, COMPLETE (UACMP) WITH MICROSCOPIC
Bacteria, UA: NONE SEEN
Bilirubin Urine: NEGATIVE
Glucose, UA: NEGATIVE mg/dL
Ketones, ur: NEGATIVE mg/dL
Nitrite: NEGATIVE
Protein, ur: NEGATIVE mg/dL
Specific Gravity, Urine: 1.016 (ref 1.005–1.030)
pH: 5 (ref 5.0–8.0)

## 2016-10-22 LAB — LIPASE, BLOOD: Lipase: 24 U/L (ref 11–51)

## 2016-10-22 LAB — POCT PREGNANCY, URINE: Preg Test, Ur: NEGATIVE

## 2016-10-22 MED ORDER — ONDANSETRON 4 MG PO TBDP
4.0000 mg | ORAL_TABLET | Freq: Once | ORAL | Status: AC
  Administered 2016-10-22: 4 mg via ORAL

## 2016-10-22 MED ORDER — ONDANSETRON 4 MG PO TBDP: ORAL_TABLET | ORAL | 0 refills | Status: DC

## 2016-10-22 NOTE — Discharge Instructions (Signed)
You have been seen in the Emergency Department (ED) today for a likely viral illness.  Please drink plenty of clear fluids (water, Gatorade, chicken broth, etc).  You may use Tylenol and/or Motrin according to label instructions.  You can alternate between the two without any side effects.   Use your regular narcotic pain medication according to the label instructions.  Please follow up with your doctor as listed above.  Call your doctor or return to the Emergency Department (ED) if you are unable to tolerate fluids due to vomiting, have worsening trouble breathing, become extremely tired or difficult to awaken, or if you develop any other symptoms that concern you.

## 2016-10-22 NOTE — ED Provider Notes (Signed)
[facility] Emergency Department Provider Note  ____________________________________________   First MD Initiated Contact with Patient 10/22/16 2130     (approximate)  I have reviewed the triage vital signs and the nursing notes.   HISTORY  Chief Complaint Emesis    HPI Hannah Shaw is a 20 y.o. female with a history that includes chronic abdominal painwho presents for 2 days of gradual onset viral symptoms including fever/chills, myalgias, nausea/vomiting, abdominal pain, nasal congestion/runny nose, and mild cough.  Nothing makes symptoms better, and they are getting worse on their own.  No recent sick contacts.  Abd pain feels similar to prior abdominal pain, but the body aches and other symptoms are new.   Past Medical History:  Diagnosis Date  . Asthma    WELL CONTROLLED  . Chronic kidney disease    H/O STONES  . Endometriosis   . GERD (gastroesophageal reflux disease)   . Headache   . Hypothyroidism   . Thyroid disease     There are no active problems to display for this patient.   Past Surgical History:  Procedure Laterality Date  . APPENDECTOMY    . ENDOMETRIAL BIOPSY    . LAPAROSCOPY N/A 03/13/2016   Procedure: LAPAROSCOPY DIAGNOSTIC;  Surgeon: Honor Loh Ward, MD;  Location: [facility] ORS;  Service: Gynecology;  Laterality: N/A;    Prior to Admission medications   Medication Sig Start Date End Date Taking? Authorizing Provider  clonazePAM (KLONOPIN) 1 MG tablet Take 1 mg by mouth 2 (two) times daily as needed.    Historical Provider, MD  FLUoxetine (PROZAC) 20 MG capsule Take 1 capsule by mouth daily. 08/27/16   Historical Provider, MD  ipratropium (ATROVENT HFA) 17 MCG/ACT inhaler Inhale 2 puffs into the lungs every 6 (six) hours as needed.     Historical Provider, MD  levothyroxine (SYNTHROID, LEVOTHROID) 25 MCG tablet Take 25 mcg by mouth daily before breakfast.     Historical Provider, MD  ondansetron (ZOFRAN ODT) 4 MG  disintegrating tablet Allow 1-2 tablets to dissolve in your mouth every 8 hours as needed for nausea/vomiting 10/22/16   Hinda Kehr, MD  oxyCODONE-acetaminophen (PERCOCET) 7.5-325 MG tablet Take 1 tablet by mouth 2 (two) times daily. 09/24/16   Historical Provider, MD  PROAIR HFA 108 (90 Base) MCG/ACT inhaler Inhale 2 puffs into the lungs every 4 (four) hours as needed. 08/28/16   Historical Provider, MD    Allergies Morphine and related; Fentanyl; Betadine [povidone iodine]; and Iodine  No family history on file.  Social History Social History  Substance Use Topics  . Smoking status: Never Smoker  . Smokeless tobacco: Never Used  . Alcohol use No    Review of Systems Constitutional: +fever/chills with myalgias.   Eyes: No visual changes. ENT: No sore throat. Cardiovascular: Denies chest pain. Respiratory: Denies shortness of breath.  +Cough Gastrointestinal: Chronic abdominal pain.  +N/V.  No diarrhea.  No constipation. Genitourinary: Negative for dysuria. Musculoskeletal: Negative for back pain. Skin: Negative for rash. Neurological: Negative for headaches, focal weakness or numbness.  10-point ROS otherwise negative.  ____________________________________________   PHYSICAL EXAM:  VITAL SIGNS: ED Triage Vitals  Enc Vitals Group     BP 10/22/16 1928 120/74     Pulse Rate 10/22/16 1928 (!) 108     Resp 10/22/16 1928 20     Temp 10/22/16 1928 (!) 100.4 F (38 C)     Temp Source 10/22/16 1928 Oral     SpO2 10/22/16 1928 100 %  Weight 10/22/16 1929 107 lb (48.5 kg)     Height 10/22/16 1929 5\' 2"  (1.575 m)     Head Circumference --      Peak Flow --      Pain Score 10/22/16 1929 10     Pain Loc --      Pain Edu? --      Excl. in [place]? --     Constitutional: Alert and oriented. Has the appearance of chronic illness with acute viral URI symptoms Eyes: Conjunctivae are watery. PERRL. EOMI. Head: Atraumatic. Nose: +congestion Mouth/Throat: Mucous membranes are  moist.  Oropharynx non-erythematous. Neck: No stridor.  No meningeal signs.   Cardiovascular: Normal rate, regular rhythm. Good peripheral circulation. Grossly normal heart sounds. Respiratory: Normal respiratory effort.  No retractions. Lungs CTAB. Gastrointestinal: Soft and nontender. No distention.  Musculoskeletal: No lower extremity tenderness nor edema. No gross deformities of extremities. Neurologic:  Normal speech and language. No gross focal neurologic deficits are appreciated.  Skin:  Skin is warm, dry and intact. No rash noted. Psychiatric: Mood and affect are depressed .  ____________________________________________   LABS (all labs ordered are listed, but only abnormal results are displayed)  Labs Reviewed  URINALYSIS, COMPLETE (UACMP) WITH MICROSCOPIC - Abnormal; Notable for the following:       Result Value   Color, Urine YELLOW (*)    APPearance CLEAR (*)    Hgb urine dipstick MODERATE (*)    Leukocytes, UA SMALL (*)    Squamous Epithelial / LPF 0-5 (*)    All other components within normal limits  CBC  COMPREHENSIVE METABOLIC PANEL  LIPASE, BLOOD  POC URINE PREG, ED  POCT PREGNANCY, URINE   ____________________________________________  EKG  None - EKG not ordered by ED physician ____________________________________________  RADIOLOGY   Dg Chest 2 View  Result Date: 10/22/2016 CLINICAL DATA:  Cough, myalgias, subjective fever. History of asthma. EXAM: CHEST  2 VIEW COMPARISON:  09/25/2015 FINDINGS: The cardiomediastinal contours are normal. The lungs are clear. Pulmonary vasculature is normal. No consolidation, pleural effusion, or pneumothorax. No acute osseous abnormalities are seen. IMPRESSION: Clear lungs.  No acute abnormality. Electronically Signed   By: Jeb Levering M.D.   On: 10/22/2016 22:26    ____________________________________________   PROCEDURES  Procedure(s) performed:   Procedures   Critical Care performed:  No ____________________________________________   INITIAL IMPRESSION / ASSESSMENT AND PLAN / ED COURSE  Pertinent labs & imaging results that were available during my care of the patient were reviewed by me and considered in my medical decision making (see chart for details).  Acute viral URI on top of chronic abdominal pain.  Low grade fever consistent with URI diagnosis.  No evidence of PNA on CXR.  I reviewed the patient's prescription history over the last 12 months in the [place] Controlled Substances Database, and she just filled a prescription for Percocet 7.5 mg tabs (x60) within the last few days.  I will encourage her to use those pills, and I will provide a prescription for Zofran.  Encouraged good PO intake.    I gave my usual and customary return precautions.     ____________________________________________  FINAL CLINICAL IMPRESSION(S) / ED DIAGNOSES  Final diagnoses:  Viral URI with cough  Chronic abdominal pain  Non-intractable vomiting with nausea, unspecified vomiting type     MEDICATIONS GIVEN DURING THIS VISIT:  Medications  ondansetron (ZOFRAN-ODT) disintegrating tablet 4 mg (4 mg Oral Given 10/22/16 2212)     NEW OUTPATIENT MEDICATIONS STARTED DURING  THIS VISIT:  New Prescriptions   ONDANSETRON (ZOFRAN ODT) 4 MG DISINTEGRATING TABLET    Allow 1-2 tablets to dissolve in your mouth every 8 hours as needed for nausea/vomiting    Modified Medications   No medications on file    Discontinued Medications   ONDANSETRON (ZOFRAN ODT) 4 MG DISINTEGRATING TABLET    Take 1 tablet (4 mg total) by mouth every 8 (eight) hours as needed for nausea or vomiting.     Note:  This document was prepared using Dragon voice recognition software and may include unintentional dictation errors.    Hinda Kehr, MD 10/22/16 2240

## 2016-10-22 NOTE — ED Triage Notes (Signed)
Pt in with co n.v.d and abd pain since yest along with body aches.

## 2016-11-20 ENCOUNTER — Emergency Department
Admission: EM | Admit: 2016-11-20 | Discharge: 2016-11-20 | Disposition: A | Discharge status: Home / Self Care | Payer: Medicare Other | Attending: Emergency Medicine | Admitting: Emergency Medicine

## 2016-11-20 ENCOUNTER — Encounter: Payer: Self-pay | Admitting: Emergency Medicine

## 2016-11-20 DIAGNOSIS — R112 Nausea with vomiting, unspecified: Secondary | ICD-10-CM | POA: Diagnosis present

## 2016-11-20 DIAGNOSIS — B349 Viral infection, unspecified: Secondary | ICD-10-CM

## 2016-11-20 DIAGNOSIS — J45909 Unspecified asthma, uncomplicated: Secondary | ICD-10-CM | POA: Diagnosis not present

## 2016-11-20 DIAGNOSIS — N189 Chronic kidney disease, unspecified: Secondary | ICD-10-CM | POA: Diagnosis not present

## 2016-11-20 LAB — INFLUENZA PANEL BY PCR (TYPE A & B)
Influenza A By PCR: NEGATIVE
Influenza B By PCR: NEGATIVE

## 2016-11-20 LAB — POCT PREGNANCY, URINE: Preg Test, Ur: NEGATIVE

## 2016-11-20 MED ORDER — ONDANSETRON 4 MG PO TBDP: 4.0000 mg | ORAL_TABLET | Freq: Three times a day (TID) | ORAL | 0 refills | Status: DC | PRN

## 2016-11-20 MED ORDER — ACETAMINOPHEN 500 MG PO TABS
1000.0000 mg | ORAL_TABLET | Freq: Once | ORAL | Status: AC
  Administered 2016-11-20: 1000 mg via ORAL
  Filled 2016-11-20: qty 2

## 2016-11-20 MED ORDER — SODIUM CHLORIDE 0.9 % IV BOLUS (SEPSIS)
1000.0000 mL | Freq: Once | INTRAVENOUS | Status: AC
  Administered 2016-11-20: 1000 mL via INTRAVENOUS

## 2016-11-20 MED ORDER — ONDANSETRON HCL 4 MG/2ML IJ SOLN
4.0000 mg | Freq: Once | INTRAMUSCULAR | Status: AC
  Administered 2016-11-20: 4 mg via INTRAVENOUS
  Filled 2016-11-20: qty 2

## 2016-11-20 NOTE — ED Notes (Signed)
Pt reports 3 days of body aches, nausea and vomiting as well as diarrhea. Pt states she has been running fevers at home, taking ibuprofen and tylenol every 4 hours. Last took both about 4 hours ago. Decreased appetite, unable to keep liquids down.  C/o vomiting and diarrhea several times today, loose stools and dark brown vomit. Pt reports cough and congestion.  Did not receive a flu shot.

## 2016-11-20 NOTE — ED Triage Notes (Signed)
Pt in via POV with complaints of body aches, N/V, sore throat x 3 days.  Pt febrile upon arrival.  No immediate distress noted at this time.

## 2016-11-20 NOTE — Discharge Instructions (Signed)
Clear fluids for the next 24 hours. Advance diet slowly beginning with bananas, rice, applesauce and toast. Tylenol if needed for fever. Zofran as needed for nausea. Follow-up with [facility] clinic if any continued problems while you're living in [place]. Return to the emergency room if any severe worsening of your symptoms.

## 2016-11-20 NOTE — ED Provider Notes (Signed)
[facility] Emergency Department Provider Note   ____________________________________________   First MD Initiated Contact with Patient 11/20/16 [phone]     (approximate)  I have reviewed the triage vital signs and the nursing notes.   HISTORY  Chief Complaint Influenza    HPI Hannah Shaw is a 21 y.o. female is here complaining of body aches, nausea, vomiting and sore throat for 3 days. Patient states she has been running fever and other members of her family has same symptoms. Patient has been taking over-the-counter Tylenol and ibuprofen for her fever. Patient states that she also began vomiting and just today has vomited approximately 20 times. Patient states she feels weak. She denies any diarrhea. Currently she rates her pain as a 10 over 10. Patient in triage and temperature 101.   Past Medical History:  Diagnosis Date  . Asthma    WELL CONTROLLED  . Chronic kidney disease    H/O STONES  . Endometriosis   . GERD (gastroesophageal reflux disease)   . Headache   . Hypothyroidism   . Thyroid disease     There are no active problems to display for this patient.   Past Surgical History:  Procedure Laterality Date  . APPENDECTOMY    . ENDOMETRIAL BIOPSY    . LAPAROSCOPY N/A 03/13/2016   Procedure: LAPAROSCOPY DIAGNOSTIC;  Surgeon: Honor Loh Ward, MD;  Location: [facility] ORS;  Service: Gynecology;  Laterality: N/A;    Prior to Admission medications   Medication Sig Start Date End Date Taking? Authorizing Provider  clonazePAM (KLONOPIN) 1 MG tablet Take 1 mg by mouth 2 (two) times daily as needed.    Historical Provider, MD  FLUoxetine (PROZAC) 20 MG capsule Take 1 capsule by mouth daily. 08/27/16   Historical Provider, MD  ipratropium (ATROVENT HFA) 17 MCG/ACT inhaler Inhale 2 puffs into the lungs every 6 (six) hours as needed.     Historical Provider, MD  levothyroxine (SYNTHROID, LEVOTHROID) 25 MCG tablet Take 25 mcg by mouth daily before  breakfast.     Historical Provider, MD  ondansetron (ZOFRAN ODT) 4 MG disintegrating tablet Take 1 tablet (4 mg total) by mouth every 8 (eight) hours as needed for nausea or vomiting. 11/20/16   Johnn Hai, PA-C  oxyCODONE-acetaminophen (PERCOCET) 7.5-325 MG tablet Take 1 tablet by mouth 2 (two) times daily. 09/24/16   Historical Provider, MD  PROAIR HFA 108 (90 Base) MCG/ACT inhaler Inhale 2 puffs into the lungs every 4 (four) hours as needed. 08/28/16   Historical Provider, MD    Allergies Morphine and related; Fentanyl; Betadine [povidone iodine]; and Iodine  No family history on file.  Social History Social History  Substance Use Topics  . Smoking status: Never Smoker  . Smokeless tobacco: Never Used  . Alcohol use No    Review of Systems Constitutional: Positive fever/chills Eyes: No visual changes. ENT: Positive sore throat. Cardiovascular: Denies chest pain. Respiratory: Denies shortness of breath. Gastrointestinal: No abdominal pain.  Positive nausea, positive vomiting.  No diarrhea.   Genitourinary: Negative for dysuria. Musculoskeletal: Generalized body aches positive. Skin: Negative for rash. Neurological: Negative for headaches, focal weakness or numbness.  10-point ROS otherwise negative.  ____________________________________________   PHYSICAL EXAM:  VITAL SIGNS: ED Triage Vitals  Enc Vitals Group     BP 11/20/16 0856 120/80     Pulse Rate 11/20/16 0856 (!) 107     Resp 11/20/16 0856 (!) 24     Temp 11/20/16 0856 (!) 101 F (  38.3 C)     Temp Source 11/20/16 0856 Oral     SpO2 11/20/16 0856 100 %     Weight 11/20/16 0857 103 lb (46.7 kg)     Height 11/20/16 0857 5\' 2"  (1.575 m)     Head Circumference --      Peak Flow --      Pain Score 11/20/16 0857 10     Pain Loc --      Pain Edu? --      Excl. in [place]? --     Constitutional: Alert and oriented. Sick appearing and in no acute distress. Eyes: Conjunctivae are normal. PERRL. EOMI. Head:  Atraumatic. Nose: No congestion/rhinnorhea. Neck: No stridor.   Mouth: Moist mucosa. No erythema or exudate noted. Hematological/Lymphatic/Immunilogical: No cervical lymphadenopathy. Cardiovascular: Normal rate, regular rhythm. Grossly normal heart sounds.  Good peripheral circulation. Respiratory: Normal respiratory effort.  No retractions. Lungs CTAB. Gastrointestinal: Soft and nontender. No distention. Bowel sounds hyperactive at present. No CVA tenderness. Musculoskeletal: Moves upper and lower extremities without any difficulty. Normal gait was noted.  Neurologic:  Normal speech and language. No gross focal neurologic deficits are appreciated. No gait instability. Skin:  Skin is warm, dry and intact. No rash noted. Psychiatric: Mood and affect are normal. Speech and behavior are normal.  ____________________________________________   LABS (all labs ordered are listed, but only abnormal results are displayed)  Labs Reviewed  INFLUENZA PANEL BY PCR (TYPE A & B)  POC URINE PREG, ED  POCT PREGNANCY, URINE     PROCEDURES  Procedure(s) performed: None  Procedures  Critical Care performed: No  ____________________________________________   INITIAL IMPRESSION / ASSESSMENT AND PLAN / ED COURSE  Pertinent labs & imaging results that were available during my care of the patient were reviewed by me and considered in my medical decision making (see chart for details).  ----------------------------------------- 12:51 PM on 11/20/2016 ----------------------------------------- Patient was given a bolus of normal saline along with Zofran IV. There was no continued vomiting and patient began by mouth challenge without any nausea. Patient will be discharged home with Zofran prescription. She is to remain on clear liquids for the next 24 hours and advance diet accordingly. Tylenol as needed for fever. Follow-up with [facility] if any continued problems.        ____________________________________________   FINAL CLINICAL IMPRESSION(S) / ED DIAGNOSES  Final diagnoses:  Viral illness  Non-intractable vomiting with nausea, unspecified vomiting type      NEW MEDICATIONS STARTED DURING THIS VISIT:  Discharge Medication List as of 11/20/2016  1:56 PM       Note:  This document was prepared using Dragon voice recognition software and may include unintentional dictation errors.    Johnn Hai, PA-C 11/20/16 1502    Harvest Dark, MD 11/20/16 [phone]

## 2016-11-20 NOTE — ED Notes (Signed)
Pt given several packs of crackers and some sprite to drink.

## 2016-12-29 ENCOUNTER — Emergency Department: Payer: Medicare Other

## 2016-12-29 ENCOUNTER — Emergency Department
Admission: EM | Admit: 2016-12-29 | Discharge: 2016-12-29 | Disposition: A | Discharge status: Home / Self Care | Payer: Medicare Other | Attending: Emergency Medicine | Admitting: Emergency Medicine

## 2016-12-29 DIAGNOSIS — Z79899 Other long term (current) drug therapy: Secondary | ICD-10-CM | POA: Diagnosis not present

## 2016-12-29 DIAGNOSIS — R1031 Right lower quadrant pain: Secondary | ICD-10-CM

## 2016-12-29 DIAGNOSIS — E039 Hypothyroidism, unspecified: Secondary | ICD-10-CM | POA: Insufficient documentation

## 2016-12-29 DIAGNOSIS — J45909 Unspecified asthma, uncomplicated: Secondary | ICD-10-CM | POA: Insufficient documentation

## 2016-12-29 DIAGNOSIS — N760 Acute vaginitis: Secondary | ICD-10-CM | POA: Diagnosis not present

## 2016-12-29 DIAGNOSIS — N189 Chronic kidney disease, unspecified: Secondary | ICD-10-CM | POA: Insufficient documentation

## 2016-12-29 DIAGNOSIS — B9689 Other specified bacterial agents as the cause of diseases classified elsewhere: Secondary | ICD-10-CM

## 2016-12-29 LAB — URINALYSIS, COMPLETE (UACMP) WITH MICROSCOPIC
Bacteria, UA: NONE SEEN
Bilirubin Urine: NEGATIVE
Glucose, UA: NEGATIVE mg/dL
Ketones, ur: NEGATIVE mg/dL
Leukocytes, UA: NEGATIVE
Nitrite: NEGATIVE
Protein, ur: NEGATIVE mg/dL
Specific Gravity, Urine: 1.02 (ref 1.005–1.030)
pH: 8 (ref 5.0–8.0)

## 2016-12-29 LAB — COMPREHENSIVE METABOLIC PANEL
ALT: 16 U/L (ref 14–54)
AST: 20 U/L (ref 15–41)
Albumin: 4.8 g/dL (ref 3.5–5.0)
Alkaline Phosphatase: 36 U/L — ABNORMAL LOW (ref 38–126)
Anion gap: 7 (ref 5–15)
BUN: 10 mg/dL (ref 6–20)
CO2: 25 mmol/L (ref 22–32)
Calcium: 9.3 mg/dL (ref 8.9–10.3)
Chloride: 107 mmol/L (ref 101–111)
Creatinine, Ser: 0.55 mg/dL (ref 0.44–1.00)
GFR calc Af Amer: >60 mL/min (ref >60)
GFR calc non Af Amer: >60 mL/min (ref >60)
Glucose, Bld: 100 mg/dL — ABNORMAL HIGH (ref 65–99)
Potassium: 4.4 mmol/L (ref 3.5–5.1)
Sodium: 139 mmol/L (ref 135–145)
Total Bilirubin: 0.9 mg/dL (ref 0.3–1.2)
Total Protein: 8 g/dL (ref 6.5–8.1)

## 2016-12-29 LAB — CHLAMYDIA/NGC RT PCR (ARMC ONLY)
Chlamydia Tr: NOT DETECTED
N gonorrhoeae: NOT DETECTED

## 2016-12-29 LAB — POCT PREGNANCY, URINE: Preg Test, Ur: NEGATIVE

## 2016-12-29 LAB — WET PREP, GENITAL
Sperm: NONE SEEN
Trich, Wet Prep: NONE SEEN
Yeast Wet Prep HPF POC: NONE SEEN

## 2016-12-29 LAB — CBC
HCT: 40.9 % (ref 35.0–47.0)
Hemoglobin: 14.2 g/dL (ref 12.0–16.0)
MCH: 32.4 pg (ref 26.0–34.0)
MCHC: 34.7 g/dL (ref 32.0–36.0)
MCV: 93.2 fL (ref 80.0–100.0)
Platelets: 262 10*3/uL (ref 150–440)
RBC: 4.38 MIL/uL (ref 3.80–5.20)
RDW: 12.4 % (ref 11.5–14.5)
WBC: 6.8 10*3/uL (ref 3.6–11.0)

## 2016-12-29 LAB — LIPASE, BLOOD: Lipase: 25 U/L (ref 11–51)

## 2016-12-29 MED ORDER — DIPHENHYDRAMINE HCL 50 MG/ML IJ SOLN
INTRAMUSCULAR | Status: AC
  Administered 2016-12-29: 25 mg via INTRAMUSCULAR
  Filled 2016-12-29: qty 1

## 2016-12-29 MED ORDER — IBUPROFEN 600 MG PO TABS: 600.0000 mg | ORAL_TABLET | Freq: Four times a day (QID) | ORAL | 0 refills | Status: DC | PRN

## 2016-12-29 MED ORDER — METRONIDAZOLE 500 MG PO TABS: 500.0000 mg | ORAL_TABLET | Freq: Two times a day (BID) | ORAL | 0 refills | Status: AC

## 2016-12-29 MED ORDER — ONDANSETRON 4 MG PO TBDP
4.0000 mg | ORAL_TABLET | Freq: Once | ORAL | Status: AC
  Administered 2016-12-29: 4 mg via ORAL
  Filled 2016-12-29: qty 1

## 2016-12-29 MED ORDER — HYDROMORPHONE HCL 1 MG/ML IJ SOLN
1.0000 mg | Freq: Once | INTRAMUSCULAR | Status: AC
  Administered 2016-12-29: 1 mg via INTRAMUSCULAR
  Filled 2016-12-29: qty 1

## 2016-12-29 MED ORDER — KETOROLAC TROMETHAMINE 30 MG/ML IJ SOLN: 30.0000 mg | Freq: Once | INTRAMUSCULAR | Status: DC

## 2016-12-29 MED ORDER — DIPHENHYDRAMINE HCL 50 MG/ML IJ SOLN
25.0000 mg | Freq: Once | INTRAMUSCULAR | Status: AC
  Administered 2016-12-29: 25 mg via INTRAMUSCULAR

## 2016-12-29 NOTE — ED Notes (Signed)
Patient transported to Ultrasound 

## 2016-12-29 NOTE — ED Triage Notes (Signed)
Patient ambulatory to triage with steady gait, without difficulty or distress noted; pt reports right lower abd pain since Friday, nonradiating with no accomp symptoms; st hx ovarian cysts

## 2016-12-29 NOTE — ED Provider Notes (Addendum)
[facility] Emergency Department Provider Note  Time seen: 8:13 PM  I have reviewed the triage vital signs and the nursing notes.   HISTORY  Chief Complaint Abdominal Pain    HPI Hannah Shaw is a 21 y.o. female with a past medical history of endometriosis, presents to the emergency department for right lower quadrant abdominal pain. According to the patient she began tensing right lower quadrant abdominal pain 4 days ago, states it is gotten somewhat worse so she came to the emergency department for evaluation. Denies any known fever, states some nausea but denies vomiting, diarrhea, dysuria, hematuria, vaginal discharge. Patient states a long history of ovarian cysts as well as endometriosis to which this feels similar. Currently describes her pain as moderate. Patient is status post appendectomy.  Past Medical History:  Diagnosis Date  . Asthma    WELL CONTROLLED  . Chronic kidney disease    H/O STONES  . Endometriosis   . GERD (gastroesophageal reflux disease)   . Headache   . Hypothyroidism   . Thyroid disease     There are no active problems to display for this patient.   Past Surgical History:  Procedure Laterality Date  . APPENDECTOMY    . ENDOMETRIAL BIOPSY    . LAPAROSCOPY N/A 03/13/2016   Procedure: LAPAROSCOPY DIAGNOSTIC;  Surgeon: Honor Loh Ward, MD;  Location: [facility] ORS;  Service: Gynecology;  Laterality: N/A;    Prior to Admission medications   Medication Sig Start Date End Date Taking? Authorizing Provider  clonazePAM (KLONOPIN) 1 MG tablet Take 1 mg by mouth 2 (two) times daily as needed.    Historical Provider, MD  FLUoxetine (PROZAC) 20 MG capsule Take 1 capsule by mouth daily. 08/27/16   Historical Provider, MD  ipratropium (ATROVENT HFA) 17 MCG/ACT inhaler Inhale 2 puffs into the lungs every 6 (six) hours as needed.     Historical Provider, MD  levothyroxine (SYNTHROID, LEVOTHROID) 25 MCG tablet Take 25 mcg by mouth daily before  breakfast.     Historical Provider, MD  ondansetron (ZOFRAN ODT) 4 MG disintegrating tablet Take 1 tablet (4 mg total) by mouth every 8 (eight) hours as needed for nausea or vomiting. 11/20/16   Johnn Hai, PA-C  oxyCODONE-acetaminophen (PERCOCET) 7.5-325 MG tablet Take 1 tablet by mouth 2 (two) times daily. 09/24/16   Historical Provider, MD  PROAIR HFA 108 (90 Base) MCG/ACT inhaler Inhale 2 puffs into the lungs every 4 (four) hours as needed. 08/28/16   Historical Provider, MD    Allergies  Allergen Reactions  . Morphine And Related Hives and Shortness Of Breath  . Fentanyl     PT DENIES THIS ALLERGY DURING PHONE INTERVIEW ON 03-05-16  . Betadine [Povidone Iodine] Rash  . Iodine Rash    Family History  Problem Relation Age of Onset  . Cervical cancer Mother   . Lung cancer Paternal Uncle     Social History Social History  Substance Use Topics  . Smoking status: Never Smoker  . Smokeless tobacco: Never Used  . Alcohol use No    Review of Systems Constitutional: Negative for fever. Cardiovascular: Negative for chest pain. Respiratory: Negative for shortness of breath. Gastrointestinal: Right lower quadrant abdominal pain. Positive for nausea. Negative for vomiting or diarrhea Genitourinary: Negative for dysuria. Neurological: Negative for headache 10-point ROS otherwise negative.  ____________________________________________   PHYSICAL EXAM:  VITAL SIGNS: ED Triage Vitals  Enc Vitals Group     BP 12/29/16 1912 126/82  Pulse Rate 12/29/16 1912 (!) 110     Resp 12/29/16 1912 18     Temp 12/29/16 1912 99.5 F (37.5 C)     Temp Source 12/29/16 1912 Oral     SpO2 12/29/16 1912 99 %     Weight 12/29/16 1913 104 lb (47.2 kg)     Height 12/29/16 1913 5\' 2"  (1.575 m)     Head Circumference --      Peak Flow --      Pain Score 12/29/16 1913 10     Pain Loc --      Pain Edu? --      Excl. in [place]? --     Constitutional: Alert and oriented. Well appearing and in  no distress. Eyes: Normal exam ENT   Head: Normocephalic and atraumatic.   Mouth/Throat: Mucous membranes are moist. Cardiovascular: Normal rate, regular rhythm. No murmur Respiratory: Normal respiratory effort without tachypnea nor retractions. Breath sounds are clear  Gastrointestinal: Soft, mild very low right lower quadrant abdominal tenderness. No tenderness over McBurney's point. No rebound or guarding. No distention. Musculoskeletal: Nontender with normal range of motion in all extremities.  Neurologic:  Normal speech and language. No gross focal neurologic deficits Skin:  Skin is warm, dry and intact.  Psychiatric: Mood and affect are normal.   ____________________________________________     RADIOLOGY  IMPRESSION: 1. Nonvisualization of the right ovary. No adnexal mass or abnormal fluid evident. 2. Normal uterus and left ovary. Intact left ovarian perfusion on Doppler.  ____________________________________________   INITIAL IMPRESSION / ASSESSMENT AND PLAN / ED COURSE  Pertinent labs & imaging results that were available during my care of the patient were reviewed by me and considered in my medical decision making (see chart for details).  The patient presents the emergency department with low right lower quadrant abdominal pain for the past 3 or 4 days. States it history of ovarian cysts as well as endometriosis. States this feels like prior ovarian cyst. States some nausea at times but denies any vomiting, diarrhea, dysuria, hematuria, fever. Patient's labs are largely within normal limits. On pelvic examination patient has a normal amount of discharge, mild right adnexal tenderness, no cervical motion tenderness, no left adnexal tenderness.  Ultrasound cannot definitively visualize the right ovary however no masses or fluid collections evident. Patient states her pain is much improved at this time. Patient's wet prep is positive for bacterial vaginitis we will  treat with Flagyl. We'll discharge a short course of pain medication. I discussed with the patient and need to call her OB/GYN this week to arrange a follow-up appointment this week or early next week. I also discussed normal abdominal pain return precautions for worsening pain or fever.  Patient did believe she is having a slight reaction to the pain medication which she describes as some shortness of breath. No shortness of breath evident on exam, patient appears normal in no distress. Received Benadryl. We will discharge home.  ____________________________________________   FINAL CLINICAL IMPRESSION(S) / ED DIAGNOSES  Right lower quadrant abdominal pain Bacterial vaginitis   Harvest Dark, MD 12/29/16 2255    Harvest Dark, MD 12/29/16 2257

## 2016-12-29 NOTE — ED Notes (Signed)
MD at bedside to update pt. And pts boyfriend.

## 2016-12-30 ENCOUNTER — Ambulatory Visit: Payer: Self-pay | Admitting: Obstetrics and Gynecology

## 2017-01-08 ENCOUNTER — Ambulatory Visit: Payer: Self-pay

## 2017-01-11 ENCOUNTER — Ambulatory Visit: Payer: Self-pay

## 2017-01-12 ENCOUNTER — Ambulatory Visit: Payer: Self-pay

## 2017-01-15 ENCOUNTER — Ambulatory Visit: Payer: Self-pay

## 2017-01-19 ENCOUNTER — Encounter: Payer: Self-pay | Admitting: Obstetrics and Gynecology

## 2017-01-19 ENCOUNTER — Ambulatory Visit (INDEPENDENT_AMBULATORY_CARE_PROVIDER_SITE_OTHER): Payer: Medicare Other | Admitting: Obstetrics and Gynecology

## 2017-01-19 VITALS — BP 110/72 | Ht 61.0 in | Wt 106.0 lb

## 2017-01-19 DIAGNOSIS — R1031 Right lower quadrant pain: Secondary | ICD-10-CM | POA: Diagnosis not present

## 2017-01-19 NOTE — Progress Notes (Signed)
Obstetrics & Gynecology Office Visit   Chief Complaint  Patient presents with  . Pelvic Pain    History of Present Illness: The patient is a 21 y.o. female with a long history of chronic pelvic pain.  She has had an exhaustive workup for this pain. She states that she underwent laparoscopy at age 33 where endometriosis was found and removed. She has been on Depo Provera for suppression. She had a diagnostic laparoscopy in May 2017, where there was no evidence of endometriosis seen.  She has a provider in [place], [place], who prescribes for her pain medication on a pain contract. She is currently taking percocet 7.5/325mg  twice daily.  She states her current pain is on her right side. The pain is 10/10, but is getting somewhat better. She has been to the ER at the beginning of this month with no etiology found.  Review of Systems: Review of Systems  Constitutional: Negative.   HENT: Negative.   Eyes: Negative.   Respiratory: Negative.   Cardiovascular: Negative.   Gastrointestinal: Positive for abdominal pain. Negative for constipation, diarrhea, melena, nausea and vomiting.  Genitourinary: Negative.   Musculoskeletal: Negative.   Skin: Negative.   Neurological: Negative.   Psychiatric/Behavioral: Negative.     Past Medical History:  Diagnosis Date  . Asthma    WELL CONTROLLED  . Chronic kidney disease    H/O STONES  . Endometriosis   . GERD (gastroesophageal reflux disease)   . Headache   . Hypothyroidism   . Thyroid disease     Past Surgical History:  Procedure Laterality Date  . APPENDECTOMY    . ENDOMETRIAL BIOPSY    . LAPAROSCOPY N/A 03/13/2016   Procedure: LAPAROSCOPY DIAGNOSTIC;  Surgeon: Honor Loh Ward, MD;  Location: [facility] ORS;  Service: Gynecology;  Laterality: N/A;    Gynecologic History: Patient's last menstrual period was 01/15/2017.  Family History  Problem Relation Age of Onset  . Cervical cancer Mother   . Lung cancer Paternal Uncle      Social History   Social History  . Marital status: Single    Spouse name: N/A  . Number of children: N/A  . Years of education: N/A   Occupational History  . Not on file.   Social History Main Topics  . Smoking status: Never Smoker  . Smokeless tobacco: Never Used  . Alcohol use No  . Drug use: No  . Sexual activity: Not on file   Other Topics Concern  . Not on file   Social History Narrative  . No narrative on file   Allergies  Allergen Reactions  . Morphine And Related Hives and Shortness Of Breath  . Fentanyl     PT DENIES THIS ALLERGY DURING PHONE INTERVIEW ON 03-05-16  . Betadine [Povidone Iodine] Rash  . Iodine Rash    Medications:   Medication Sig Start Date End Date Taking? Authorizing Provider  clonazePAM (KLONOPIN) 1 MG tablet Take 1 mg by mouth 2 (two) times daily as needed.   Yes Historical Provider, MD  FLUoxetine (PROZAC) 20 MG capsule Take 1 capsule by mouth daily. 08/27/16  Yes Historical Provider, MD  ibuprofen (ADVIL,MOTRIN) 600 MG tablet Take 1 tablet (600 mg total) by mouth every 6 (six) hours as needed. 12/29/16  Yes Harvest Dark, MD  ipratropium (ATROVENT HFA) 17 MCG/ACT inhaler Inhale 2 puffs into the lungs every 6 (six) hours as needed.    Yes Historical Provider, MD  levothyroxine (SYNTHROID, LEVOTHROID) 25 MCG tablet  Take 25 mcg by mouth daily before breakfast.    Yes Historical Provider, MD  medroxyPROGESTERone (DEPO-PROVERA) 150 MG/ML injection Inject 150 mg into the muscle every 3 (three) months.   Yes Historical Provider, MD  oxyCODONE-acetaminophen (PERCOCET) 7.5-325 MG tablet Take 1 tablet by mouth 2 (two) times daily. 09/24/16  Yes Historical Provider, MD  PROAIR HFA 108 (90 Base) MCG/ACT inhaler Inhale 2 puffs into the lungs every 4 (four) hours as needed. 08/28/16  Yes Historical Provider, MD  ondansetron (ZOFRAN ODT) 4 MG disintegrating tablet Take 1 tablet (4 mg total) by mouth every 8 (eight) hours as needed for nausea or  vomiting. Patient not taking: Reported on 01/19/2017 11/20/16   Johnn Hai, PA-C   Physical Exam BP 110/72   Ht 5\' 1"  (1.549 m)   Wt 106 lb (48.1 kg)   LMP 01/15/2017   BMI 20.03 kg/m   Patient's last menstrual period was 01/15/2017.  General: NAD HEENT: normocephalic, anicteric Thyroid: no enlargement, no palpable nodules Pulmonary: No increased work of breathing Cardiovascular: RRR, distal pulses 2+ Abdomen: NABS, soft, mildly tender to palpation in right flank and right lower quadrant, non-distended.  No hepatomegaly, splenomegaly or masses palpable. No evidence of hernia  Extremities: no edema, erythema, or tenderness Neurologic: Grossly intact Psychiatric: mood appropriate, affect full  Assessment: 21 y.o. with chronic right lower quadrant abdominal pain.   Plan: The pain seems to be worse. I discussed with her that I could not offer her many treatment options. She is taking a progesterone for her endometriosis. She has had a laparoscopy in the last year. She has been worked up in the emergency room on several occasions recently. No particular etiology is otherwise found. She is receiving high-dose pain medication under a pain contract from a doctor in [facility]. I cannot therefore offer her pain medication. I discussed that I have very limited options in offering her help. I recommended that she continue to have this pain managed through her primary care provider in [place].  15 minutes spent in face to face discussion with > 50% spent in counseling and management of her chronic abdominal pain.   Prentice Docker, MD 01/19/2017 5:49 PM

## 2017-01-29 ENCOUNTER — Telehealth: Payer: Self-pay

## 2017-01-29 NOTE — Telephone Encounter (Signed)
Pt calling was in on the 27th of last month for depo shot.  She's been really sick on it. States she may need to quit taking it.  pls call [phone]

## 2017-01-29 NOTE — Telephone Encounter (Signed)
Pt calling again.

## 2017-02-01 ENCOUNTER — Telehealth: Payer: Self-pay

## 2017-02-01 NOTE — Telephone Encounter (Signed)
Pt calling again.  Needs to speak to someone about the depo shot.  [phone]

## 2017-02-01 NOTE — Telephone Encounter (Addendum)
Pt calling again.  States she can't eat, can't think straight, depo making her sick.  Usually depo does not make her sick.  Please call.  # given [phone], other # on file.   Pt calling re depo. Please call.

## 2017-02-01 NOTE — Telephone Encounter (Signed)
Pt aware that I would not think this would be from Depo and to give it a little more time. She is also aware depo stays in her system for about 3 months or until next injection.

## 2017-03-03 ENCOUNTER — Telehealth: Payer: Self-pay

## 2017-03-03 NOTE — Telephone Encounter (Signed)
Pt states she had a +UPT but is on depo. Advised pt that last depo was given on 10/13/17 per [facility] and that it was no longer effective after 01/12/17. Pt's LMP was 3/23 making her and estimated 6 w 5 d today. Pt is on several medications and advised per [facility]MS to limit narcotic usage and medications would be reviewed at [facility] appt on 03/26/17 with [facility]. Pt aware.

## 2017-03-26 ENCOUNTER — Encounter: Payer: Medicare Other | Admitting: Obstetrics & Gynecology

## 2017-03-30 ENCOUNTER — Telehealth: Payer: Self-pay

## 2017-03-30 NOTE — Telephone Encounter (Signed)
Please call pt and schedule a conference

## 2017-03-30 NOTE — Telephone Encounter (Signed)
Pt calling.  She is ready to have a kid. "Can [place] place an egg inside of her to get her preg?"  [phone]

## 2017-03-30 NOTE — Telephone Encounter (Signed)
Pt calling again, kind of yelling, same msg.

## 2017-03-31 NOTE — Telephone Encounter (Signed)
Pt is schedule 05/03/17 with [facility]

## 2017-04-03 ENCOUNTER — Emergency Department: Payer: Medicare Other

## 2017-04-03 ENCOUNTER — Encounter: Payer: Self-pay | Admitting: Emergency Medicine

## 2017-04-03 ENCOUNTER — Emergency Department
Admission: EM | Admit: 2017-04-03 | Discharge: 2017-04-03 | Disposition: A | Discharge status: Home / Self Care | Payer: Medicare Other | Attending: Emergency Medicine | Admitting: Emergency Medicine

## 2017-04-03 DIAGNOSIS — J45909 Unspecified asthma, uncomplicated: Secondary | ICD-10-CM | POA: Insufficient documentation

## 2017-04-03 DIAGNOSIS — N390 Urinary tract infection, site not specified: Secondary | ICD-10-CM | POA: Diagnosis not present

## 2017-04-03 DIAGNOSIS — R197 Diarrhea, unspecified: Secondary | ICD-10-CM | POA: Insufficient documentation

## 2017-04-03 DIAGNOSIS — E876 Hypokalemia: Secondary | ICD-10-CM | POA: Diagnosis not present

## 2017-04-03 DIAGNOSIS — N189 Chronic kidney disease, unspecified: Secondary | ICD-10-CM | POA: Diagnosis not present

## 2017-04-03 DIAGNOSIS — R112 Nausea with vomiting, unspecified: Secondary | ICD-10-CM | POA: Insufficient documentation

## 2017-04-03 DIAGNOSIS — E039 Hypothyroidism, unspecified: Secondary | ICD-10-CM | POA: Diagnosis not present

## 2017-04-03 DIAGNOSIS — Z79899 Other long term (current) drug therapy: Secondary | ICD-10-CM | POA: Diagnosis not present

## 2017-04-03 DIAGNOSIS — R1030 Lower abdominal pain, unspecified: Secondary | ICD-10-CM | POA: Diagnosis present

## 2017-04-03 LAB — URINALYSIS, COMPLETE (UACMP) WITH MICROSCOPIC
Bilirubin Urine: NEGATIVE
Glucose, UA: NEGATIVE mg/dL
Ketones, ur: NEGATIVE mg/dL
Nitrite: POSITIVE — AB
Protein, ur: NEGATIVE mg/dL
Specific Gravity, Urine: 1.019 (ref 1.005–1.030)
pH: 5 (ref 5.0–8.0)

## 2017-04-03 LAB — COMPREHENSIVE METABOLIC PANEL
ALT: 16 U/L (ref 14–54)
AST: 20 U/L (ref 15–41)
Albumin: 4.8 g/dL (ref 3.5–5.0)
Alkaline Phosphatase: 41 U/L (ref 38–126)
Anion gap: 9 (ref 5–15)
BUN: 6 mg/dL (ref 6–20)
CO2: 25 mmol/L (ref 22–32)
Calcium: 9.4 mg/dL (ref 8.9–10.3)
Chloride: 104 mmol/L (ref 101–111)
Creatinine, Ser: 0.41 mg/dL — ABNORMAL LOW (ref 0.44–1.00)
GFR calc Af Amer: >60 mL/min (ref >60)
GFR calc non Af Amer: >60 mL/min (ref >60)
Glucose, Bld: 93 mg/dL (ref 65–99)
Potassium: 2.9 mmol/L — ABNORMAL LOW (ref 3.5–5.1)
Sodium: 138 mmol/L (ref 135–145)
Total Bilirubin: 1 mg/dL (ref 0.3–1.2)
Total Protein: 8.6 g/dL — ABNORMAL HIGH (ref 6.5–8.1)

## 2017-04-03 LAB — WET PREP, GENITAL
Clue Cells Wet Prep HPF POC: NONE SEEN
Sperm: NONE SEEN
Trich, Wet Prep: NONE SEEN
Yeast Wet Prep HPF POC: NONE SEEN

## 2017-04-03 LAB — CBC
HCT: 39.8 % (ref 35.0–47.0)
Hemoglobin: 13.9 g/dL (ref 12.0–16.0)
MCH: 32 pg (ref 26.0–34.0)
MCHC: 34.9 g/dL (ref 32.0–36.0)
MCV: 91.6 fL (ref 80.0–100.0)
Platelets: 288 10*3/uL (ref 150–440)
RBC: 4.34 MIL/uL (ref 3.80–5.20)
RDW: 12 % (ref 11.5–14.5)
WBC: 5.7 10*3/uL (ref 3.6–11.0)

## 2017-04-03 LAB — CHLAMYDIA/NGC RT PCR (ARMC ONLY)
Chlamydia Tr: NOT DETECTED
N gonorrhoeae: NOT DETECTED

## 2017-04-03 LAB — LIPASE, BLOOD: Lipase: 32 U/L (ref 11–51)

## 2017-04-03 LAB — POCT PREGNANCY, URINE: Preg Test, Ur: NEGATIVE

## 2017-04-03 MED ORDER — POTASSIUM CHLORIDE 20 MEQ PO PACK
40.0000 meq | PACK | Freq: Once | ORAL | Status: AC
  Administered 2017-04-03: 40 meq via ORAL
  Filled 2017-04-03: qty 2

## 2017-04-03 MED ORDER — KETOROLAC TROMETHAMINE 30 MG/ML IJ SOLN
INTRAMUSCULAR | Status: AC
  Administered 2017-04-03: 15 mg via INTRAVENOUS
  Filled 2017-04-03: qty 1

## 2017-04-03 MED ORDER — ONDANSETRON HCL 4 MG/2ML IJ SOLN
4.0000 mg | Freq: Once | INTRAMUSCULAR | Status: AC
  Administered 2017-04-03: 4 mg via INTRAVENOUS
  Filled 2017-04-03: qty 2

## 2017-04-03 MED ORDER — KETOROLAC TROMETHAMINE 30 MG/ML IJ SOLN
15.0000 mg | Freq: Once | INTRAMUSCULAR | Status: AC
  Administered 2017-04-03: 15 mg via INTRAVENOUS

## 2017-04-03 MED ORDER — CEPHALEXIN 500 MG PO CAPS
500.0000 mg | ORAL_CAPSULE | Freq: Two times a day (BID) | ORAL | 0 refills | Status: DC
Start: 2017-04-03 — End: 2018-02-14

## 2017-04-03 MED ORDER — ONDANSETRON HCL 4 MG PO TABS: 4.0000 mg | ORAL_TABLET | Freq: Three times a day (TID) | ORAL | 0 refills | Status: DC | PRN

## 2017-04-03 MED ORDER — POTASSIUM CHLORIDE CRYS ER 20 MEQ PO TBCR
40.0000 meq | EXTENDED_RELEASE_TABLET | Freq: Once | ORAL | Status: DC
  Filled 2017-04-03: qty 2

## 2017-04-03 MED ORDER — DIPHENHYDRAMINE HCL 50 MG/ML IJ SOLN
25.0000 mg | Freq: Once | INTRAMUSCULAR | Status: AC
  Administered 2017-04-03: 25 mg via INTRAVENOUS

## 2017-04-03 MED ORDER — HYDROMORPHONE HCL 1 MG/ML IJ SOLN
0.5000 mg | Freq: Once | INTRAMUSCULAR | Status: AC
  Administered 2017-04-03: 0.5 mg via INTRAVENOUS
  Filled 2017-04-03: qty 1

## 2017-04-03 MED ORDER — CEFTRIAXONE SODIUM 1 G IJ SOLR
1.0000 g | Freq: Once | INTRAMUSCULAR | Status: AC
  Administered 2017-04-03: 1 g via INTRAVENOUS
  Filled 2017-04-03: qty 10

## 2017-04-03 MED ORDER — SODIUM CHLORIDE 0.9 % IV BOLUS (SEPSIS)
1000.0000 mL | Freq: Once | INTRAVENOUS | Status: AC
  Administered 2017-04-03: 1000 mL via INTRAVENOUS

## 2017-04-03 MED ORDER — DIPHENHYDRAMINE HCL 50 MG/ML IJ SOLN
INTRAMUSCULAR | Status: AC
  Administered 2017-04-03: 25 mg via INTRAVENOUS
  Filled 2017-04-03: qty 1

## 2017-04-03 NOTE — ED Provider Notes (Signed)
[facility] Emergency Department Provider Note ____________________________________________   I have reviewed the triage vital signs and the triage nursing note.  HISTORY  Chief Complaint Abdominal Pain   Historian Patient  HPI Hannah Shaw is a 21 y.o. female reporting a history of endometriosis, presents with 1-2 days of lower abdominal cramping which feels similar to prior endometriosis, but different in the fact that she's had 10+ episodes of non-bloody nonbilious emesis as well as numerous episodes of watery diarrhea. Denies vaginal discharge or vaginal bleeding. Denies fever. Denies sick contacts significant travel history.  Pain is moderate to severe and located in the lower abdomen suprapubic and over towards the right side. Reports appendectomy. Reports allergy to morphine but states that she can take Dilaudid. Nothing seems to endorse her better.    Past Medical History:  Diagnosis Date  . Asthma    WELL CONTROLLED  . Chronic kidney disease    H/O STONES  . Endometriosis   . GERD (gastroesophageal reflux disease)   . Headache   . Hypothyroidism   . Thyroid disease     Patient Active Problem List   Diagnosis Date Noted  . Right lower quadrant abdominal pain 01/19/2017    Past Surgical History:  Procedure Laterality Date  . APPENDECTOMY    . ENDOMETRIAL BIOPSY    . LAPAROSCOPY N/A 03/13/2016   Procedure: LAPAROSCOPY DIAGNOSTIC;  Surgeon: Honor Loh Ward, MD;  Location: [facility] ORS;  Service: Gynecology;  Laterality: N/A;    Prior to Admission medications   Medication Sig Start Date End Date Taking? Authorizing Provider  cephALEXin (KEFLEX) 500 MG capsule Take 1 capsule (500 mg total) by mouth 2 (two) times daily. 04/03/17   Lisa Roca, MD  clonazePAM (KLONOPIN) 1 MG tablet Take 1 mg by mouth 2 (two) times daily as needed.    [provider]  FLUoxetine (PROZAC) 20 MG capsule Take 1 capsule by mouth daily. 08/27/16   [provider]  ibuprofen (ADVIL,MOTRIN) 600 MG tablet Take 1 tablet (600 mg total) by mouth every 6 (six) hours as needed. 12/29/16   Harvest Dark, MD  ipratropium (ATROVENT HFA) 17 MCG/ACT inhaler Inhale 2 puffs into the lungs every 6 (six) hours as needed.     [provider]  levothyroxine (SYNTHROID, LEVOTHROID) 25 MCG tablet Take 25 mcg by mouth daily before breakfast.     [provider]  medroxyPROGESTERone (DEPO-PROVERA) 150 MG/ML injection Inject 150 mg into the muscle every 3 (three) months.    [provider]  ondansetron (ZOFRAN ODT) 4 MG disintegrating tablet Take 1 tablet (4 mg total) by mouth every 8 (eight) hours as needed for nausea or vomiting. Patient not taking: Reported on 01/19/2017 11/20/16   Letitia Neri L, PA-C  ondansetron (ZOFRAN) 4 MG tablet Take 1 tablet (4 mg total) by mouth every 8 (eight) hours as needed for nausea or vomiting. 04/03/17   Lisa Roca, MD  oxyCODONE-acetaminophen (PERCOCET) 7.5-325 MG tablet Take 1 tablet by mouth 2 (two) times daily. 09/24/16   [provider]  PROAIR HFA 108 (90 Base) MCG/ACT inhaler Inhale 2 puffs into the lungs every 4 (four) hours as needed. 08/28/16   [provider]    Allergies  Allergen Reactions  . Morphine And Related Hives and Shortness Of Breath  . Fentanyl     PT DENIES THIS ALLERGY DURING PHONE INTERVIEW ON 03-05-16  . Betadine [Povidone Iodine] Rash  . Iodine Rash    Family History  Problem Relation Age of Onset  . Cervical cancer Mother   . Lung cancer Paternal Uncle     Social History Social History  Substance Use Topics  . Smoking status: Never Smoker  . Smokeless tobacco: Never Used  . Alcohol use No    Review of Systems  Constitutional: Negative for fever. Eyes: Negative for visual changes. ENT: Negative for sore throat. Cardiovascular: Negative for chest pain. Respiratory: Negative for shortness of breath. Gastrointestinal:as per history of  present illness. Genitourinary: Negative for dysuria. Musculoskeletal: Negative for back pain. Skin: Negative for rash. Neurological: Negative for headache.  ____________________________________________   PHYSICAL EXAM:  VITAL SIGNS: ED Triage Vitals [04/03/17 1009]  Enc Vitals Group     BP 112/75     Pulse Rate 99     Resp 18     Temp 99.5 F (37.5 C)     Temp Source Oral     SpO2 100 %     Weight 104 lb (47.2 kg)     Height 5\' 2"  (1.575 m)     Head Circumference      Peak Flow      Pain Score 10     Pain Loc      Pain Edu?      Excl. in [place]?      Constitutional: Alert and oriented. Well appearing and in no distress. HEENT   Head: Normocephalic and atraumatic.      Eyes: Conjunctivae are normal. Pupils equal and round.       Ears:         Nose: No congestion/rhinnorhea.   Mouth/Throat: Mucous membranes are moist.  Some mild speech impediment.   Neck: No stridor. Cardiovascular/Chest: Normal rate, regular rhythm.  No murmurs, rubs, or gallops. Respiratory: Normal respiratory effort without tachypnea nor retractions. Breath sounds are clear and equal bilaterally. No wheezes/rales/rhonchi. Gastrointestinal: Soft. No distention, no guarding, no rebound.  Moderate tenderness suprapubic and low pelvis bilaterally.  Genitourinary/rectal: Scant vaginal discharge. No cervical motion tenderness or adnexal mass. Musculoskeletal: Nontender with normal range of motion in all extremities. No joint effusions.  No lower extremity tenderness.  No edema. Neurologic:  Normal speech and language. No gross or focal neurologic deficits are appreciated. Skin:  Skin is warm, dry and intact. No rash noted. Psychiatric: Mood and affect are normal. Speech and behavior are normal. Patient exhibits appropriate insight and judgment.   ____________________________________________  LABS (pertinent positives/negatives)  Labs Reviewed  WET PREP, GENITAL - Abnormal; Notable for the  following:       Result Value   WBC, Wet Prep HPF POC MANY (*)    All other components within normal limits  COMPREHENSIVE METABOLIC PANEL - Abnormal; Notable for the following:    Potassium 2.9 (*)    Creatinine, Ser 0.41 (*)    Total Protein 8.6 (*)    All other components within normal limits  URINALYSIS, COMPLETE (UACMP) WITH MICROSCOPIC - Abnormal; Notable for the following:    Color, Urine AMBER (*)    APPearance HAZY (*)    Hgb urine dipstick MODERATE (*)    Nitrite POSITIVE (*)    Leukocytes, UA MODERATE (*)    Bacteria, UA MANY (*)    Squamous Epithelial / LPF 0-5 (*)    All other components within normal limits  URINE CULTURE  CHLAMYDIA/NGC RT PCR ([facility] ONLY)  LIPASE, BLOOD  CBC  POC URINE PREG, ED  POCT PREGNANCY, URINE    ____________________________________________    EKG I,  Lisa Roca, MD, the attending physician have personally viewed and interpreted all ECGs.  none ____________________________________________  RADIOLOGY All Xrays were viewed by me. Imaging interpreted by Radiologist.  Ultrasound transvaginal and pelvic:  IMPRESSION: 7 mm cervical nabothian cyst. No intrauterine mass. Endometrium not thickened. Dominant follicle right ovary. No other extrauterine pelvic mass. No appreciable free fluid. No demonstrable ovarian torsion on either side.  __________________________________________  PROCEDURES  Procedure(s) performed: None  Critical Care performed: None  ____________________________________________   ED COURSE / ASSESSMENT AND PLAN  Pertinent labs & imaging results that were available during my care of the patient were reviewed by me and considered in my medical decision making (see chart for details).   Nausea vomiting and diarrhea initially mostly concerning for likely viral GI illness.  Laboratory studies showed nitrite positive urinary tract infection as well as hypokalemia. She was given 1 dose Rocephin  followed by urine culture and will be discharged with Keflex. She is recently symptomatically controlled here and I think we'll to tolerate by mouth medications at home and I am going to discharge her with Zofran as well.   Ultrasound reassuring. Pelvic exam without evidence for cervicitis. Current Chlamydia testing is pending, but low suspicion.  Tolerating by mouth here. Okay for discharge home and treatment outpatient.  CONSULTATIONS:  None   Patient / Family / Caregiver informed of clinical course, medical decision-making process, and agree with plan.   I discussed return precautions, follow-up instructions, and discharge instructions with patient and/or family.  Discharge Instructions : You were evaluated for lower abdominal pain and found to have urinary tract infection and were given one dose of IV Rocephin antibiotic which covers you for 24 hours. Prescription was provided for Keflex antibiotic.  Start your first dose tomorrow morning.  Return to the emergency department immediately for any worsening abdominal pain, fever, vomiting and cannot keep medication down, or any other symptoms concerning to you.  ___________________________________________   FINAL CLINICAL IMPRESSION(S) / ED DIAGNOSES   Final diagnoses:  Hypokalemia  Urinary tract infection without hematuria, site unspecified              Note: This dictation was prepared with Dragon dictation. Any transcriptional errors that result from this process are unintentional    Lisa Roca, MD 04/03/17 1501

## 2017-04-03 NOTE — ED Notes (Signed)
Pt c/o itching after dilaudid injection. No rash noted, no shortness of breath. Pt states she has similar reaction to percocet which she still takes at home. EDP notified. IV benadryl ordered.

## 2017-04-03 NOTE — ED Triage Notes (Signed)
Patient to ER for c/o pain to mid lower abd. States she has had similar pain since she was 21 years old, but pain had gone away for a while. Patient states this pain feels similar to when she had pain associated with endometriosis. Patient also reports N/V/D (10+ episodes). Denies any known fevers. Denies any urinary symptoms.

## 2017-04-03 NOTE — Discharge Instructions (Signed)
You were evaluated for lower abdominal pain and found to have urinary tract infection and were given one dose of IV Rocephin antibiotic which covers you for 24 hours. Prescription was provided for Keflex antibiotic.  Start your first dose tomorrow morning.  Return to the emergency department immediately for any worsening abdominal pain, fever, vomiting and cannot keep medication down, or any other symptoms concerning to you.

## 2017-04-03 NOTE — ED Notes (Signed)

## 2017-04-05 LAB — URINE CULTURE: Culture: >=100000 — AB

## 2017-04-29 ENCOUNTER — Telehealth: Payer: Self-pay

## 2017-04-29 NOTE — Telephone Encounter (Signed)
Pt called stating she has been having some spotting. Her LMP was 6/30.   I spoke to patient and she states the bleeding is not heavy like her normal cycle. She is on Depo Provera and it was given 3/27. Pt was due 6/14-6/29. She is aware this could be why she is spotting. She is past due for her Depo. I advised her to go and get her Depo from pharmacy and call back for an injection appointment. She is aware a pregnancy test would have to be done. Pt states she will wait until her appointment with Dr. Kenton Kingfisher on Monday to get this done.

## 2017-05-03 ENCOUNTER — Ambulatory Visit: Payer: Medicare Other | Admitting: Obstetrics & Gynecology

## 2017-05-06 ENCOUNTER — Ambulatory Visit: Payer: Medicare Other | Admitting: Obstetrics & Gynecology

## 2017-05-11 ENCOUNTER — Ambulatory Visit: Payer: Medicare Other | Admitting: Obstetrics & Gynecology

## 2017-05-26 ENCOUNTER — Emergency Department
Admission: EM | Admit: 2017-05-26 | Discharge: 2017-05-26 | Disposition: A | Discharge status: Home / Self Care | Payer: Medicare Other | Attending: Emergency Medicine | Admitting: Emergency Medicine

## 2017-05-26 DIAGNOSIS — E039 Hypothyroidism, unspecified: Secondary | ICD-10-CM | POA: Diagnosis not present

## 2017-05-26 DIAGNOSIS — Z79899 Other long term (current) drug therapy: Secondary | ICD-10-CM | POA: Diagnosis not present

## 2017-05-26 DIAGNOSIS — N189 Chronic kidney disease, unspecified: Secondary | ICD-10-CM | POA: Insufficient documentation

## 2017-05-26 DIAGNOSIS — R1030 Lower abdominal pain, unspecified: Secondary | ICD-10-CM

## 2017-05-26 DIAGNOSIS — Z79891 Long term (current) use of opiate analgesic: Secondary | ICD-10-CM | POA: Insufficient documentation

## 2017-05-26 DIAGNOSIS — J45909 Unspecified asthma, uncomplicated: Secondary | ICD-10-CM | POA: Diagnosis not present

## 2017-05-26 DIAGNOSIS — R109 Unspecified abdominal pain: Secondary | ICD-10-CM | POA: Diagnosis present

## 2017-05-26 DIAGNOSIS — R112 Nausea with vomiting, unspecified: Secondary | ICD-10-CM | POA: Insufficient documentation

## 2017-05-26 LAB — COMPREHENSIVE METABOLIC PANEL
ALT: 20 U/L (ref 14–54)
AST: 29 U/L (ref 15–41)
Albumin: 4.6 g/dL (ref 3.5–5.0)
Alkaline Phosphatase: 46 U/L (ref 38–126)
Anion gap: 9 (ref 5–15)
BUN: 6 mg/dL (ref 6–20)
CO2: 24 mmol/L (ref 22–32)
Calcium: 9.6 mg/dL (ref 8.9–10.3)
Chloride: 107 mmol/L (ref 101–111)
Creatinine, Ser: 0.68 mg/dL (ref 0.44–1.00)
GFR calc Af Amer: >60 mL/min (ref >60)
GFR calc non Af Amer: >60 mL/min (ref >60)
Glucose, Bld: 91 mg/dL (ref 65–99)
Potassium: 3.4 mmol/L — ABNORMAL LOW (ref 3.5–5.1)
Sodium: 140 mmol/L (ref 135–145)
Total Bilirubin: 0.7 mg/dL (ref 0.3–1.2)
Total Protein: 8.5 g/dL — ABNORMAL HIGH (ref 6.5–8.1)

## 2017-05-26 LAB — CBC
HCT: 41 % (ref 35.0–47.0)
Hemoglobin: 14.3 g/dL (ref 12.0–16.0)
MCH: 31.9 pg (ref 26.0–34.0)
MCHC: 35 g/dL (ref 32.0–36.0)
MCV: 91.3 fL (ref 80.0–100.0)
Platelets: 292 10*3/uL (ref 150–440)
RBC: 4.49 MIL/uL (ref 3.80–5.20)
RDW: 11.6 % (ref 11.5–14.5)
WBC: 6.4 10*3/uL (ref 3.6–11.0)

## 2017-05-26 LAB — URINALYSIS, COMPLETE (UACMP) WITH MICROSCOPIC
Bilirubin Urine: NEGATIVE
Glucose, UA: NEGATIVE mg/dL
Ketones, ur: NEGATIVE mg/dL
Leukocytes, UA: NEGATIVE
Nitrite: NEGATIVE
Protein, ur: NEGATIVE mg/dL
Specific Gravity, Urine: 1.01 (ref 1.005–1.030)
pH: 5 (ref 5.0–8.0)

## 2017-05-26 LAB — LIPASE, BLOOD: Lipase: 27 U/L (ref 11–51)

## 2017-05-26 MED ORDER — SODIUM CHLORIDE 0.9 % IV BOLUS (SEPSIS)
1000.0000 mL | Freq: Once | INTRAVENOUS | Status: AC
  Administered 2017-05-26: 1000 mL via INTRAVENOUS

## 2017-05-26 MED ORDER — KETOROLAC TROMETHAMINE 30 MG/ML IJ SOLN
15.0000 mg | Freq: Once | INTRAMUSCULAR | Status: AC
  Administered 2017-05-26: 15 mg via INTRAVENOUS

## 2017-05-26 MED ORDER — KETOROLAC TROMETHAMINE 30 MG/ML IJ SOLN
INTRAMUSCULAR | Status: AC
  Administered 2017-05-26: 15 mg via INTRAVENOUS
  Filled 2017-05-26: qty 1

## 2017-05-26 MED ORDER — ONDANSETRON 4 MG PO TBDP: 4.0000 mg | ORAL_TABLET | Freq: Three times a day (TID) | ORAL | 0 refills | Status: DC | PRN

## 2017-05-26 MED ORDER — ONDANSETRON HCL 4 MG/2ML IJ SOLN
INTRAMUSCULAR | Status: AC
  Administered 2017-05-26: 4 mg via INTRAVENOUS
  Filled 2017-05-26: qty 2

## 2017-05-26 MED ORDER — ONDANSETRON HCL 4 MG/2ML IJ SOLN
4.0000 mg | Freq: Once | INTRAMUSCULAR | Status: AC
  Administered 2017-05-26: 4 mg via INTRAVENOUS

## 2017-05-26 NOTE — ED Notes (Signed)
Hard copy of e-signature placed in patient's chart.

## 2017-05-26 NOTE — ED Notes (Signed)
poct pregnancy Negative 

## 2017-05-26 NOTE — ED Provider Notes (Signed)
[facility] Emergency Department Provider Note  Time seen: 4:36 PM  I have reviewed the triage vital signs and the nursing notes.   HISTORY  Chief Complaint Abdominal Pain    HPI ABBEE Shaw is a 21 y.o. female with a past medical history of gastric reflux, somewhat chronic abdominal discomfort, presents to the emergency department for nausea and vomiting. According to the patient since yesterday she has been experiencing lower abdominal discomfort. Patient has a history of endometriosis however states her period ended approximately one week ago. States over the past 2 days she has been experiencing nausea or vomiting and low-grade fever. Denies any dysuria, denies any vaginal bleeding or discharge. Patient does state moderate diarrhea as well.  Past Medical History:  Diagnosis Date  . Asthma    WELL CONTROLLED  . Chronic kidney disease    H/O STONES  . Endometriosis   . GERD (gastroesophageal reflux disease)   . Headache   . Hypothyroidism   . Thyroid disease     Patient Active Problem List   Diagnosis Date Noted  . Right lower quadrant abdominal pain 01/19/2017    Past Surgical History:  Procedure Laterality Date  . APPENDECTOMY    . ENDOMETRIAL BIOPSY    . LAPAROSCOPY N/A 03/13/2016   Procedure: LAPAROSCOPY DIAGNOSTIC;  Surgeon: Honor Loh Ward, MD;  Location: [facility] ORS;  Service: Gynecology;  Laterality: N/A;    Prior to Admission medications   Medication Sig Start Date End Date Taking? Authorizing Provider  cephALEXin (KEFLEX) 500 MG capsule Take 1 capsule (500 mg total) by mouth 2 (two) times daily. 04/03/17   Lisa Roca, MD  clonazePAM (KLONOPIN) 1 MG tablet Take 1 mg by mouth 2 (two) times daily as needed.    [provider]  FLUoxetine (PROZAC) 20 MG capsule Take 1 capsule by mouth daily. 08/27/16   [provider]  ibuprofen (ADVIL,MOTRIN) 600 MG tablet Take 1 tablet (600 mg total) by mouth every 6 (six) hours as  needed. 12/29/16   Harvest Dark, MD  ipratropium (ATROVENT HFA) 17 MCG/ACT inhaler Inhale 2 puffs into the lungs every 6 (six) hours as needed.     [provider]  levothyroxine (SYNTHROID, LEVOTHROID) 25 MCG tablet Take 25 mcg by mouth daily before breakfast.     [provider]  medroxyPROGESTERone (DEPO-PROVERA) 150 MG/ML injection Inject 150 mg into the muscle every 3 (three) months.    [provider]  ondansetron (ZOFRAN ODT) 4 MG disintegrating tablet Take 1 tablet (4 mg total) by mouth every 8 (eight) hours as needed for nausea or vomiting. Patient not taking: Reported on 01/19/2017 11/20/16   Letitia Neri L, PA-C  ondansetron (ZOFRAN) 4 MG tablet Take 1 tablet (4 mg total) by mouth every 8 (eight) hours as needed for nausea or vomiting. 04/03/17   Lisa Roca, MD  oxyCODONE-acetaminophen (PERCOCET) 7.5-325 MG tablet Take 1 tablet by mouth 2 (two) times daily. 09/24/16   [provider]  PROAIR HFA 108 (90 Base) MCG/ACT inhaler Inhale 2 puffs into the lungs every 4 (four) hours as needed. 08/28/16   [provider]    Allergies  Allergen Reactions  . Morphine And Related Hives and Shortness Of Breath  . Fentanyl     PT DENIES THIS ALLERGY DURING PHONE INTERVIEW ON 03-05-16  . Betadine [Povidone Iodine] Rash  . Iodine Rash    Family History  Problem Relation Age of Onset  . Cervical cancer Mother   .  Lung cancer Paternal Uncle     Social History Social History  Substance Use Topics  . Smoking status: Never Smoker  . Smokeless tobacco: Never Used  . Alcohol use No    Review of Systems Constitutional: Subjective fever at home. Cardiovascular: Negative for chest pain. Respiratory: Negative for shortness of breath. Gastrointestinal: Lower abdominal discomfort. Positive for nausea vomiting and diarrhea. Genitourinary: Negative for dysuria. Negative for vaginal bleeding. Negative vaginal discharge. Musculoskeletal: Negative for  back pain. Neurological: Negative for headache All other ROS negative  ____________________________________________   PHYSICAL EXAM:  VITAL SIGNS: ED Triage Vitals [05/26/17 1514]  Enc Vitals Group     BP 109/73     Pulse Rate (!) 112     Resp 16     Temp 99.7 F (37.6 C)     Temp Source Oral     SpO2 100 %     Weight 99 lb (44.9 kg)     Height 5\' 2"  (1.575 m)     Head Circumference      Peak Flow      Pain Score 10     Pain Loc      Pain Edu?      Excl. in [place]?     Constitutional: Alert and oriented. Well appearing and in no distress. Eyes: Normal exam ENT   Head: Normocephalic and atraumatic.   Mouth/Throat: Mucous membranes are moist. Cardiovascular: Normal rate, regular rhythm. No murmur Respiratory: Normal respiratory effort without tachypnea nor retractions. Breath sounds are clear  Gastrointestinal: Soft, moderate periumbilical tenderness palpation. No rebound or guarding. No distention. Abdomen otherwise benign. No right lower quadrant pain. Musculoskeletal: Nontender with normal range of motion in all extremities.  Neurologic:  Normal speech and language. No gross focal neurologic deficits  Skin:  Skin is warm, dry and intact.  Psychiatric: Mood and affect are normal.   ____________________________________________   INITIAL IMPRESSION / ASSESSMENT AND PLAN / ED COURSE  Pertinent labs & imaging results that were available during my care of the patient were reviewed by me and considered in my medical decision making (see chart for details).  The patient presents to the emergency department for 2 days of nausea vomiting diarrhea with periumbilical abdominal pain. Patient states a history of somewhat chronic abdominal pain with multiple ER visits in the past. Patient does state low-grade fever with nausea and vomiting. Patient has a 99.7 temperature in the emergency department with mild tachycardia. Patient does have moderate periumbilical tenderness.  Patient's labs however completely normal. White blood cell count of 6, normal LFTs and lipase. Urinalysis is negative. Patient denies any vaginal bleeding or discharge. Given the patient's nausea and vomiting we will place an IV, treat with Zofran and Toradol and IV fluids. I suspect likely gastroenteritis.  ____________________________________________   FINAL CLINICAL IMPRESSION(S) / ED DIAGNOSES  Nausea vomiting diarrhea Abdominal pain    Harvest Dark, MD 05/26/17 1724

## 2017-05-26 NOTE — ED Triage Notes (Signed)
Pt has lower abd pain with n/v/d.  Sx for 2 days with worsening pain.  No urinary sx.  Pt alert.

## 2017-05-26 NOTE — ED Notes (Signed)
Pt called out stating she was still in pain. MD notified.

## 2017-06-04 ENCOUNTER — Telehealth: Payer: Self-pay

## 2017-06-04 ENCOUNTER — Encounter: Payer: Self-pay | Admitting: Obstetrics & Gynecology

## 2017-06-04 ENCOUNTER — Ambulatory Visit (INDEPENDENT_AMBULATORY_CARE_PROVIDER_SITE_OTHER): Payer: Medicare Other | Admitting: Obstetrics & Gynecology

## 2017-06-04 VITALS — BP 100/70 | HR 94 | Ht 61.0 in | Wt 96.0 lb

## 2017-06-04 DIAGNOSIS — R1032 Left lower quadrant pain: Secondary | ICD-10-CM | POA: Diagnosis not present

## 2017-06-04 NOTE — Progress Notes (Signed)
Gynecology Pelvic Pain Evaluation   Chief Complaint:  Chief Complaint  Patient presents with  . Abdominal Pain    been seen at hospital for left side pain    History of Present Illness:   Patient is a 21 y.o. No obstetric history on file. who LMP was Patient's last menstrual period was 05/23/2017 (approximate)., presents today for a problem visit.  She complains of pain.   Her pain is localized to the LLQ area, described as constant and burning, began several weeks ago and its severity is described as severe. The pain radiates to the  Non-radiating. She has these associated symptoms which include prior right sided pain, now its left sided; multiple visits (ED, here, other) and [place] in past; lap last year with no endomtriosis or other abnormalities.. Patient has these modifiers which include relaxation and pain medication that make it better and unable to associate with any factor that make it worse.  Context includes: spontaneous.  She notes Positive menstrual cramping.  Previous evaluation: emergency room visit on 8/1 and in June, office visit in March and ultrasound showing no cyst or mass. Prior Diagnosis: none. Previous Treatment: NSAID and prioir narcotics; prior Depo but did not tolerate; desires pregnancy so no BC now.  PMHx: She  has a past medical history of Asthma; Chronic kidney disease; Endometriosis; GERD (gastroesophageal reflux disease); Headache; Hypothyroidism; and Thyroid disease. Also,  has a past surgical history that includes Appendectomy; Endometrial biopsy; and laparoscopy (N/A, 03/13/2016)., family history includes Cervical cancer in her mother; Lung cancer in her paternal uncle.,  reports that she has never smoked. She has never used smokeless tobacco. She reports that she does not drink alcohol or use drugs.  She has a current medication list which includes the following prescription(s): clonazepam, fluoxetine, ibuprofen, levothyroxine, oxycodone-acetaminophen,  cephalexin, ipratropium, medroxyprogesterone, ondansetron, ondansetron, and proair hfa. Also, is allergic to morphine and related; fentanyl; betadine [povidone iodine]; and iodine.  Review of Systems  Constitutional: Negative for chills, fever and malaise/fatigue.  HENT: Negative for congestion, sinus pain and sore throat.   Eyes: Negative for blurred vision and pain.  Respiratory: Negative for cough and wheezing.   Cardiovascular: Negative for chest pain and leg swelling.  Gastrointestinal: Negative for abdominal pain, constipation, diarrhea, heartburn, nausea and vomiting.  Genitourinary: Negative for dysuria, frequency, hematuria and urgency.  Musculoskeletal: Negative for back pain, joint pain, myalgias and neck pain.  Skin: Negative for itching and rash.  Neurological: Negative for dizziness, tremors and weakness.  Endo/Heme/Allergies: Does not bruise/bleed easily.  Psychiatric/Behavioral: Negative for depression. The patient is not nervous/anxious and does not have insomnia.     Objective: BP 100/70   Pulse 94   Ht 5\' 1"  (1.549 m)   Wt 96 lb (43.5 kg)   LMP 05/23/2017 (Approximate)   BMI 18.14 kg/m  Physical Exam  Constitutional: She is oriented to person, place, and time. She appears well-developed and well-nourished. No distress.  Genitourinary: Rectum normal, vagina normal and uterus normal. Pelvic exam was performed with patient supine. There is no rash or lesion on the right labia. There is no rash or lesion on the left labia. Vagina exhibits no lesion. No bleeding in the vagina. Right adnexum does not display mass and does not display tenderness. Left adnexum does not display mass and does not display tenderness. Cervix does not exhibit motion tenderness, lesion, friability or polyp.   Uterus is mobile and midaxial. Uterus is not enlarged or exhibiting a mass.  HENT:  Head:  Normocephalic and atraumatic. Head is without laceration.  Right Ear: Hearing normal.  Left Ear:  Hearing normal.  Nose: No epistaxis.  No foreign bodies.  Mouth/Throat: Uvula is midline, oropharynx is clear and moist and mucous membranes are normal.  Eyes: Pupils are equal, round, and reactive to light.  Neck: Normal range of motion. Neck supple. No thyromegaly present.  Cardiovascular: Normal rate and regular rhythm.  Exam reveals no gallop and no friction rub.   No murmur heard. Pulmonary/Chest: Effort normal and breath sounds normal. No respiratory distress. She has no wheezes. Right breast exhibits no mass, no skin change and no tenderness. Left breast exhibits no mass, no skin change and no tenderness.  Abdominal: Soft. Bowel sounds are normal. She exhibits no distension. There is no tenderness. There is no rebound.  Musculoskeletal: Normal range of motion.  Neurological: She is alert and oriented to person, place, and time. No cranial nerve deficit.  Skin: Skin is warm and dry.  Psychiatric: She has a normal mood and affect. Judgment normal.  Vitals reviewed.   Female chaperone present for pelvic portion of the physical exam  Assessment: 21 y.o. G1P1with no s/sx GYN etiology.  1. Left lower quadrant pain Monitor. See PCP, non-GYN etiology.  See above for details of prior work up and neg findings. Consider OCP or patch for BC/ hormone control.  Barnett Applebaum, MD, [facility], [facility] 06/04/2017  11:34 AM

## 2017-06-04 NOTE — Telephone Encounter (Signed)
Attempted to contact patient again to see if I could relay info to mother from visit. Received voicemail immediately again.

## 2017-06-04 NOTE — Telephone Encounter (Signed)
Pt returned call stating she apologizes for missing the call. Still only states the her mother would like to speak to [facility] regarding today's apt. Cb#[phone].

## 2017-06-04 NOTE — Telephone Encounter (Signed)
Pt called triage stating that her mother would like for [facility] to call her regarding today's appt. Left msg for pt to call back.

## 2017-06-04 NOTE — Telephone Encounter (Signed)
Pt left additional message on triage at 3:35 stating that her mother would like to speak to [facility].

## 2017-06-07 ENCOUNTER — Encounter: Payer: Self-pay | Admitting: Emergency Medicine

## 2017-06-07 ENCOUNTER — Emergency Department
Admission: EM | Admit: 2017-06-07 | Discharge: 2017-06-07 | Disposition: A | Discharge status: Home / Self Care | Payer: Medicare Other | Attending: Emergency Medicine | Admitting: Emergency Medicine

## 2017-06-07 DIAGNOSIS — R1030 Lower abdominal pain, unspecified: Secondary | ICD-10-CM | POA: Diagnosis present

## 2017-06-07 DIAGNOSIS — R112 Nausea with vomiting, unspecified: Secondary | ICD-10-CM | POA: Diagnosis not present

## 2017-06-07 DIAGNOSIS — J45909 Unspecified asthma, uncomplicated: Secondary | ICD-10-CM | POA: Diagnosis not present

## 2017-06-07 LAB — COMPREHENSIVE METABOLIC PANEL
ALT: 18 U/L (ref 14–54)
AST: 22 U/L (ref 15–41)
Albumin: 4.3 g/dL (ref 3.5–5.0)
Alkaline Phosphatase: 44 U/L (ref 38–126)
Anion gap: 7 (ref 5–15)
BUN: 6 mg/dL (ref 6–20)
CO2: 27 mmol/L (ref 22–32)
Calcium: 9.4 mg/dL (ref 8.9–10.3)
Chloride: 108 mmol/L (ref 101–111)
Creatinine, Ser: 0.7 mg/dL (ref 0.44–1.00)
GFR calc Af Amer: >60 mL/min (ref >60)
GFR calc non Af Amer: >60 mL/min (ref >60)
Glucose, Bld: 76 mg/dL (ref 65–99)
Potassium: 4.1 mmol/L (ref 3.5–5.1)
Sodium: 142 mmol/L (ref 135–145)
Total Bilirubin: 0.6 mg/dL (ref 0.3–1.2)
Total Protein: 7.6 g/dL (ref 6.5–8.1)

## 2017-06-07 LAB — CBC
HCT: 38 % (ref 35.0–47.0)
Hemoglobin: 13.3 g/dL (ref 12.0–16.0)
MCH: 31.7 pg (ref 26.0–34.0)
MCHC: 35.1 g/dL (ref 32.0–36.0)
MCV: 90.5 fL (ref 80.0–100.0)
Platelets: 250 10*3/uL (ref 150–440)
RBC: 4.2 MIL/uL (ref 3.80–5.20)
RDW: 11.7 % (ref 11.5–14.5)
WBC: 6.7 10*3/uL (ref 3.6–11.0)

## 2017-06-07 LAB — URINALYSIS, COMPLETE (UACMP) WITH MICROSCOPIC
Bacteria, UA: NONE SEEN
Bilirubin Urine: NEGATIVE
Glucose, UA: NEGATIVE mg/dL
Ketones, ur: NEGATIVE mg/dL
Leukocytes, UA: NEGATIVE
Nitrite: NEGATIVE
Protein, ur: NEGATIVE mg/dL
Specific Gravity, Urine: 1.018 (ref 1.005–1.030)
pH: 7 (ref 5.0–8.0)

## 2017-06-07 LAB — POCT PREGNANCY, URINE: Preg Test, Ur: NEGATIVE

## 2017-06-07 LAB — LIPASE, BLOOD: Lipase: 30 U/L (ref 11–51)

## 2017-06-07 MED ORDER — FAMOTIDINE 20 MG PO TABS
40.0000 mg | ORAL_TABLET | Freq: Once | ORAL | Status: AC
  Administered 2017-06-07: 40 mg via ORAL
  Filled 2017-06-07: qty 2

## 2017-06-07 MED ORDER — ONDANSETRON HCL 4 MG PO TABS: 4.0000 mg | ORAL_TABLET | Freq: Every day | ORAL | 0 refills | Status: DC | PRN

## 2017-06-07 MED ORDER — ONDANSETRON 4 MG PO TBDP
4.0000 mg | ORAL_TABLET | Freq: Once | ORAL | Status: AC
  Administered 2017-06-07: 4 mg via ORAL
  Filled 2017-06-07: qty 1

## 2017-06-07 NOTE — ED Triage Notes (Signed)
Pt reports lower abdominal pain since Friday after falling out of bed. Pt reports hx of endometriosis. No urinary sx.

## 2017-06-07 NOTE — ED Notes (Addendum)
First Nurse: pt presents with abd pain.

## 2017-06-07 NOTE — ED Provider Notes (Addendum)
[facility] Emergency Department Provider Note   ____________________________________________   First MD Initiated Contact with Patient 06/07/17 1830     (approximate)  I have reviewed the triage vital signs and the nursing notes.   HISTORY  Chief Complaint Abdominal Pain   HPI Hannah Shaw is a 21 y.o. female with a history of endometriosis as well as chronic abdominal pain was presenting to the emergency department today with lower abdominal pain that started this Friday after falling from her bed. She said that she fell flat onto the floor and did not hit a table or otherwise any sharp object and went directly into her abdomen. She says the abdominal pain is lower and approaching a 10 out of 10 at this time. She denies any radiation. Denies any vaginal bleeding or discharge. Does not report any dysuria. Says that she has upped his 60 episodes of vomiting per day and is out of her home antinausea medicines at this time.   Past Medical History:  Diagnosis Date  . Asthma    WELL CONTROLLED  . Chronic kidney disease    H/O STONES  . Endometriosis   . GERD (gastroesophageal reflux disease)   . Headache   . Hypothyroidism   . Thyroid disease     There are no active problems to display for this patient.   Past Surgical History:  Procedure Laterality Date  . APPENDECTOMY    . ENDOMETRIAL BIOPSY    . LAPAROSCOPY N/A 03/13/2016   Procedure: LAPAROSCOPY DIAGNOSTIC;  Surgeon: Honor Loh Ward, MD;  Location: [facility] ORS;  Service: Gynecology;  Laterality: N/A;    Prior to Admission medications   Medication Sig Start Date End Date Taking? Authorizing Provider  cephALEXin (KEFLEX) 500 MG capsule Take 1 capsule (500 mg total) by mouth 2 (two) times daily. Patient not taking: Reported on 06/04/2017 04/03/17   Lisa Roca, MD  clonazePAM (KLONOPIN) 1 MG tablet Take 1 mg by mouth 2 (two) times daily as needed.    [provider]  FLUoxetine (PROZAC)  20 MG capsule Take 1 capsule by mouth daily. 08/27/16   [provider]  ibuprofen (ADVIL,MOTRIN) 600 MG tablet Take 1 tablet (600 mg total) by mouth every 6 (six) hours as needed. 12/29/16   Harvest Dark, MD  ipratropium (ATROVENT HFA) 17 MCG/ACT inhaler Inhale 2 puffs into the lungs every 6 (six) hours as needed.     [provider]  levothyroxine (SYNTHROID, LEVOTHROID) 25 MCG tablet Take 25 mcg by mouth daily before breakfast.     [provider]  medroxyPROGESTERone (DEPO-PROVERA) 150 MG/ML injection Inject 150 mg into the muscle every 3 (three) months.    [provider]  ondansetron (ZOFRAN ODT) 4 MG disintegrating tablet Take 1 tablet (4 mg total) by mouth every 8 (eight) hours as needed for nausea or vomiting. Patient not taking: Reported on 06/04/2017 05/26/17   Harvest Dark, MD  ondansetron (ZOFRAN) 4 MG tablet Take 1 tablet (4 mg total) by mouth every 8 (eight) hours as needed for nausea or vomiting. Patient not taking: Reported on 06/04/2017 04/03/17   Lisa Roca, MD  oxyCODONE-acetaminophen (PERCOCET) 7.5-325 MG tablet Take 1 tablet by mouth 2 (two) times daily. 09/24/16   [provider]  PROAIR HFA 108 (90 Base) MCG/ACT inhaler Inhale 2 puffs into the lungs every 4 (four) hours as needed. 08/28/16   [provider]    Allergies Morphine and related; Fentanyl; Betadine [povidone iodine]; and Iodine  Family History  Problem Relation Age of Onset  . Cervical cancer Mother   . Lung cancer Paternal Uncle     Social History Social History  Substance Use Topics  . Smoking status: Never Smoker  . Smokeless tobacco: Never Used  . Alcohol use No    Review of Systems  Constitutional: No fever/chills Eyes: No visual changes. ENT: No sore throat. Cardiovascular: Denies chest pain. Respiratory: Denies shortness of breath. Gastrointestinal: No diarrhea.  No constipation. Genitourinary: Negative for  dysuria. Musculoskeletal: Negative for back pain. Skin: Negative for rash. Neurological: Negative for headaches, focal weakness or numbness.   ____________________________________________   PHYSICAL EXAM:  VITAL SIGNS: ED Triage Vitals  Enc Vitals Group     BP 06/07/17 1800 126/70     Pulse Rate 06/07/17 1800 (!) 108     Resp 06/07/17 1800 16     Temp 06/07/17 1800 99.4 F (37.4 C)     Temp Source 06/07/17 1800 Oral     SpO2 06/07/17 1800 100 %     Weight 06/07/17 1801 99 lb (44.9 kg)     Height 06/07/17 1801 5\' 1"  (1.549 m)     Head Circumference --      Peak Flow --      Pain Score 06/07/17 1800 10     Pain Loc --      Pain Edu? --      Excl. in [place]? --     Constitutional: Alert and oriented. Well appearing and in no acute distress. Eyes: Conjunctivae are normal.  Head: Atraumatic. Nose: No congestion/rhinnorhea. Mouth/Throat: Mucous membranes are moist.  Neck: No stridor.   Cardiovascular: Normal rate, regular rhythm. Grossly normal heart sounds.   Respiratory: Normal respiratory effort.  No retractions. Lungs CTAB. Gastrointestinal: Soft and nontender. No distention. No CVA tenderness. Musculoskeletal: No lower extremity tenderness nor edema.  No joint effusions. Neurologic:  Normal speech and language. No gross focal neurologic deficits are appreciated. Skin:  Skin is warm, dry and intact. No rash noted. Psychiatric: Mood and affect are normal. Speech and behavior are normal.  ____________________________________________   LABS (all labs ordered are listed, but only abnormal results are displayed)  Labs Reviewed  URINALYSIS, COMPLETE (UACMP) WITH MICROSCOPIC - Abnormal; Notable for the following:       Result Value   Color, Urine YELLOW (*)    APPearance HAZY (*)    Hgb urine dipstick SMALL (*)    Squamous Epithelial / LPF 0-5 (*)    All other components within normal limits  LIPASE, BLOOD  COMPREHENSIVE METABOLIC PANEL  CBC  POC URINE PREG, ED  POCT  PREGNANCY, URINE   ____________________________________________  EKG   ____________________________________________  RADIOLOGY   ____________________________________________   PROCEDURES  Procedure(s) performed:   Procedures  Critical Care performed:   ____________________________________________   INITIAL IMPRESSION / ASSESSMENT AND PLAN / ED COURSE  Pertinent labs & imaging results that were available during my care of the patient were reviewed by me and considered in my medical decision making (see chart for details).  Reassuring blood work after multiple days of pain, nausea and vomiting. If there was a severe intra-abdominal injury to expect to see lab abnormalities or worse vital signs then were present today. Appears patient does have a baseline tachycardia which had her last visit for abdominal pain earlier this month. We will give the patient by mouth medications, a by mouth challenge and reassess.    ----------------------------------------- 8:47 PM on 06/07/2017 -----------------------------------------  Patient  was able to tolerate a small amount of graham crackers. She has not vomited since being in the emergency department. Patient likely with chronic abdominal pain and nausea and vomiting. I did not see any signs of trauma. Labs as well as vital signs are reassuring. On my reevaluation the patient's heart rate was 86 bpm. She says that she has continued pain. However, the timeline does not fit for severe abdominal injury given the patient's reassuring vital signs and lab work. Clinical presentation seems chronic. Patient says that she is out of Zofran at home. I'll give her Zofran prescription for outpatient use. Patient understands plan and willing to comply.  ____________________________________________   FINAL CLINICAL IMPRESSION(S) / ED DIAGNOSES  Abdominal pain with nausea and vomiting.    NEW MEDICATIONS STARTED DURING THIS VISIT:  New  Prescriptions   No medications on file     Note:  This document was prepared using Dragon voice recognition software and may include unintentional dictation errors.     Orbie Pyo, MD 06/07/17 2048  Patient's prescription for Zofran was not added to her discharge paperwork. I called the patient's cell phone and left a voicemail that the prescription would be waiting for the patient at the front desk. I then left the prescription at the front desk with staff who are aware of the plan for patient pickup.    Orbie Pyo, MD 06/07/17 2229

## 2017-06-07 NOTE — ED Notes (Signed)
Patient reports lower abdominal pain/pelvic pain that is chronic due to endometriosis but worse since Friday when patient fell.  Patient is in no obvious distress at this time.  Patient takes percocet regularly for pain from endometriosis.

## 2017-06-07 NOTE — ED Notes (Signed)

## 2017-06-07 NOTE — ED Notes (Signed)
Patient given water and crackers for PO challenge.

## 2017-06-22 ENCOUNTER — Encounter: Payer: Self-pay | Admitting: Emergency Medicine

## 2017-06-22 ENCOUNTER — Emergency Department: Payer: Medicare Other

## 2017-06-22 ENCOUNTER — Emergency Department
Admission: EM | Admit: 2017-06-22 | Discharge: 2017-06-22 | Disposition: A | Discharge status: Home / Self Care | Payer: Medicare Other | Attending: Emergency Medicine | Admitting: Emergency Medicine

## 2017-06-22 DIAGNOSIS — J45909 Unspecified asthma, uncomplicated: Secondary | ICD-10-CM | POA: Insufficient documentation

## 2017-06-22 DIAGNOSIS — Z79891 Long term (current) use of opiate analgesic: Secondary | ICD-10-CM | POA: Insufficient documentation

## 2017-06-22 DIAGNOSIS — Z79899 Other long term (current) drug therapy: Secondary | ICD-10-CM | POA: Diagnosis not present

## 2017-06-22 DIAGNOSIS — B349 Viral infection, unspecified: Secondary | ICD-10-CM | POA: Insufficient documentation

## 2017-06-22 DIAGNOSIS — R531 Weakness: Secondary | ICD-10-CM | POA: Diagnosis present

## 2017-06-22 DIAGNOSIS — E039 Hypothyroidism, unspecified: Secondary | ICD-10-CM | POA: Diagnosis not present

## 2017-06-22 LAB — URINALYSIS, COMPLETE (UACMP) WITH MICROSCOPIC
Bilirubin Urine: NEGATIVE
Glucose, UA: NEGATIVE mg/dL
Ketones, ur: NEGATIVE mg/dL
Leukocytes, UA: NEGATIVE
Nitrite: NEGATIVE
Protein, ur: NEGATIVE mg/dL
Specific Gravity, Urine: 1.009 (ref 1.005–1.030)
pH: 6 (ref 5.0–8.0)

## 2017-06-22 LAB — COMPREHENSIVE METABOLIC PANEL
ALT: 20 U/L (ref 14–54)
AST: 23 U/L (ref 15–41)
Albumin: 4.6 g/dL (ref 3.5–5.0)
Alkaline Phosphatase: 40 U/L (ref 38–126)
Anion gap: 7 (ref 5–15)
BUN: <5 mg/dL — ABNORMAL LOW (ref 6–20)
CO2: 25 mmol/L (ref 22–32)
Calcium: 9.2 mg/dL (ref 8.9–10.3)
Chloride: 106 mmol/L (ref 101–111)
Creatinine, Ser: 0.64 mg/dL (ref 0.44–1.00)
GFR calc Af Amer: >60 mL/min (ref >60)
GFR calc non Af Amer: >60 mL/min (ref >60)
Glucose, Bld: 92 mg/dL (ref 65–99)
Potassium: 3.2 mmol/L — ABNORMAL LOW (ref 3.5–5.1)
Sodium: 138 mmol/L (ref 135–145)
Total Bilirubin: 0.8 mg/dL (ref 0.3–1.2)
Total Protein: 7.8 g/dL (ref 6.5–8.1)

## 2017-06-22 LAB — CBC WITH DIFFERENTIAL/PLATELET
Basophils Absolute: 0.1 10*3/uL (ref 0–0.1)
Basophils Relative: 1 %
Eosinophils Absolute: 0.1 10*3/uL (ref 0–0.7)
Eosinophils Relative: 2 %
HCT: 38.1 % (ref 35.0–47.0)
Hemoglobin: 13.4 g/dL (ref 12.0–16.0)
Lymphocytes Relative: 36 %
Lymphs Abs: 2.5 10*3/uL (ref 1.0–3.6)
MCH: 32.1 pg (ref 26.0–34.0)
MCHC: 35.1 g/dL (ref 32.0–36.0)
MCV: 91.4 fL (ref 80.0–100.0)
Monocytes Absolute: 0.4 10*3/uL (ref 0.2–0.9)
Monocytes Relative: 6 %
Neutro Abs: 3.7 10*3/uL (ref 1.4–6.5)
Neutrophils Relative %: 55 %
Platelets: 256 10*3/uL (ref 150–440)
RBC: 4.17 MIL/uL (ref 3.80–5.20)
RDW: 11.9 % (ref 11.5–14.5)
WBC: 6.8 10*3/uL (ref 3.6–11.0)

## 2017-06-22 LAB — AMYLASE: Amylase: 54 U/L (ref 28–100)

## 2017-06-22 LAB — TSH: TSH: 1.825 u[IU]/mL (ref 0.350–4.500)

## 2017-06-22 LAB — POCT PREGNANCY, URINE: Preg Test, Ur: NEGATIVE

## 2017-06-22 LAB — LIPASE, BLOOD: Lipase: 21 U/L (ref 11–51)

## 2017-06-22 MED ORDER — SODIUM CHLORIDE 0.9 % IV BOLUS (SEPSIS)
1000.0000 mL | Freq: Once | INTRAVENOUS | Status: AC
  Administered 2017-06-22: 1000 mL via INTRAVENOUS

## 2017-06-22 MED ORDER — ONDANSETRON HCL 4 MG PO TABS: 4.0000 mg | ORAL_TABLET | Freq: Three times a day (TID) | ORAL | 1 refills | Status: AC | PRN

## 2017-06-22 MED ORDER — ACETAMINOPHEN 325 MG PO TABS
650.0000 mg | ORAL_TABLET | Freq: Once | ORAL | Status: AC
  Administered 2017-06-22: 650 mg via ORAL
  Filled 2017-06-22: qty 2

## 2017-06-22 NOTE — ED Triage Notes (Signed)
URI sx X 2 days.

## 2017-06-22 NOTE — ED Provider Notes (Signed)
[facility] Emergency Department Provider Note  ____________________________________________  Time seen: Approximately 6:07 PM  I have reviewed the triage vital signs and the nursing notes.   HISTORY  Chief Complaint URI    HPI Hannah Shaw is a 21 y.o. female presenting to the emergency department with "weakness" for the past 2 days. Patient states that she has vomited approximately "60 times" over the past 2 days. Patient is complaining of epigastric abdominal pain. She denies hemoptysis. Patient also states that she has had nonproductive cough for the past 2 days. Patient states that she feels so weak that she "cannot walk". She denies chest pain, chest tightness and shortness of breath. No alleviating measures have been attempted.   Past Medical History:  Diagnosis Date  . Asthma    WELL CONTROLLED  . Chronic kidney disease    H/O STONES  . Endometriosis   . GERD (gastroesophageal reflux disease)   . Headache   . Hypothyroidism   . Thyroid disease     There are no active problems to display for this patient.   Past Surgical History:  Procedure Laterality Date  . APPENDECTOMY    . ENDOMETRIAL BIOPSY    . LAPAROSCOPY N/A 03/13/2016   Procedure: LAPAROSCOPY DIAGNOSTIC;  Surgeon: Honor Loh Ward, MD;  Location: [facility] ORS;  Service: Gynecology;  Laterality: N/A;    Prior to Admission medications   Medication Sig Start Date End Date Taking? Authorizing Provider  cephALEXin (KEFLEX) 500 MG capsule Take 1 capsule (500 mg total) by mouth 2 (two) times daily. Patient not taking: Reported on 06/04/2017 04/03/17   Lisa Roca, MD  clonazePAM (KLONOPIN) 1 MG tablet Take 1 mg by mouth 2 (two) times daily as needed.    [provider]  FLUoxetine (PROZAC) 20 MG capsule Take 1 capsule by mouth daily. 08/27/16   [provider]  ibuprofen (ADVIL,MOTRIN) 600 MG tablet Take 1 tablet (600 mg total) by mouth every 6 (six) hours as needed. 12/29/16    Harvest Dark, MD  ipratropium (ATROVENT HFA) 17 MCG/ACT inhaler Inhale 2 puffs into the lungs every 6 (six) hours as needed.     [provider]  levothyroxine (SYNTHROID, LEVOTHROID) 25 MCG tablet Take 25 mcg by mouth daily before breakfast.     [provider]  medroxyPROGESTERone (DEPO-PROVERA) 150 MG/ML injection Inject 150 mg into the muscle every 3 (three) months.    [provider]  ondansetron (ZOFRAN ODT) 4 MG disintegrating tablet Take 1 tablet (4 mg total) by mouth every 8 (eight) hours as needed for nausea or vomiting. Patient not taking: Reported on 06/04/2017 05/26/17   Harvest Dark, MD  ondansetron (ZOFRAN) 4 MG tablet Take 1 tablet (4 mg total) by mouth every 8 (eight) hours as needed for nausea or vomiting. 06/22/17 06/24/17  Lannie Fields, PA-C  oxyCODONE-acetaminophen (PERCOCET) 7.5-325 MG tablet Take 1 tablet by mouth 2 (two) times daily. 09/24/16   [provider]  PROAIR HFA 108 (90 Base) MCG/ACT inhaler Inhale 2 puffs into the lungs every 4 (four) hours as needed. 08/28/16   [provider]    Allergies Morphine and related; Fentanyl; Betadine [povidone iodine]; and Iodine  Family History  Problem Relation Age of Onset  . Cervical cancer Mother   . Lung cancer Paternal Uncle     Social History Social History  Substance Use Topics  . Smoking status: Never Smoker  . Smokeless tobacco: Never Used  . Alcohol use No  Review of Systems  Constitutional: Patient has fever Eyes: No visual changes. No discharge ENT: No upper respiratory complaints. Cardiovascular: no chest pain. Respiratory: Patient has nonproductive cough. Gastrointestinal: Patient has epigastric abdominal pain, nausea and vomiting. Genitourinary: Negative for dysuria. No hematuria Musculoskeletal: Negative for musculoskeletal pain. Skin: Negative for rash, abrasions, lacerations, ecchymosis. Neurological: Negative for headaches, focal  weakness or numbness.  ____________________________________________   PHYSICAL EXAM:  VITAL SIGNS: ED Triage Vitals  Enc Vitals Group     BP 06/22/17 1750 116/74     Pulse Rate 06/22/17 1750 (!) 116     Resp 06/22/17 1750 20     Temp 06/22/17 1750 (!) 101.1 F (38.4 C)     Temp Source 06/22/17 1750 Oral     SpO2 06/22/17 1750 100 %     Weight 06/22/17 1751 99 lb (44.9 kg)     Height 06/22/17 1751 5\' 1"  (1.549 m)     Head Circumference --      Peak Flow --      Pain Score 06/22/17 1750 9     Pain Loc --      Pain Edu? --      Excl. in [place]? --      Constitutional: Alert and oriented. Patient is playing on her cell phone. She does not make eye contact. Eyes: Conjunctivae are normal. PERRL. EOMI. Head: Atraumatic. ENT:      Ears: Tympanic membranes are effused bilaterally.      Nose: No congestion/rhinnorhea.      Mouth/Throat: Mucous membranes are moist. Posterior pharynx is nonerythematous. Cardiovascular: Normal rate, regular rhythm. Normal S1 and S2.  Good peripheral circulation. Respiratory: Normal respiratory effort without tachypnea or retractions. Lungs CTAB. Good air entry to the bases with no decreased or absent breath sounds. Gastrointestinal: Bowel sounds 4 quadrants. Patient has pain with epigastric palpation. No guarding or rigidity.  No palpable masses. No distention. No CVA tenderness. Musculoskeletal: Full range of motion to all extremities. No gross deformities appreciated. Neurologic:  Normal speech and language. No gross focal neurologic deficits are appreciated.  Skin:  Skin is warm, dry and intact. No rash noted. Psychiatric: Mood and affect are normal. Speech and behavior are normal. Patient exhibits appropriate insight and judgement.   ____________________________________________   LABS (all labs ordered are listed, but only abnormal results are displayed)  Labs Reviewed  COMPREHENSIVE METABOLIC PANEL - Abnormal; Notable for the following:        Result Value   Potassium 3.2 (*)    BUN <5 (*)    All other components within normal limits  URINALYSIS, COMPLETE (UACMP) WITH MICROSCOPIC - Abnormal; Notable for the following:    Color, Urine YELLOW (*)    APPearance CLEAR (*)    Hgb urine dipstick SMALL (*)    Bacteria, UA RARE (*)    Squamous Epithelial / LPF 0-5 (*)    All other components within normal limits  CBC WITH DIFFERENTIAL/PLATELET  AMYLASE  LIPASE, BLOOD  TSH  POC URINE PREG, ED  POCT PREGNANCY, URINE   ____________________________________________  EKG   ____________________________________________  RADIOLOGY Unk Pinto, personally viewed and evaluated these images (plain radiographs) as part of my medical decision making, as well as reviewing the written report by the radiologist.    Dg Chest 2 View  Result Date: 06/22/2017 CLINICAL DATA:  Chest pain, shortness of Breath EXAM: CHEST  2 VIEW COMPARISON:  10/22/2016 FINDINGS: Heart and mediastinal contours are within normal limits. No focal opacities  or effusions. No acute bony abnormality. IMPRESSION: No active cardiopulmonary disease. Electronically Signed   By: Rolm Baptise M.D.   On: 06/22/2017 18:32    ____________________________________________    PROCEDURES  Procedure(s) performed:    Procedures    Medications  acetaminophen (TYLENOL) tablet 650 mg (650 mg Oral Given 06/22/17 1825)  sodium chloride 0.9 % bolus 1,000 mL (0 mLs Intravenous Stopped 06/22/17 1952)     ____________________________________________   INITIAL IMPRESSION / ASSESSMENT AND PLAN / ED COURSE  Pertinent labs & imaging results that were available during my care of the patient were reviewed by me and considered in my medical decision making (see chart for details).  Review of the [place] CSRS was performed in accordance of the [place] prior to dispensing any controlled drugs.     Assessment and plan Viral syndrome Patient presents to the emergency department  with congestion, nonproductive cough, nausea, vomiting and epigastric abdominal pain for the past 2 days. Workup conducted in the emergency department was reassuring. Tachycardia upon initial presentation resolved with supplemental fluids. Zofran was given in the emergency department for nausea. Tylenol was given for fever. Patient was discharged with a 2 day course of Zofran. Vital signs are reassuring prior to discharge. All patient questions were answered.  _______________________________________  FINAL CLINICAL IMPRESSION(S) / ED DIAGNOSES  Final diagnoses:  Viral syndrome      NEW MEDICATIONS STARTED DURING THIS VISIT:  New Prescriptions   ONDANSETRON (ZOFRAN) 4 MG TABLET    Take 1 tablet (4 mg total) by mouth every 8 (eight) hours as needed for nausea or vomiting.        This chart was dictated using voice recognition software/Dragon. Despite best efforts to proofread, errors can occur which can change the meaning. Any change was purely unintentional.    Karren Cobble 06/22/17 2012    Darel Hong, MD 06/22/17 2101

## 2017-07-03 ENCOUNTER — Encounter: Payer: Self-pay | Admitting: Emergency Medicine

## 2017-07-03 ENCOUNTER — Emergency Department
Admission: EM | Admit: 2017-07-03 | Discharge: 2017-07-03 | Disposition: A | Discharge status: Home / Self Care | Payer: Medicare Other | Attending: Emergency Medicine | Admitting: Emergency Medicine

## 2017-07-03 DIAGNOSIS — J45909 Unspecified asthma, uncomplicated: Secondary | ICD-10-CM | POA: Diagnosis not present

## 2017-07-03 DIAGNOSIS — G8929 Other chronic pain: Secondary | ICD-10-CM | POA: Insufficient documentation

## 2017-07-03 DIAGNOSIS — E039 Hypothyroidism, unspecified: Secondary | ICD-10-CM | POA: Diagnosis not present

## 2017-07-03 DIAGNOSIS — R1084 Generalized abdominal pain: Secondary | ICD-10-CM | POA: Insufficient documentation

## 2017-07-03 DIAGNOSIS — Z79899 Other long term (current) drug therapy: Secondary | ICD-10-CM | POA: Insufficient documentation

## 2017-07-03 DIAGNOSIS — E876 Hypokalemia: Secondary | ICD-10-CM | POA: Insufficient documentation

## 2017-07-03 DIAGNOSIS — R112 Nausea with vomiting, unspecified: Secondary | ICD-10-CM | POA: Insufficient documentation

## 2017-07-03 DIAGNOSIS — R109 Unspecified abdominal pain: Secondary | ICD-10-CM

## 2017-07-03 LAB — CBC
HCT: 37.6 % (ref 35.0–47.0)
Hemoglobin: 13.1 g/dL (ref 12.0–16.0)
MCH: 32.4 pg (ref 26.0–34.0)
MCHC: 35 g/dL (ref 32.0–36.0)
MCV: 92.8 fL (ref 80.0–100.0)
Platelets: 243 10*3/uL (ref 150–440)
RBC: 4.05 MIL/uL (ref 3.80–5.20)
RDW: 12.3 % (ref 11.5–14.5)
WBC: 6.9 10*3/uL (ref 3.6–11.0)

## 2017-07-03 LAB — COMPREHENSIVE METABOLIC PANEL
ALT: 38 U/L (ref 14–54)
AST: 42 U/L — ABNORMAL HIGH (ref 15–41)
Albumin: 4.3 g/dL (ref 3.5–5.0)
Alkaline Phosphatase: 38 U/L (ref 38–126)
Anion gap: 6 (ref 5–15)
BUN: 8 mg/dL (ref 6–20)
CO2: 26 mmol/L (ref 22–32)
Calcium: 8.7 mg/dL — ABNORMAL LOW (ref 8.9–10.3)
Chloride: 108 mmol/L (ref 101–111)
Creatinine, Ser: 0.58 mg/dL (ref 0.44–1.00)
GFR calc Af Amer: >60 mL/min (ref >60)
GFR calc non Af Amer: >60 mL/min (ref >60)
Glucose, Bld: 129 mg/dL — ABNORMAL HIGH (ref 65–99)
Potassium: 3 mmol/L — ABNORMAL LOW (ref 3.5–5.1)
Sodium: 140 mmol/L (ref 135–145)
Total Bilirubin: 1.2 mg/dL (ref 0.3–1.2)
Total Protein: 7.4 g/dL (ref 6.5–8.1)

## 2017-07-03 LAB — URINALYSIS, COMPLETE (UACMP) WITH MICROSCOPIC
Bilirubin Urine: NEGATIVE
Glucose, UA: NEGATIVE mg/dL
Ketones, ur: NEGATIVE mg/dL
Leukocytes, UA: NEGATIVE
Nitrite: NEGATIVE
Protein, ur: NEGATIVE mg/dL
Specific Gravity, Urine: 1.017 (ref 1.005–1.030)
pH: 5 (ref 5.0–8.0)

## 2017-07-03 LAB — GLUCOSE, CAPILLARY: Glucose-Capillary: 72 mg/dL (ref 65–99)

## 2017-07-03 LAB — POC URINE PREG, ED: Preg Test, Ur: NEGATIVE

## 2017-07-03 LAB — LIPASE, BLOOD: Lipase: 24 U/L (ref 11–51)

## 2017-07-03 MED ORDER — ONDANSETRON 4 MG PO TBDP
4.0000 mg | ORAL_TABLET | Freq: Once | ORAL | Status: AC | PRN
  Administered 2017-07-03: 4 mg via ORAL
  Filled 2017-07-03: qty 1

## 2017-07-03 MED ORDER — POTASSIUM CHLORIDE CRYS ER 20 MEQ PO TBCR: 20.0000 meq | EXTENDED_RELEASE_TABLET | Freq: Every day | ORAL | 0 refills | Status: DC

## 2017-07-03 MED ORDER — DICYCLOMINE HCL 10 MG PO CAPS
10.0000 mg | ORAL_CAPSULE | Freq: Once | ORAL | Status: AC
  Administered 2017-07-03: 10 mg via ORAL
  Filled 2017-07-03: qty 1

## 2017-07-03 MED ORDER — ONDANSETRON 4 MG PO TBDP: ORAL_TABLET | ORAL | 0 refills | Status: DC

## 2017-07-03 MED ORDER — POTASSIUM CHLORIDE 20 MEQ PO PACK
40.0000 meq | PACK | ORAL | Status: AC
Start: 2017-07-03 — End: 2017-07-03
  Administered 2017-07-03: 40 meq via ORAL
  Filled 2017-07-03: qty 2

## 2017-07-03 MED ORDER — DICYCLOMINE HCL 10 MG PO CAPS: 10.0000 mg | ORAL_CAPSULE | Freq: Four times a day (QID) | ORAL | 0 refills | Status: DC

## 2017-07-03 MED ORDER — ONDANSETRON 4 MG PO TBDP
4.0000 mg | ORAL_TABLET | Freq: Once | ORAL | Status: AC
  Administered 2017-07-03: 4 mg via ORAL
  Filled 2017-07-03: qty 1

## 2017-07-03 NOTE — ED Triage Notes (Addendum)
Patient brought in by ems from home. Patient with complaint of lower abdominal pain and vomiting that started Thursday. Patient states that she has vomited around 20 times today. Patient states that she has had similar pain in the past and diagnosed with a virus and also has a history of endometriosis. Per ems VSS except temperature of 100. FSBS for ems was 66 and patient was given oral glucagon

## 2017-07-03 NOTE — ED Provider Notes (Signed)
[facility] Emergency Department Provider Note  ____________________________________________   First MD Initiated Contact with Patient 07/03/17 [phone]     (approximate)  I have reviewed the triage vital signs and the nursing notes.   HISTORY  Chief Complaint Abdominal Pain and Emesis   History is limited because the patient is vague with the answers to my questions   HPI Hannah Shaw is a 21 y.o. female with a well-established historyof chronic abdominal pain who has a primary care doctor/pain management physician and pain contract in [place] but lives locally.  This is her fifth ED visit in 6 months to a [facility] facility.  She presents tonight by EMS (along with her mother, who shared the ambulance) or acute on chronic abdominal pain.  She has episodes of generalized abdominal pain although sometimes it is more localized to either side, and she states that her pain started again last night.  She states is the same as all her prior episodes of pain and is accompanied with nausea and vomiting.  She does not specifically mention diarrhea this time although she is vague and evasive with answering my questions. she states that the pain is an aching pain throughout.  Nothing in particular makes the patient's symptoms better nor worse.    She denies shortness of breath and chest pain.  she states that she still does not have any local doctors and still goes to [facility].  I ask her about the medication which she has been prescribed and she states that she ran out of all her medications yesterday, prior to the onset of her symptoms.   Past Medical History:  Diagnosis Date  . Asthma    WELL CONTROLLED  . Chronic kidney disease    H/O STONES  . Endometriosis   . GERD (gastroesophageal reflux disease)   . Headache   . Hypothyroidism   . Thyroid disease     There are no active problems to display for this patient.   Past Surgical History:  Procedure  Laterality Date  . APPENDECTOMY    . ENDOMETRIAL BIOPSY    . LAPAROSCOPY N/A 03/13/2016   Procedure: LAPAROSCOPY DIAGNOSTIC;  Surgeon: Honor Loh Ward, MD;  Location: [facility] ORS;  Service: Gynecology;  Laterality: N/A;    Prior to Admission medications   Medication Sig Start Date End Date Taking? Authorizing Provider  clonazePAM (KLONOPIN) 1 MG tablet Take 1 mg by mouth 2 (two) times daily as needed.   Yes [provider]  FLUoxetine (PROZAC) 20 MG capsule Take 1 capsule by mouth daily. 08/27/16  Yes [provider]  ibuprofen (ADVIL,MOTRIN) 600 MG tablet Take 1 tablet (600 mg total) by mouth every 6 (six) hours as needed. 12/29/16  Yes Harvest Dark, MD  ipratropium (ATROVENT HFA) 17 MCG/ACT inhaler Inhale 2 puffs into the lungs every 6 (six) hours as needed.    Yes [provider]  levothyroxine (SYNTHROID, LEVOTHROID) 25 MCG tablet Take 25 mcg by mouth daily before breakfast.    Yes [provider]  oxyCODONE-acetaminophen (PERCOCET) 7.5-325 MG tablet Take 1 tablet by mouth 2 (two) times daily. 09/24/16  Yes [provider]  cephALEXin (KEFLEX) 500 MG capsule Take 1 capsule (500 mg total) by mouth 2 (two) times daily. Patient not taking: Reported on 06/04/2017 04/03/17   Lisa Roca, MD  dicyclomine (BENTYL) 10 MG capsule Take 1 capsule (10 mg total) by mouth 4 (four) times daily. 07/03/17 07/17/17  Hinda Kehr, MD  ondansetron (ZOFRAN ODT)  4 MG disintegrating tablet Allow 1-2 tablets to dissolve in your mouth every 8 hours as needed for nausea/vomiting 07/03/17   Hinda Kehr, MD  potassium chloride SA (KLOR-CON M20) 20 MEQ tablet Take 1 tablet (20 mEq total) by mouth daily. 07/03/17   Hinda Kehr, MD    Allergies Morphine and related; Fentanyl; Betadine [povidone iodine]; and Iodine  Family History  Problem Relation Age of Onset  . Cervical cancer Mother   . Lung cancer Paternal Uncle     Social History Social History  Substance Use Topics   . Smoking status: Never Smoker  . Smokeless tobacco: Never Used  . Alcohol use No    Review of Systems review of systems is limited due to the patient's evasive responses to my questions.  However she denies chest pain and shortness of breath and endorses generalized abdominal pain, nausea, and vomiting, which she states all feel like her frequent and chronic episodes of the same. ____________________________________________   PHYSICAL EXAM:  VITAL SIGNS: ED Triage Vitals  Enc Vitals Group     BP 07/03/17 0159 104/65     Pulse Rate 07/03/17 0159 95     Resp 07/03/17 0159 19     Temp 07/03/17 0159 98.5 F (36.9 C)     Temp Source 07/03/17 0159 Oral     SpO2 07/03/17 0159 100 %     Weight 07/03/17 0154 44.5 kg (98 lb)     Height 07/03/17 0154 1.575 m (5\' 2" )     Head Circumference --      Peak Flow --      Pain Score 07/03/17 0154 10     Pain Loc --      Pain Edu? --      Excl. in [place]? --     Constitutional: Alert and oriented although she appears sleepy, though it is 7 AM and she has been in the emergency department for most of the night. disheveled, appears older than chronological age Eyes: Conjunctivae are normal. PERRL. EOMI. Head: Atraumatic. Nose: No congestion/rhinnorhea. Mouth/Throat: Mucous membranes are moist. Neck: No stridor.  No meningeal signs.   Cardiovascular: Normal rate, regular rhythm. Good peripheral circulation. Grossly normal heart sounds. Respiratory: Normal respiratory effort.  No retractions. Lungs CTAB. Gastrointestinal: Soft with mild diffuse tenderness throughout but no rebound, no guarding.  No peritonitis. Musculoskeletal: No lower extremity tenderness nor edema. No gross deformities of extremities. Neurologic:  slightly slurred speech but normal language. No gross focal neurologic deficits are appreciated.  Skin:  Skin is warm, dry and intact.  Psychiatric: Mood and affect are blunted and flat.  No warning flags to suggest  SI/HI  ____________________________________________   LABS (all labs ordered are listed, but only abnormal results are displayed)  Labs Reviewed  COMPREHENSIVE METABOLIC PANEL - Abnormal; Notable for the following:       Result Value   Potassium 3.0 (*)    Glucose, Bld 129 (*)    Calcium 8.7 (*)    AST 42 (*)    All other components within normal limits  URINALYSIS, COMPLETE (UACMP) WITH MICROSCOPIC - Abnormal; Notable for the following:    Color, Urine YELLOW (*)    APPearance HAZY (*)    Hgb urine dipstick LARGE (*)    Bacteria, UA RARE (*)    Squamous Epithelial / LPF 0-5 (*)    All other components within normal limits  LIPASE, BLOOD  CBC  GLUCOSE, CAPILLARY  POC URINE PREG, ED  CBG MONITORING, ED  ____________________________________________  EKG  None - EKG not ordered by ED physician ____________________________________________  RADIOLOGY   No results found.  ____________________________________________   PROCEDURES  Critical Care performed: No   Procedure(s) performed:   Procedures   ____________________________________________   INITIAL IMPRESSION / ASSESSMENT AND PLAN / ED COURSE  Pertinent labs & imaging results that were available during my care of the patient were reviewed by me and considered in my medical decision making (see chart for details).  the patient's labs are generally reassuring with some hypokalemia consistent with her reported recent vomiting.  She portably has not vomited since she has been in the emergency department and she arrived by EMS about 5 hours ago. She has been sleeping comfortably.  Reportedly she was initially hypoglycemic in the field although her metabolic panel was reassuring.  We will check a fingerstick glucose before discharge.   I explained to her abdominal exam is reassuring, as is the fact that her symptoms are all consistent with her prior presentations, and there are no warning flags in her vital  signs or her lab work.  Of note, she has had temperatures of about 100 degrees on at least one or two prior ED visits.  I reminded her about our chronic pain and narcotics policy.  I suggested that we treat with antiemetics and she agreed.  I will also prescribe Bentyl to help with her abdominal cramps, as well as potassium supplements to help with her hypokalemia.  Encouraged close outpatient follow up, but with existing provider in [place], and to establish local care (I provided the number for the patient navigator as well as the [facility]).    I gave my usual and customary return precautions.        ____________________________________________  FINAL CLINICAL IMPRESSION(S) / ED DIAGNOSES  Final diagnoses:  Chronic abdominal pain  Nausea and vomiting, intractability of vomiting not specified, unspecified vomiting type  Hypokalemia     MEDICATIONS GIVEN DURING THIS VISIT:  Medications  ondansetron (ZOFRAN-ODT) disintegrating tablet 4 mg (4 mg Oral Given 07/03/17 0214)  ondansetron (ZOFRAN-ODT) disintegrating tablet 4 mg (4 mg Oral Given 07/03/17 0743)  dicyclomine (BENTYL) capsule 10 mg (10 mg Oral Given 07/03/17 0743)  potassium chloride (KLOR-CON) packet 40 mEq (40 mEq Oral Given 07/03/17 0808)     NEW OUTPATIENT MEDICATIONS STARTED DURING THIS VISIT:  Discharge Medication List as of 07/03/2017  7:19 AM    START taking these medications   Details  dicyclomine (BENTYL) 10 MG capsule Take 1 capsule (10 mg total) by mouth 4 (four) times daily., Starting Sat 07/03/2017, Until Sat 07/17/2017, Print    potassium chloride SA (KLOR-CON M20) 20 MEQ tablet Take 1 tablet (20 mEq total) by mouth daily., Starting Sat 07/03/2017, Print        Discharge Medication List as of 07/03/2017  7:19 AM    CONTINUE these medications which have CHANGED   Details  ondansetron (ZOFRAN ODT) 4 MG disintegrating tablet Allow 1-2 tablets to dissolve in your mouth every 8 hours as needed for nausea/vomiting,  Print        Discharge Medication List as of 07/03/2017  7:19 AM       Note:  This document was prepared using Dragon voice recognition software and may include unintentional dictation errors.    Hinda Kehr, MD 07/03/17 [phone]

## 2017-07-03 NOTE — Discharge Instructions (Signed)
You have been seen in the Emergency Department (ED) for abdominal pain.  Your evaluation did not identify a clear cause of your symptoms but was generally reassuring.  Similar to other evaluations, your labs are essentially normal except for a slightly low potassium.  This can be the result of vomiting and diarrhea.  You told us that you ran out of your Percocet yesterday.  It is very possible that you are experiencing some symptoms of narcotics withdrawal, which including abdominal pain, nausea, vomiting, and diarrhea.  Please follow up as instructed above regarding today?s emergent visit and the symptoms that are bothering you.  As we discussed, we cannot provide narcotics for chronic pain according to the [facility] Chronic Pain policy.  Please use the prescriptions provided for nausea medication, as well as potassium supplements to help get you back to a normal level.  The prescription for Bentyl will also help with your abdominal cramping.  Return to the emergency department if you develop new or worsening symptoms that concern you.

## 2017-07-08 ENCOUNTER — Ambulatory Visit: Payer: Medicare Other | Admitting: Obstetrics & Gynecology

## 2017-07-20 ENCOUNTER — Telehealth: Payer: Self-pay

## 2017-07-20 NOTE — Telephone Encounter (Signed)
Pt needs to speak to [facility].  She is in a lot of pain.  Has been crying and screaming d/t pain.  What to do?  [phone]

## 2017-07-20 NOTE — Telephone Encounter (Signed)
Pt states she's in a lot of pain, I asked her if she wanted to make a appt, she declined, stated that  [facility] told her there was nothing else he could so for her. She Does not know what else to do. Please advise

## 2017-07-21 ENCOUNTER — Encounter (HOSPITAL_COMMUNITY): Payer: Self-pay | Admitting: *Deleted

## 2017-07-21 ENCOUNTER — Telehealth: Payer: Self-pay

## 2017-07-21 ENCOUNTER — Emergency Department (HOSPITAL_COMMUNITY)
Admission: EM | Admit: 2017-07-21 | Discharge: 2017-07-21 | Disposition: A | Discharge status: Home / Self Care | Payer: Medicare Other | Attending: Emergency Medicine | Admitting: Emergency Medicine

## 2017-07-21 DIAGNOSIS — N189 Chronic kidney disease, unspecified: Secondary | ICD-10-CM | POA: Diagnosis not present

## 2017-07-21 DIAGNOSIS — G8929 Other chronic pain: Secondary | ICD-10-CM | POA: Diagnosis not present

## 2017-07-21 DIAGNOSIS — J45909 Unspecified asthma, uncomplicated: Secondary | ICD-10-CM | POA: Diagnosis not present

## 2017-07-21 DIAGNOSIS — R1030 Lower abdominal pain, unspecified: Secondary | ICD-10-CM | POA: Diagnosis present

## 2017-07-21 DIAGNOSIS — Z79899 Other long term (current) drug therapy: Secondary | ICD-10-CM | POA: Insufficient documentation

## 2017-07-21 DIAGNOSIS — E039 Hypothyroidism, unspecified: Secondary | ICD-10-CM | POA: Diagnosis not present

## 2017-07-21 DIAGNOSIS — R112 Nausea with vomiting, unspecified: Secondary | ICD-10-CM | POA: Diagnosis not present

## 2017-07-21 DIAGNOSIS — R109 Unspecified abdominal pain: Secondary | ICD-10-CM

## 2017-07-21 LAB — URINALYSIS, ROUTINE W REFLEX MICROSCOPIC
Bilirubin Urine: NEGATIVE
Glucose, UA: NEGATIVE mg/dL
Ketones, ur: NEGATIVE mg/dL
Leukocytes, UA: NEGATIVE
Nitrite: NEGATIVE
Protein, ur: NEGATIVE mg/dL
Specific Gravity, Urine: 1.011 (ref 1.005–1.030)
pH: 6 (ref 5.0–8.0)

## 2017-07-21 LAB — COMPREHENSIVE METABOLIC PANEL
ALT: 17 U/L (ref 14–54)
AST: 19 U/L (ref 15–41)
Albumin: 4 g/dL (ref 3.5–5.0)
Alkaline Phosphatase: 36 U/L — ABNORMAL LOW (ref 38–126)
Anion gap: 7 (ref 5–15)
BUN: 6 mg/dL (ref 6–20)
CO2: 25 mmol/L (ref 22–32)
Calcium: 9.1 mg/dL (ref 8.9–10.3)
Chloride: 105 mmol/L (ref 101–111)
Creatinine, Ser: 0.58 mg/dL (ref 0.44–1.00)
GFR calc Af Amer: >60 mL/min (ref >60)
GFR calc non Af Amer: >60 mL/min (ref >60)
Glucose, Bld: 67 mg/dL (ref 65–99)
Potassium: 3.2 mmol/L — ABNORMAL LOW (ref 3.5–5.1)
Sodium: 137 mmol/L (ref 135–145)
Total Bilirubin: 0.6 mg/dL (ref 0.3–1.2)
Total Protein: 7 g/dL (ref 6.5–8.1)

## 2017-07-21 LAB — CBC WITH DIFFERENTIAL/PLATELET
Basophils Absolute: 0 10*3/uL (ref 0.0–0.1)
Basophils Relative: 0 %
Eosinophils Absolute: 0.1 10*3/uL (ref 0.0–0.7)
Eosinophils Relative: 2 %
HCT: 35.8 % — ABNORMAL LOW (ref 36.0–46.0)
Hemoglobin: 12.7 g/dL (ref 12.0–15.0)
Lymphocytes Relative: 39 %
Lymphs Abs: 2.5 10*3/uL (ref 0.7–4.0)
MCH: 32.3 pg (ref 26.0–34.0)
MCHC: 35.5 g/dL (ref 30.0–36.0)
MCV: 91.1 fL (ref 78.0–100.0)
Monocytes Absolute: 0.5 10*3/uL (ref 0.1–1.0)
Monocytes Relative: 8 %
Neutro Abs: 3.2 10*3/uL (ref 1.7–7.7)
Neutrophils Relative %: 51 %
Platelets: 207 10*3/uL (ref 150–400)
RBC: 3.93 MIL/uL (ref 3.87–5.11)
RDW: 11.5 % (ref 11.5–15.5)
WBC: 6.4 10*3/uL (ref 4.0–10.5)

## 2017-07-21 LAB — PREGNANCY, URINE: Preg Test, Ur: NEGATIVE

## 2017-07-21 MED ORDER — KETOROLAC TROMETHAMINE 30 MG/ML IJ SOLN
15.0000 mg | Freq: Once | INTRAMUSCULAR | Status: AC
  Administered 2017-07-21: 15 mg via INTRAVENOUS
  Filled 2017-07-21: qty 1

## 2017-07-21 MED ORDER — HALOPERIDOL LACTATE 5 MG/ML IJ SOLN
2.0000 mg | Freq: Once | INTRAMUSCULAR | Status: AC
  Administered 2017-07-21: 2 mg via INTRAVENOUS
  Filled 2017-07-21: qty 1

## 2017-07-21 NOTE — Telephone Encounter (Signed)
Im not sure what else to tell her, I spoke with her yesterday asked her if she wanted a appt, she declined stating [facility] told her there is nothing else he could do for her, I routed the message to [facility] but have not got a response yet.

## 2017-07-21 NOTE — ED Triage Notes (Addendum)
Pt brought in by [facility] for c/o abdominal pain with vomiting that started today; ems reports pt's cbg was 66 on the scene; pt given orange juice

## 2017-07-21 NOTE — Telephone Encounter (Signed)
Pt sees [facility]. States she is in a lot of pain. She has cried her eyes out. Message ended abruptly. Pt called after hours Nurse line & was advised to go to ER. Pt chart shows she did report to ER.

## 2017-07-21 NOTE — Telephone Encounter (Signed)
Not doing hysterectomy at age 21 so she will have to find somewhere else to do that

## 2017-07-21 NOTE — ED Provider Notes (Signed)
[facility] DEPT Provider Note   CSN: [id number] Arrival date & time: 07/21/17  0118     History   Chief Complaint Chief Complaint  Patient presents with  . Abdominal Pain    HPI Hannah Shaw is a 21 y.o. female.  HPI  This is a 21 year old female with a history of asthma, endometriosis, GERD, chronic abdominal pain who presents with abdominal pain. Patient reports one-day history of worsening stabbing lower abdominal pain. Current pain is rated 8 out of 10. She reports multiple episodes of nonbilious, nonbloody emesis. No diarrhea. She takes Percocet for pain at home. However, states that this has not helped. She presents today by EMS with her mother who also came by EMS. Patient denies any vaginal discharge concerns for STDs. Reports history of ovarian cysts. Is unsure whether she may be pregnant. Last menstrual period was 2 weeks ago.  Past Medical History:  Diagnosis Date  . Asthma    WELL CONTROLLED  . Chronic kidney disease    H/O STONES  . Endometriosis   . GERD (gastroesophageal reflux disease)   . Headache   . Hypothyroidism   . Thyroid disease     There are no active problems to display for this patient.   Past Surgical History:  Procedure Laterality Date  . APPENDECTOMY    . ENDOMETRIAL BIOPSY    . LAPAROSCOPY N/A 03/13/2016   Procedure: LAPAROSCOPY DIAGNOSTIC;  Surgeon: Honor Loh Ward, MD;  Location: [facility] ORS;  Service: Gynecology;  Laterality: N/A;    OB History    No data available       Home Medications    Prior to Admission medications   Medication Sig Start Date End Date Taking? Authorizing Provider  cephALEXin (KEFLEX) 500 MG capsule Take 1 capsule (500 mg total) by mouth 2 (two) times daily. Patient not taking: Reported on 06/04/2017 04/03/17   Lisa Roca, MD  clonazePAM (KLONOPIN) 1 MG tablet Take 1 mg by mouth 2 (two) times daily as needed.    [provider]  dicyclomine (BENTYL) 10 MG capsule Take 1 capsule (10 mg total)  by mouth 4 (four) times daily. 07/03/17 07/17/17  Hinda Kehr, MD  FLUoxetine (PROZAC) 20 MG capsule Take 1 capsule by mouth daily. 08/27/16   [provider]  ibuprofen (ADVIL,MOTRIN) 600 MG tablet Take 1 tablet (600 mg total) by mouth every 6 (six) hours as needed. 12/29/16   Harvest Dark, MD  ipratropium (ATROVENT HFA) 17 MCG/ACT inhaler Inhale 2 puffs into the lungs every 6 (six) hours as needed.     [provider]  levothyroxine (SYNTHROID, LEVOTHROID) 25 MCG tablet Take 25 mcg by mouth daily before breakfast.     [provider]  ondansetron (ZOFRAN ODT) 4 MG disintegrating tablet Allow 1-2 tablets to dissolve in your mouth every 8 hours as needed for nausea/vomiting 07/03/17   Hinda Kehr, MD  oxyCODONE-acetaminophen (PERCOCET) 7.5-325 MG tablet Take 1 tablet by mouth 2 (two) times daily. 09/24/16   [provider]  potassium chloride SA (KLOR-CON M20) 20 MEQ tablet Take 1 tablet (20 mEq total) by mouth daily. 07/03/17   Hinda Kehr, MD    Family History Family History  Problem Relation Age of Onset  . Cervical cancer Mother   . Lung cancer Paternal Uncle     Social History Social History  Substance Use Topics  . Smoking status: Never Smoker  . Smokeless tobacco: Never Used  . Alcohol use No     Allergies  Morphine and related; Fentanyl; Betadine [povidone iodine]; and Iodine   Review of Systems Review of Systems  Constitutional: Negative for fever.  Respiratory: Negative for shortness of breath.   Cardiovascular: Negative for chest pain.  Gastrointestinal: Positive for abdominal pain, nausea and vomiting. Negative for constipation and diarrhea.  Genitourinary: Negative for dysuria, vaginal discharge and vaginal pain.  All other systems reviewed and are negative.    Physical Exam Updated Vital Signs BP 117/82 (BP Location: Left Arm)   Pulse 99   Temp 97.7 F (36.5 C) (Oral)   Resp 18   Ht 5\' 2"  (1.575 m)   Wt 44.5 kg (98  lb)   LMP 07/07/2017   SpO2 100%   BMI 17.92 kg/m   Physical Exam  Constitutional: She is oriented to person, place, and time. No distress.  HENT:  Head: Normocephalic and atraumatic.  poor dentition  Eyes: Pupils are equal, round, and reactive to light.  Pupils 2 mm reactive bilaterally  Cardiovascular: Normal rate, regular rhythm and normal heart sounds.   Pulmonary/Chest: Effort normal and breath sounds normal. No respiratory distress. She has no wheezes.  Abdominal: Soft. Bowel sounds are normal. There is no tenderness. There is no rebound and no guarding.  Musculoskeletal: She exhibits no edema.  Neurological: She is alert and oriented to person, place, and time.  Skin: Skin is warm and dry.  Psychiatric: She has a normal mood and affect.  Nursing note and vitals reviewed.    ED Treatments / Results  Labs (all labs ordered are listed, but only abnormal results are displayed) Labs Reviewed  CBC WITH DIFFERENTIAL/PLATELET - Abnormal; Notable for the following:       Result Value   HCT 35.8 (*)    All other components within normal limits  COMPREHENSIVE METABOLIC PANEL - Abnormal; Notable for the following:    Potassium 3.2 (*)    Alkaline Phosphatase 36 (*)    All other components within normal limits  URINALYSIS, ROUTINE W REFLEX MICROSCOPIC - Abnormal; Notable for the following:    APPearance HAZY (*)    Hgb urine dipstick MODERATE (*)    Bacteria, UA RARE (*)    Squamous Epithelial / LPF 6-30 (*)    All other components within normal limits  PREGNANCY, URINE    EKG  EKG Interpretation None       Radiology No results found.  Procedures Procedures (including critical care time)  Medications Ordered in ED Medications  haloperidol lactate (HALDOL) injection 2 mg (2 mg Intravenous Given 07/21/17 0205)  ketorolac (TORADOL) 30 MG/ML injection 15 mg (15 mg Intravenous Given 07/21/17 0205)     Initial Impression / Assessment and Plan / ED Course  I have  reviewed the triage vital signs and the nursing notes.  Pertinent labs & imaging results that were available during my care of the patient were reviewed by me and considered in my medical decision making (see chart for details).  Clinical Course as of Jul 21 514  Wed Jul 21, 2017  [id number] Workup is negative. On recheck after Toradol and Haldol, patient is resting comfortably without complaint.  [CH]    Clinical Course User Index [CH] Elgie Landino, Barbette Hair, MD    Patient presents with abdominal pain. She presents with her mother. History of chronic abdominal pain.  Last evaluation was  regional on September 8. No reproducible tenderness on exam. She is otherwise nontoxic and vital signs are reassuring.given patient's history, will avoid narcotics. Patient was given Toradol  and Haldol for vomiting.Basic labwork is reassuring. Patient rested comfortably during her ED stay.On recheck, she states she feels somewhat better. She is able to tolerate fluids. Do not feel she needs imaging at this time.  After history, exam, and medical workup I feel the patient has been appropriately medically screened and is safe for discharge home. Pertinent diagnoses were discussed with the patient. Patient was given return precautions.   Final Clinical Impressions(s) / ED Diagnoses   Final diagnoses:  Chronic abdominal pain    New Prescriptions New Prescriptions   No medications on file     Merryl Hacker, MD 07/21/17 [phone]

## 2017-07-21 NOTE — Telephone Encounter (Signed)
Mailbox full

## 2017-07-21 NOTE — Telephone Encounter (Signed)
Surgery last year by Dr Leonides Schanz did not reveal endometriosis, so cannot treat something that is not there.

## 2017-07-21 NOTE — Telephone Encounter (Signed)
She doesn't want a hysterectomy, just wants to get medication for endometriosis,

## 2017-07-21 NOTE — Telephone Encounter (Signed)
Pt mom states she went to [facility] states the er wants Her to follow up with OBGYN care for the endometriosis, I told pts mom you said there is nothing else you can do. Pt mom would like [facility] to call her, states she had these same problems and had a hysterectomy, please advise, also states hr daughter has a knot in pelvic area.

## 2017-07-21 NOTE — Telephone Encounter (Signed)
She has been seen here in Aug, June and in ER (last night included), nothing else to do here.  See PCP.

## 2017-07-23 ENCOUNTER — Ambulatory Visit: Payer: Medicare Other | Admitting: Obstetrics & Gynecology

## 2017-07-26 ENCOUNTER — Telehealth: Payer: Self-pay

## 2017-07-26 NOTE — Telephone Encounter (Signed)
SDJ has seen pt before, can he see her.  She is hurting really really really bad in stomach.  [phone]

## 2017-07-26 NOTE — Telephone Encounter (Signed)
Pt states she is hurting real bad in stomach again.  Can PH see her?  [phone]

## 2017-07-26 NOTE — Telephone Encounter (Signed)
Not sure why this is routed to me.  Patient wanted supervisor to call her.  I have seen her in the past. But, her pain does not seem to be something I can control.

## 2017-07-26 NOTE — Telephone Encounter (Signed)
Pt states the front desk said they were going to have the supervisor call her. Inquiring when the supervisor will call her. [phone]

## 2017-08-09 NOTE — Telephone Encounter (Signed)
Can this message be completed?

## 2017-09-08 IMAGING — DX DG ANKLE COMPLETE 3+V*L*
3 series · 3 of 3 positions shown · non-contrast
Comparison: None.

CLINICAL DATA: Tripped yesterday.  Ankle pain.

EXAM:
LEFT ANKLE COMPLETE - 3+ VIEW

[ankle ap]
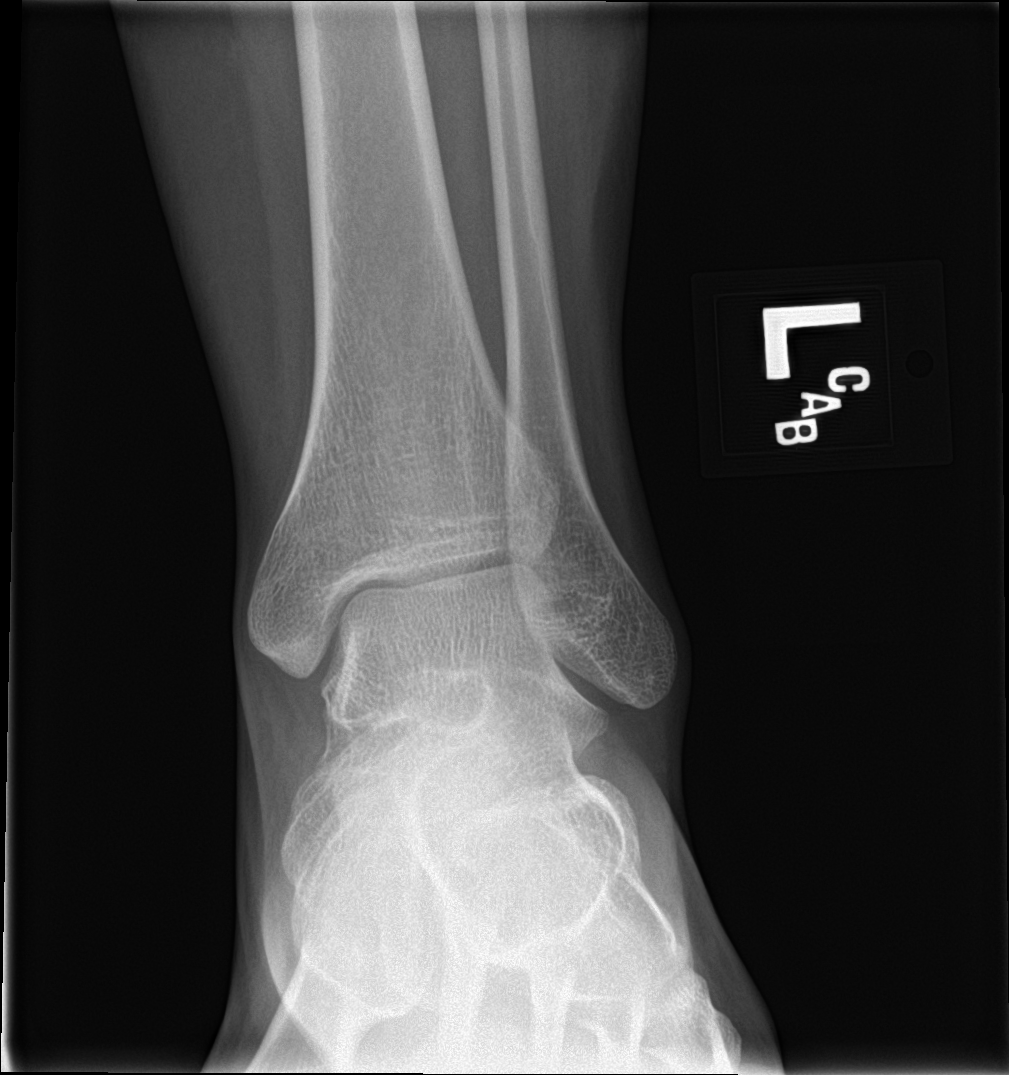

[ankle obl]
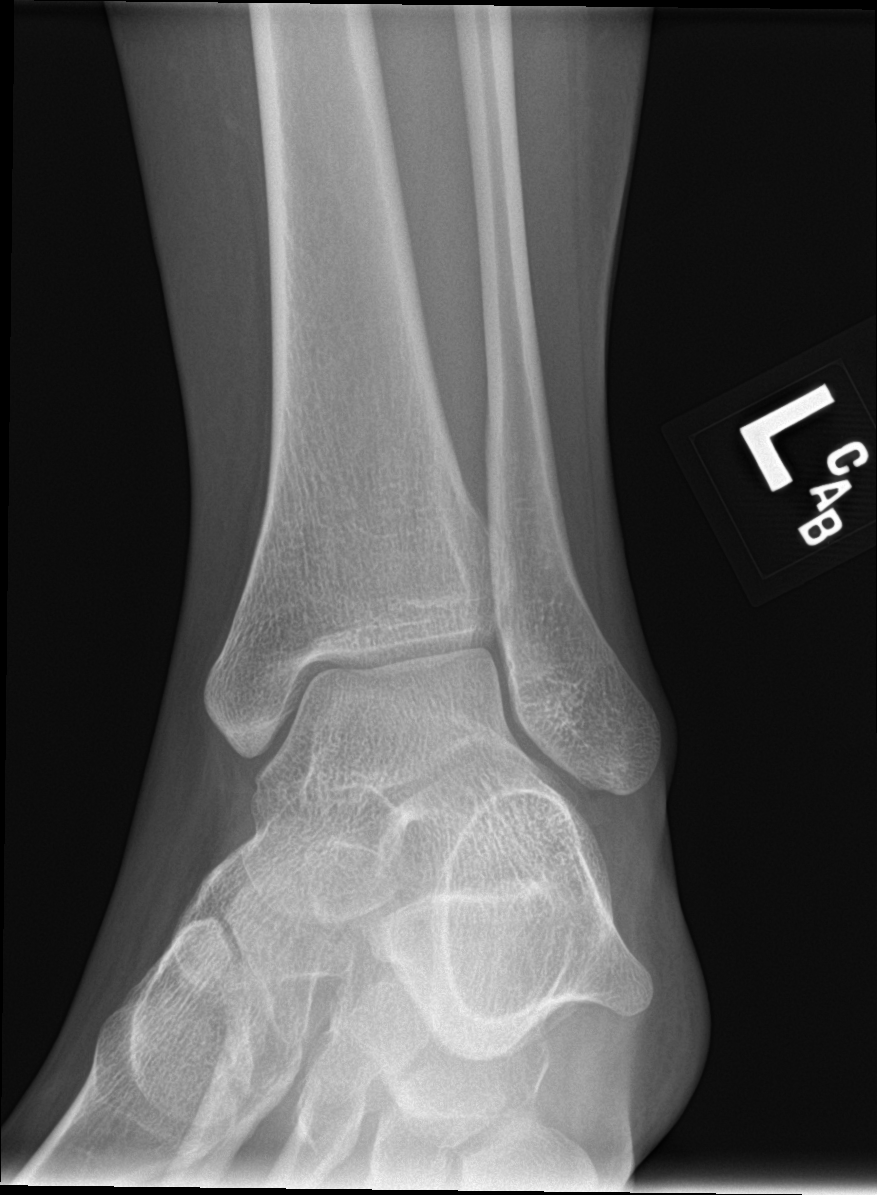

[ankle lat]
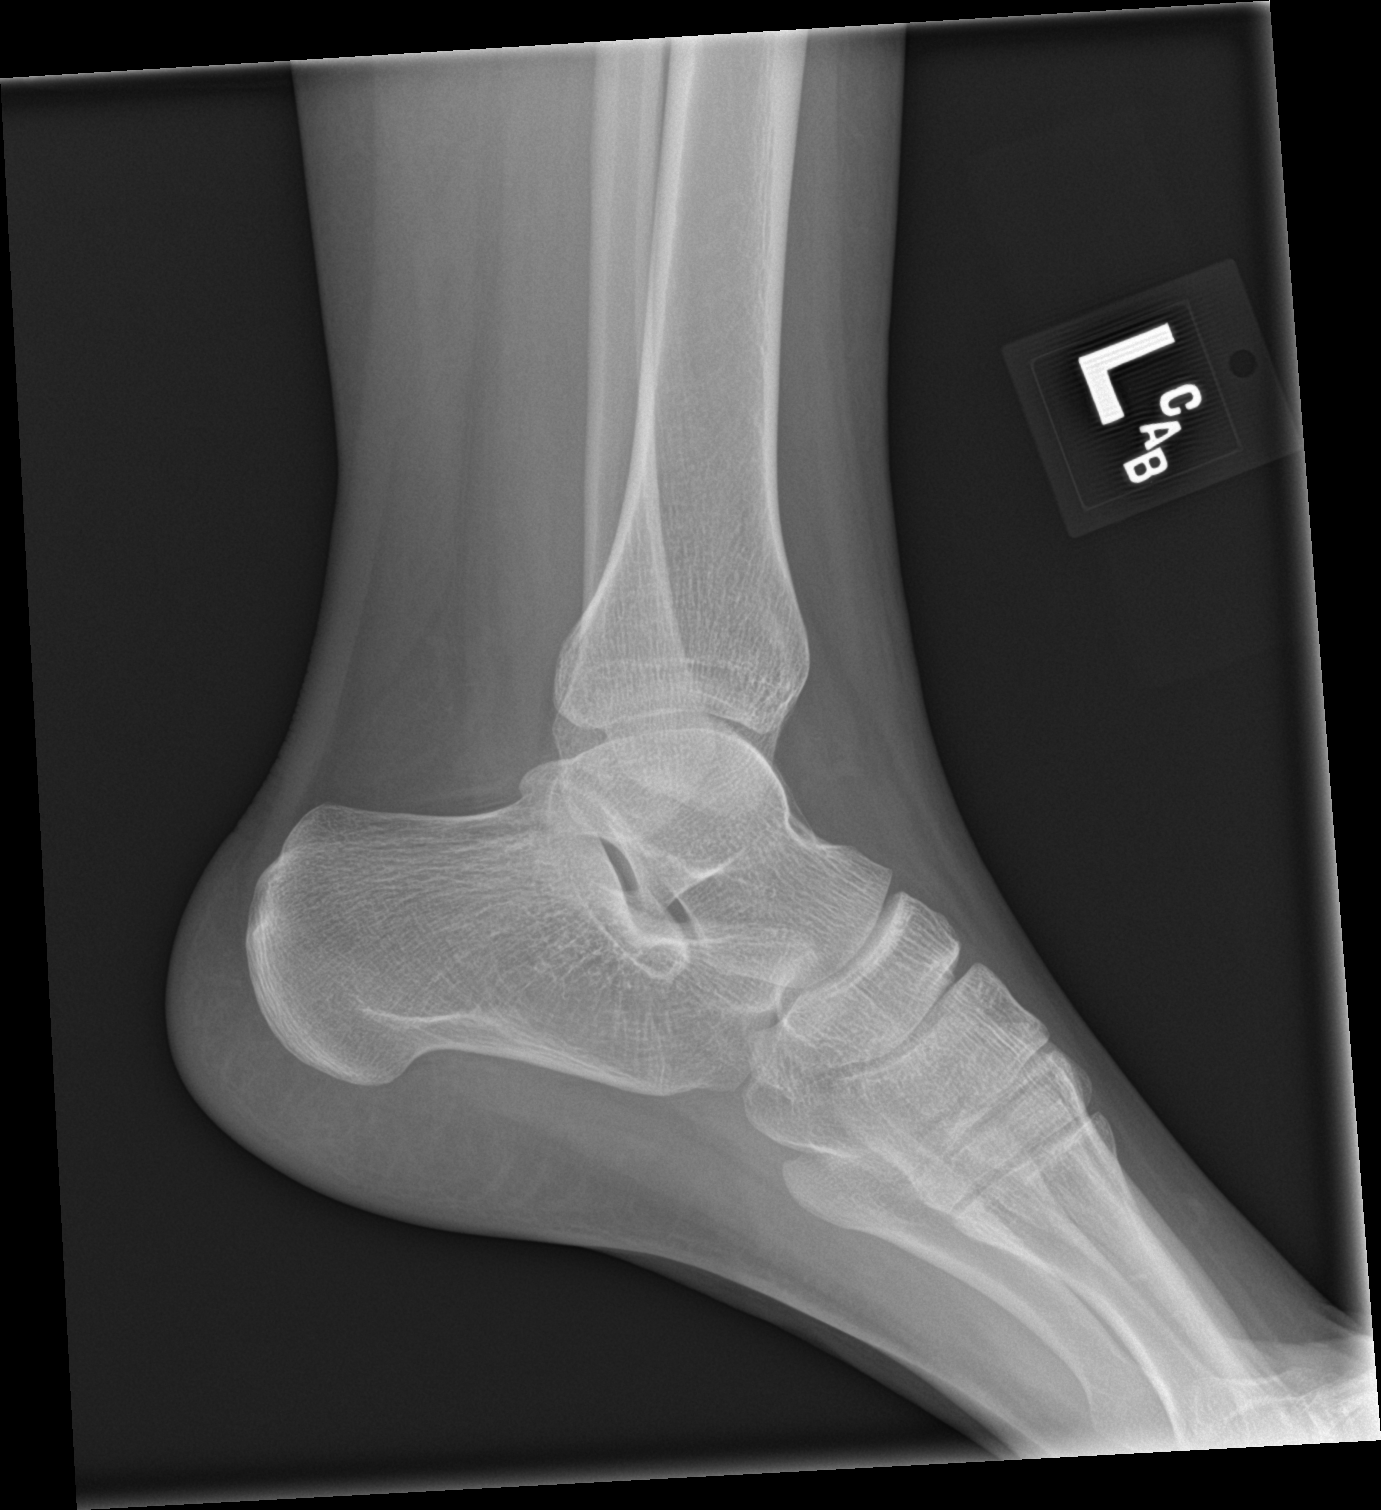

[3 of 3 positions shown; findings below may reference images not displayed]

FINDINGS: There is no evidence of fracture, dislocation, or joint effusion.
There is no evidence of arthropathy or other focal bone abnormality.
Soft tissues are unremarkable.
IMPRESSION: Negative left ankle radiographs.

## 2017-09-22 ENCOUNTER — Ambulatory Visit: Payer: Medicare Other | Admitting: Obstetrics and Gynecology

## 2017-09-27 ENCOUNTER — Encounter: Payer: Self-pay | Admitting: Certified Nurse Midwife

## 2017-10-01 ENCOUNTER — Encounter: Payer: Self-pay | Admitting: Certified Nurse Midwife

## 2017-10-06 ENCOUNTER — Encounter: Payer: Self-pay | Admitting: Certified Nurse Midwife

## 2017-10-27 ENCOUNTER — Encounter: Payer: Self-pay | Admitting: Certified Nurse Midwife

## 2017-11-03 ENCOUNTER — Encounter: Payer: Self-pay | Admitting: Obstetrics and Gynecology

## 2017-11-07 ENCOUNTER — Emergency Department (HOSPITAL_COMMUNITY)
Admission: EM | Admit: 2017-11-07 | Discharge: 2017-11-07 | Disposition: A | Discharge status: Home / Self Care | Payer: Medicare Other | Attending: Emergency Medicine | Admitting: Emergency Medicine

## 2017-11-07 ENCOUNTER — Encounter (HOSPITAL_COMMUNITY): Payer: Self-pay | Admitting: *Deleted

## 2017-11-07 ENCOUNTER — Other Ambulatory Visit: Payer: Self-pay

## 2017-11-07 DIAGNOSIS — Z885 Allergy status to narcotic agent status: Secondary | ICD-10-CM | POA: Diagnosis not present

## 2017-11-07 DIAGNOSIS — E039 Hypothyroidism, unspecified: Secondary | ICD-10-CM | POA: Diagnosis not present

## 2017-11-07 DIAGNOSIS — J45909 Unspecified asthma, uncomplicated: Secondary | ICD-10-CM | POA: Diagnosis not present

## 2017-11-07 DIAGNOSIS — G8929 Other chronic pain: Secondary | ICD-10-CM | POA: Insufficient documentation

## 2017-11-07 DIAGNOSIS — N939 Abnormal uterine and vaginal bleeding, unspecified: Secondary | ICD-10-CM | POA: Diagnosis not present

## 2017-11-07 DIAGNOSIS — N189 Chronic kidney disease, unspecified: Secondary | ICD-10-CM | POA: Insufficient documentation

## 2017-11-07 DIAGNOSIS — Z79899 Other long term (current) drug therapy: Secondary | ICD-10-CM | POA: Diagnosis not present

## 2017-11-07 DIAGNOSIS — R1030 Lower abdominal pain, unspecified: Secondary | ICD-10-CM | POA: Diagnosis not present

## 2017-11-07 DIAGNOSIS — R109 Unspecified abdominal pain: Secondary | ICD-10-CM

## 2017-11-07 LAB — CBC WITH DIFFERENTIAL/PLATELET
Basophils Absolute: 0 10*3/uL (ref 0.0–0.1)
Basophils Relative: 0 %
Eosinophils Absolute: 0.2 10*3/uL (ref 0.0–0.7)
Eosinophils Relative: 3 %
HCT: 39.4 % (ref 36.0–46.0)
Hemoglobin: 13.6 g/dL (ref 12.0–15.0)
Lymphocytes Relative: 39 %
Lymphs Abs: 3.1 10*3/uL (ref 0.7–4.0)
MCH: 32.1 pg (ref 26.0–34.0)
MCHC: 34.5 g/dL (ref 30.0–36.0)
MCV: 92.9 fL (ref 78.0–100.0)
Monocytes Absolute: 0.5 10*3/uL (ref 0.1–1.0)
Monocytes Relative: 7 %
Neutro Abs: 4 10*3/uL (ref 1.7–7.7)
Neutrophils Relative %: 51 %
Platelets: 269 10*3/uL (ref 150–400)
RBC: 4.24 MIL/uL (ref 3.87–5.11)
RDW: 11.3 % — ABNORMAL LOW (ref 11.5–15.5)
WBC: 7.9 10*3/uL (ref 4.0–10.5)

## 2017-11-07 LAB — COMPREHENSIVE METABOLIC PANEL
ALT: 17 U/L (ref 14–54)
AST: 17 U/L (ref 15–41)
Albumin: 4.2 g/dL (ref 3.5–5.0)
Alkaline Phosphatase: 50 U/L (ref 38–126)
Anion gap: 10 (ref 5–15)
BUN: 9 mg/dL (ref 6–20)
CO2: 24 mmol/L (ref 22–32)
Calcium: 9 mg/dL (ref 8.9–10.3)
Chloride: 104 mmol/L (ref 101–111)
Creatinine, Ser: 0.62 mg/dL (ref 0.44–1.00)
GFR calc Af Amer: >60 mL/min (ref >60)
GFR calc non Af Amer: >60 mL/min (ref >60)
Glucose, Bld: 97 mg/dL (ref 65–99)
Potassium: 3.5 mmol/L (ref 3.5–5.1)
Sodium: 138 mmol/L (ref 135–145)
Total Bilirubin: 0.7 mg/dL (ref 0.3–1.2)
Total Protein: 7.4 g/dL (ref 6.5–8.1)

## 2017-11-07 LAB — WET PREP, GENITAL
Clue Cells Wet Prep HPF POC: NONE SEEN
Sperm: NONE SEEN
Trich, Wet Prep: NONE SEEN
Yeast Wet Prep HPF POC: NONE SEEN

## 2017-11-07 LAB — I-STAT BETA HCG BLOOD, ED (MC, WL, AP ONLY): I-stat hCG, quantitative: <5 m[IU]/mL (ref <5)

## 2017-11-07 LAB — LIPASE, BLOOD: Lipase: 21 U/L (ref 11–51)

## 2017-11-07 MED ORDER — KETOROLAC TROMETHAMINE 30 MG/ML IJ SOLN
30.0000 mg | Freq: Once | INTRAMUSCULAR | Status: AC
  Administered 2017-11-07: 30 mg via INTRAMUSCULAR
  Filled 2017-11-07: qty 1

## 2017-11-07 NOTE — ED Triage Notes (Signed)
Pt complains of vaginal bleeding. Pt states she has had abdominal pain for 5 years. Pt is ambulatory.

## 2017-11-07 NOTE — Discharge Instructions (Signed)
You can take ibuprofen 400 mg 4 times a day for pain in addition to your pain medication. You need to see a gynecologist about your chronic pelvic pain and abnormal periods.

## 2017-11-07 NOTE — ED Provider Notes (Signed)
[facility] EMERGENCY DEPARTMENT Provider Note   CSN: [id number] Arrival date & time: 11/07/17  0048  Time seen 02:30 AM   History   Chief Complaint Chief Complaint  Patient presents with  . Vaginal Bleeding    HPI Hannah Shaw is a 22 y.o. female.  HPI patient states she has been having abnormal vaginal bleeding since she was 22 years old.  She states she does not have a gynecologist because she keeps missing her appointments.  She states she last had a period about 2 weeks ago.  This bleeding started 5 days ago.  She states this is different because it is heavier and more painful than usual.  Patient has a history of chronic abdominal pain and states this is her usual chronic pain.  She states it is knifelike and burning.  She points to the suprapubic area.  She has nausea without vomiting, she states she had about 5 episodes of diarrhea today.  She states she thinks she may have had a low-grade fever however she did not check it.  She denies running out of her chronic pain medication.  Patient denies any new sexual contacts, she states she does not use any form of birth control because she is trying to get pregnant. PCP [facility]   Past Medical History:  Diagnosis Date  . Asthma    WELL CONTROLLED  . Chronic kidney disease    H/O STONES  . Endometriosis   . GERD (gastroesophageal reflux disease)   . Headache   . Hypothyroidism   . Thyroid disease     There are no active problems to display for this patient.   Past Surgical History:  Procedure Laterality Date  . APPENDECTOMY    . ENDOMETRIAL BIOPSY    . LAPAROSCOPY N/A 03/13/2016   Procedure: LAPAROSCOPY DIAGNOSTIC;  Surgeon: Honor Loh Ward, MD;  Location: [facility] ORS;  Service: Gynecology;  Laterality: N/A;    OB History    No data available       Home Medications    Prior to Admission medications   Medication Sig Start Date End Date Taking? Authorizing Provider  cephALEXin (KEFLEX) 500  MG capsule Take 1 capsule (500 mg total) by mouth 2 (two) times daily. Patient not taking: Reported on 06/04/2017 04/03/17   Lisa Roca, MD  clonazePAM (KLONOPIN) 1 MG tablet Take 1 mg by mouth 2 (two) times daily as needed.    [provider]  dicyclomine (BENTYL) 10 MG capsule Take 1 capsule (10 mg total) by mouth 4 (four) times daily. 07/03/17 07/17/17  Hinda Kehr, MD  FLUoxetine (PROZAC) 20 MG capsule Take 1 capsule by mouth daily. 08/27/16   [provider]  ibuprofen (ADVIL,MOTRIN) 600 MG tablet Take 1 tablet (600 mg total) by mouth every 6 (six) hours as needed. 12/29/16   Harvest Dark, MD  ipratropium (ATROVENT HFA) 17 MCG/ACT inhaler Inhale 2 puffs into the lungs every 6 (six) hours as needed.     [provider]  levothyroxine (SYNTHROID, LEVOTHROID) 25 MCG tablet Take 25 mcg by mouth daily before breakfast.     [provider]  ondansetron (ZOFRAN ODT) 4 MG disintegrating tablet Allow 1-2 tablets to dissolve in your mouth every 8 hours as needed for nausea/vomiting 07/03/17   Hinda Kehr, MD  oxyCODONE-acetaminophen (PERCOCET) 7.5-325 MG tablet Take 1 tablet by mouth 2 (two) times daily. 09/24/16   [provider]  potassium chloride SA (KLOR-CON M20) 20 MEQ tablet Take  1 tablet (20 mEq total) by mouth daily. 07/03/17   Hinda Kehr, MD    Family History Family History  Problem Relation Age of Onset  . Cervical cancer Mother   . Lung cancer Paternal Uncle     Social History Social History   Tobacco Use  . Smoking status: Never Smoker  . Smokeless tobacco: Never Used  Substance Use Topics  . Alcohol use: No  . Drug use: No  On disability for a learning disorder   Allergies   Morphine and related; Fentanyl; Betadine [povidone iodine]; and Iodine   Review of Systems Review of Systems  All other systems reviewed and are negative.    Physical Exam Updated Vital Signs BP 110/73 (BP Location: Right Arm)   Pulse 88   Temp  99.7 F (37.6 C) (Oral)   Resp 18   Ht 5\' 2"  (1.575 m)   Wt 46.3 kg (102 lb)   LMP 11/02/2017   SpO2 100%   BMI 18.66 kg/m   Vital signs normal    Physical Exam  Constitutional: She is oriented to person, place, and time. She appears well-developed and well-nourished.  Non-toxic appearance. She does not appear ill. No distress.  Patient has a lisp and sometimes her speech is hard to understand  HENT:  Head: Normocephalic and atraumatic.  Right Ear: External ear normal.  Left Ear: External ear normal.  Nose: Nose normal. No mucosal edema or rhinorrhea.  Mouth/Throat: Oropharynx is clear and moist and mucous membranes are normal. No dental abscesses or uvula swelling.  Eyes: Conjunctivae and EOM are normal. Pupils are equal, round, and reactive to light.  Neck: Normal range of motion and full passive range of motion without pain. Neck supple.  Cardiovascular: Normal rate, regular rhythm and normal heart sounds. Exam reveals no gallop and no friction rub.  No murmur heard. Pulmonary/Chest: Effort normal and breath sounds normal. No respiratory distress. She has no wheezes. She has no rhonchi. She has no rales. She exhibits no tenderness and no crepitus.  Abdominal: Soft. Normal appearance and bowel sounds are normal. She exhibits no distension. There is tenderness in the suprapubic area. There is no rebound and no guarding.  Genitourinary:  Genitourinary Comments: Normal external genitalia with some blood around the vaginal opening.  There is some blood in the vaginal vault however she is not actively hemorrhaging.  Her cervix appears normal.  She is tender diffusely on exam, her uterus and adnexa are not enlarged.  Musculoskeletal: Normal range of motion. She exhibits no edema or tenderness.  Moves all extremities well.   Neurological: She is alert and oriented to person, place, and time. She has normal strength. No cranial nerve deficit.  Skin: Skin is warm, dry and intact. No rash  noted. No erythema. No pallor.  Psychiatric: She has a normal mood and affect. Her speech is normal and behavior is normal. Her mood appears not anxious.  Nursing note and vitals reviewed.    ED Treatments / Results  Labs (all labs ordered are listed, but only abnormal results are displayed) Results for orders placed or performed during the hospital encounter of 11/07/17  Wet prep, genital  Result Value Ref Range   Yeast Wet Prep HPF POC NONE SEEN NONE SEEN   Trich, Wet Prep NONE SEEN NONE SEEN   Clue Cells Wet Prep HPF POC NONE SEEN NONE SEEN   WBC, Wet Prep HPF POC RARE (A) NONE SEEN   Sperm NONE SEEN   Comprehensive metabolic  panel  Result Value Ref Range   Sodium 138 135 - 145 mmol/L   Potassium 3.5 3.5 - 5.1 mmol/L   Chloride 104 101 - 111 mmol/L   CO2 24 22 - 32 mmol/L   Glucose, Bld 97 65 - 99 mg/dL   BUN 9 6 - 20 mg/dL   Creatinine, Ser 0.62 0.44 - 1.00 mg/dL   Calcium 9.0 8.9 - 10.3 mg/dL   Total Protein 7.4 6.5 - 8.1 g/dL   Albumin 4.2 3.5 - 5.0 g/dL   AST 17 15 - 41 U/L   ALT 17 14 - 54 U/L   Alkaline Phosphatase 50 38 - 126 U/L   Total Bilirubin 0.7 0.3 - 1.2 mg/dL   GFR calc non Af Amer >60 >60 mL/min   GFR calc Af Amer >60 >60 mL/min   Anion gap 10 5 - 15  CBC with Differential  Result Value Ref Range   WBC 7.9 4.0 - 10.5 K/uL   RBC 4.24 3.87 - 5.11 MIL/uL   Hemoglobin 13.6 12.0 - 15.0 g/dL   HCT 39.4 36.0 - 46.0 %   MCV 92.9 78.0 - 100.0 fL   MCH 32.1 26.0 - 34.0 pg   MCHC 34.5 30.0 - 36.0 g/dL   RDW 11.3 (L) 11.5 - 15.5 %   Platelets 269 150 - 400 K/uL   Neutrophils Relative % 51 %   Neutro Abs 4.0 1.7 - 7.7 K/uL   Lymphocytes Relative 39 %   Lymphs Abs 3.1 0.7 - 4.0 K/uL   Monocytes Relative 7 %   Monocytes Absolute 0.5 0.1 - 1.0 K/uL   Eosinophils Relative 3 %   Eosinophils Absolute 0.2 0.0 - 0.7 K/uL   Basophils Relative 0 %   Basophils Absolute 0.0 0.0 - 0.1 K/uL  Lipase, blood  Result Value Ref Range   Lipase 21 11 - 51 U/L  I-Stat beta  hCG blood, ED  Result Value Ref Range   I-stat hCG, quantitative <5.0 <5 mIU/mL   Comment 3           Laboratory interpretation all normal     EKG  EKG Interpretation None       Radiology No results found.  Procedures Procedures (including critical care time)  Medications Ordered in ED Medications  ketorolac (TORADOL) 30 MG/ML injection 30 mg (30 mg Intramuscular Given 11/07/17 0252)     Initial Impression / Assessment and Plan / ED Course  I have reviewed the triage vital signs and the nursing notes.  Pertinent labs & imaging results that were available during my care of the patient were reviewed by me and considered in my medical decision making (see chart for details).     Review of patient's prior studies shows she has had multiple pelvic ultrasounds that have been normal and CT of the abdomen/pelvis that were normal.  When I went back to do the patient's pelvic exam at 4 AM she was sleeping.  However when she was awakened she said her pain was not improved.  I discussed that I could not give her anything else for pain.  Although I asked her before she ran out of her pain medicine she said no however I asked her again and she said yes and then she said no.  Patient is going to be discharged home.  She states this pain is her chronic pain.  Review of the [place] shows patient gets #60 oxycodone 7.5/325 tablets monthly, last filled on January  10 and #60 clonazepam 1 mg tablets refilled monthly last filled January 10.  These are all filled by her doctor in [facility].  Final Clinical Impressions(s) / ED Diagnoses   Final diagnoses:  Abnormal vaginal bleeding  Chronic abdominal pain    ED Discharge Orders    None    OTC ibuprofen   Plan discharge  Rolland Porter, MD, Barbette Or, MD 11/07/17 [id number]

## 2017-11-07 NOTE — ED Triage Notes (Signed)
Pt c/o vaginal bleeding and abdominal pain

## 2017-11-08 LAB — GC/CHLAMYDIA PROBE AMP (~~LOC~~) NOT AT ARMC
Chlamydia: NEGATIVE
Neisseria Gonorrhea: NEGATIVE

## 2017-11-09 ENCOUNTER — Encounter: Payer: Medicaid Other | Admitting: Obstetrics and Gynecology

## 2017-11-17 ENCOUNTER — Encounter: Payer: Self-pay | Admitting: Obstetrics and Gynecology

## 2017-11-17 ENCOUNTER — Ambulatory Visit (INDEPENDENT_AMBULATORY_CARE_PROVIDER_SITE_OTHER): Payer: Medicare Other | Admitting: Obstetrics and Gynecology

## 2017-11-17 VITALS — BP 103/69 | HR 94 | Ht 62.0 in | Wt 98.4 lb

## 2017-11-17 DIAGNOSIS — N809 Endometriosis, unspecified: Secondary | ICD-10-CM

## 2017-11-17 DIAGNOSIS — Z30011 Encounter for initial prescription of contraceptive pills: Secondary | ICD-10-CM

## 2017-11-17 DIAGNOSIS — N938 Other specified abnormal uterine and vaginal bleeding: Secondary | ICD-10-CM | POA: Diagnosis not present

## 2017-11-17 MED ORDER — DESOGESTREL-ETHINYL ESTRADIOL 0.15-0.02/0.01 MG (21/5) PO TABS: 1.0000 | ORAL_TABLET | Freq: Every day | ORAL | 2 refills | Status: DC

## 2017-11-17 NOTE — Progress Notes (Signed)
HPI:      [name] is a 22 y.o. G0P0000 who LMP was Patient's last menstrual period was 11/02/2017.  Subjective:   She presents today with complaint of approximately 2 menstrual periods per month each lasting about a week.  She also states that she has pelvic pain intermittently.  She describes a history of endometriosis as diagnosed by laparoscopy.  She is currently sexually active and not using birth control.  She has been seen in the emergency department for this problem recently.     Hx: The following portions of the patient's history were reviewed and updated as appropriate:             She  has a past medical history of Asthma, Chronic kidney disease, Endometriosis, GERD (gastroesophageal reflux disease), Headache, Hypothyroidism, and Thyroid disease. She does not have any pertinent problems on file. She  has a past surgical history that includes Appendectomy; Endometrial biopsy; and laparoscopy (N/A, 03/13/2016). Her family history includes Cervical cancer in her mother; Lung cancer in her paternal uncle. She  reports that  has never smoked. she has never used smokeless tobacco. She reports that she does not drink alcohol or use drugs. She is allergic to morphine and related; fentanyl; betadine [povidone iodine]; and iodine.       Review of Systems:  Review of Systems  Constitutional: Denied constitutional symptoms, night sweats, recent illness, fatigue, fever, insomnia and weight loss.  Eyes: Denied eye symptoms, eye pain, photophobia, vision change and visual disturbance.  Ears/Nose/Throat/Neck: Denied ear, nose, throat or neck symptoms, hearing loss, nasal discharge, sinus congestion and sore throat.  Cardiovascular: Denied cardiovascular symptoms, arrhythmia, chest pain/pressure, edema, exercise intolerance, orthopnea and palpitations.  Respiratory: Denied pulmonary symptoms, asthma, pleuritic pain, productive sputum, cough, dyspnea and wheezing.  Gastrointestinal:  Denied, gastro-esophageal reflux, melena, nausea and vomiting.  Genitourinary: See HPI for additional information.  Musculoskeletal: Denied musculoskeletal symptoms, stiffness, swelling, muscle weakness and myalgia.  Dermatologic: Denied dermatology symptoms, rash and scar.  Neurologic: Denied neurology symptoms, dizziness, headache, neck pain and syncope.  Psychiatric: Denied psychiatric symptoms, anxiety and depression.  Endocrine: Denied endocrine symptoms including hot flashes and night sweats.   Meds:   Current Outpatient Medications on File Prior to Visit  Medication Sig Dispense Refill  . clonazePAM (KLONOPIN) 1 MG tablet Take 1 mg by mouth 2 (two) times daily as needed.    Marland Kitchen FLUoxetine (PROZAC) 20 MG capsule Take 1 capsule by mouth daily.    Marland Kitchen ibuprofen (ADVIL,MOTRIN) 600 MG tablet Take 1 tablet (600 mg total) by mouth every 6 (six) hours as needed. 30 tablet 0  . ipratropium (ATROVENT HFA) 17 MCG/ACT inhaler Inhale 2 puffs into the lungs every 6 (six) hours as needed.     Marland Kitchen levothyroxine (SYNTHROID, LEVOTHROID) 25 MCG tablet Take 25 mcg by mouth daily before breakfast.     . ondansetron (ZOFRAN ODT) 4 MG disintegrating tablet Allow 1-2 tablets to dissolve in your mouth every 8 hours as needed for nausea/vomiting 30 tablet 0  . oxyCODONE-acetaminophen (PERCOCET) 7.5-325 MG tablet Take 1 tablet by mouth 2 (two) times daily.    . cephALEXin (KEFLEX) 500 MG capsule Take 1 capsule (500 mg total) by mouth 2 (two) times daily. (Patient not taking: Reported on 06/04/2017) 14 capsule 0  . dicyclomine (BENTYL) 10 MG capsule Take 1 capsule (10 mg total) by mouth 4 (four) times daily. 56 capsule 0  . potassium chloride SA (KLOR-CON M20) 20 MEQ tablet Take 1 tablet (  20 mEq total) by mouth daily. (Patient not taking: Reported on 11/17/2017) 7 tablet 0   No current facility-administered medications on file prior to visit.     Objective:     Vitals:   11/17/17 1452  BP: 103/69  Pulse: 94                 Assessment:    G0P0000 There are no active problems to display for this patient.    1. DUB (dysfunctional uterine bleeding)   2. Endometriosis determined by laparoscopy   3. Initiation of OCP (BCP)     The patient likely has endometriosis and cycle as well as pain complications from this.  We have discussed multiple options but have agreed upon OCPs to try to manage her cycles and give her cycle control as well as some pain relief from the endometriosis.  We have also discussed the possibility of extending her menstrual period so that she would have a period every 3 months on OCPs.   Plan:            1.  OCPs The risks /benefits of OCPs have been explained to the patient in detail.  Product literature has been given to her.  I have instructed her in the use of OCPs and have given her literature reinforcing this information.  I have explained to the patient that OCPs are not as effective for birth control during the first month of use, and that another form of contraception should be used during this time.  Both first-day start and Sunday start have been explained.  The risks and benefits of each was discussed.  She has been made aware of  the fact that other medications may affect the efficacy of OCPs.  I have answered all of her questions, and I believe that she has an understanding of the effectiveness and use of OCPs. Will start Mircette with next menstrual period. Orders No orders of the defined types were placed in this encounter.    Meds ordered this encounter  Medications  . desogestrel-ethinyl estradiol (MIRCETTE) 0.15-0.02/0.01 MG (21/5) tablet    Sig: Take 1 tablet by mouth at bedtime.    Dispense:  1 Package    Refill:  2      F/U  Return in about 4 months (around 03/17/2018). I spent 32 minutes with this patient of which greater than 50% was spent discussing her history of endometriosis, her cyclic pain and irregular bleeding, multiple treatment options,  effect on fertility etc.  All questions answered.  Finis Bud, M.D. 11/17/2017 3:45 PM

## 2018-01-05 ENCOUNTER — Ambulatory Visit: Payer: Medicare Other | Admitting: Obstetrics and Gynecology

## 2018-01-12 DIAGNOSIS — F819 Developmental disorder of scholastic skills, unspecified: Secondary | ICD-10-CM | POA: Insufficient documentation

## 2018-01-12 DIAGNOSIS — F329 Major depressive disorder, single episode, unspecified: Secondary | ICD-10-CM | POA: Insufficient documentation

## 2018-01-12 DIAGNOSIS — F32A Depression, unspecified: Secondary | ICD-10-CM | POA: Insufficient documentation

## 2018-01-25 ENCOUNTER — Ambulatory Visit: Payer: Medicare Other | Admitting: Obstetrics and Gynecology

## 2018-01-29 ENCOUNTER — Emergency Department: Payer: Medicare Other

## 2018-01-29 ENCOUNTER — Emergency Department
Admission: EM | Admit: 2018-01-29 | Discharge: 2018-01-30 | Disposition: A | Discharge status: Home / Self Care | Payer: Medicare Other | Attending: Emergency Medicine | Admitting: Emergency Medicine

## 2018-01-29 ENCOUNTER — Other Ambulatory Visit: Payer: Self-pay

## 2018-01-29 DIAGNOSIS — E039 Hypothyroidism, unspecified: Secondary | ICD-10-CM | POA: Insufficient documentation

## 2018-01-29 DIAGNOSIS — N939 Abnormal uterine and vaginal bleeding, unspecified: Secondary | ICD-10-CM | POA: Diagnosis not present

## 2018-01-29 DIAGNOSIS — J45909 Unspecified asthma, uncomplicated: Secondary | ICD-10-CM | POA: Diagnosis not present

## 2018-01-29 DIAGNOSIS — Z79899 Other long term (current) drug therapy: Secondary | ICD-10-CM | POA: Insufficient documentation

## 2018-01-29 DIAGNOSIS — N189 Chronic kidney disease, unspecified: Secondary | ICD-10-CM | POA: Diagnosis not present

## 2018-01-29 DIAGNOSIS — R102 Pelvic and perineal pain: Secondary | ICD-10-CM | POA: Diagnosis present

## 2018-01-29 DIAGNOSIS — O039 Complete or unspecified spontaneous abortion without complication: Secondary | ICD-10-CM

## 2018-01-29 MED ORDER — OLANZAPINE 10 MG PO TABS
20.0000 mg | ORAL_TABLET | Freq: Every day | ORAL | Status: DC
  Administered 2018-01-29: 20 mg via ORAL
  Filled 2018-01-29: qty 2

## 2018-01-29 MED ORDER — PANTOPRAZOLE SODIUM 40 MG PO TBEC
40.0000 mg | DELAYED_RELEASE_TABLET | Freq: Every day | ORAL | Status: DC
  Administered 2018-01-29: 40 mg via ORAL
  Filled 2018-01-29: qty 1
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
## 2018-01-29 NOTE — ED Notes (Signed)
Patient with reported miscarriage. States she is having periumbilical pain that is present all time and rates it 10/10. Patient remembers having a normal menstrual period two months ago. Patient with history of endometriosis and has pain most days and heavy vaginal bleeding most days. MD in to evaluate patient at time of arrival to room 3. States she has thrown up "50 times" since last night. States she is currently having vaginal bleeding at present.

## 2018-01-29 NOTE — Discharge Instructions (Addendum)
Your ultrasound is normal today and does not show any retained products of conception in the uterus.

## 2018-01-29 NOTE — ED Triage Notes (Signed)
Patient to ED for reported miscarriage. Last night passed tissue that was described as a fetus "with cord hanging off of it" but patient flushed it. Patient with some sort of mental disability.

## 2018-01-30 NOTE — ED Notes (Signed)
Patient's guardian called and told of discharge and updated on patient status.  Patient will be dc to lobby for family to pick up.

## 2018-02-03 ENCOUNTER — Ambulatory Visit: Payer: Medicare Other | Admitting: Obstetrics and Gynecology

## 2018-02-14 ENCOUNTER — Encounter: Payer: Self-pay | Admitting: Obstetrics and Gynecology

## 2018-02-14 ENCOUNTER — Ambulatory Visit (INDEPENDENT_AMBULATORY_CARE_PROVIDER_SITE_OTHER): Payer: Medicare Other | Admitting: Obstetrics and Gynecology

## 2018-02-14 ENCOUNTER — Ambulatory Visit: Payer: Medicare Other | Admitting: Obstetrics and Gynecology

## 2018-02-14 VITALS — BP 97/64 | HR 84 | Ht 62.0 in | Wt 99.9 lb

## 2018-02-14 DIAGNOSIS — Z3009 Encounter for other general counseling and advice on contraception: Secondary | ICD-10-CM | POA: Diagnosis not present

## 2018-02-14 DIAGNOSIS — Z30013 Encounter for initial prescription of injectable contraceptive: Secondary | ICD-10-CM

## 2018-02-14 MED ORDER — MEDROXYPROGESTERONE ACETATE 150 MG/ML IM SUSP: 150.0000 mg | INTRAMUSCULAR | 2 refills | Status: DC

## 2018-02-14 NOTE — Progress Notes (Signed)
HPI:      [name] is a 22 y.o. G0P0000 who LMP was Patient's last menstrual period was 11/26/2017 (lmp unknown).  Subjective:   She presents today stating that she had a miscarriage in the emergency department 2 weeks ago.  She reports she is not currently bleeding.  At the time of her emergency department visit she had a negative pregnancy test.  She says she passed a baby at home that was "6 inches long" and went to the emergency room. She had been taking OCPs intermittently?1? At this time she desires Depo-Provera for birth control.    Hx: The following portions of the patient's history were reviewed and updated as appropriate:             She  has a past medical history of Asthma, Chronic kidney disease, Endometriosis, GERD (gastroesophageal reflux disease), Headache, Hypothyroidism, and Thyroid disease. She does not have any pertinent problems on file. She  has a past surgical history that includes Appendectomy; Endometrial biopsy; and laparoscopy (N/A, 03/13/2016). Her family history includes Cervical cancer in her mother; Lung cancer in her paternal uncle. She  reports that she has been smoking cigarettes.  She has been smoking about 0.25 packs per day. She has never used smokeless tobacco. She reports that she does not drink alcohol or use drugs. She has a current medication list which includes the following prescription(s): clonazepam, fluoxetine, ibuprofen, levothyroxine, and oxycodone-acetaminophen. She is allergic to morphine and related; fentanyl; betadine [povidone iodine]; and iodine.       Review of Systems:  Review of Systems  Constitutional: Denied constitutional symptoms, night sweats, recent illness, fatigue, fever, insomnia and weight loss.  Eyes: Denied eye symptoms, eye pain, photophobia, vision change and visual disturbance.  Ears/Nose/Throat/Neck: Denied ear, nose, throat or neck symptoms, hearing loss, nasal discharge, sinus congestion and sore throat.   Cardiovascular: Denied cardiovascular symptoms, arrhythmia, chest pain/pressure, edema, exercise intolerance, orthopnea and palpitations.  Respiratory: Denied pulmonary symptoms, asthma, pleuritic pain, productive sputum, cough, dyspnea and wheezing.  Gastrointestinal: Denied, gastro-esophageal reflux, melena, nausea and vomiting.  Genitourinary: Denied genitourinary symptoms including symptomatic vaginal discharge, pelvic relaxation issues, and urinary complaints.  Musculoskeletal: Denied musculoskeletal symptoms, stiffness, swelling, muscle weakness and myalgia.  Dermatologic: Denied dermatology symptoms, rash and scar.  Neurologic: Denied neurology symptoms, dizziness, headache, neck pain and syncope.  Psychiatric: Denied psychiatric symptoms, anxiety and depression.  Endocrine: Denied endocrine symptoms including hot flashes and night sweats.   Meds:   Current Outpatient Medications on File Prior to Visit  Medication Sig Dispense Refill  . clonazePAM (KLONOPIN) 1 MG tablet Take 1 mg by mouth 2 (two) times daily as needed.    Marland Kitchen FLUoxetine (PROZAC) 20 MG capsule Take 1 capsule by mouth daily.    Marland Kitchen ibuprofen (ADVIL,MOTRIN) 600 MG tablet Take 1 tablet (600 mg total) by mouth every 6 (six) hours as needed. 30 tablet 0  . levothyroxine (SYNTHROID, LEVOTHROID) 25 MCG tablet Take 25 mcg by mouth daily before breakfast.     . oxyCODONE-acetaminophen (PERCOCET) 7.5-325 MG tablet Take 1 tablet by mouth 2 (two) times daily.     No current facility-administered medications on file prior to visit.     Objective:     Vitals:   02/14/18 0836  BP: 97/64  Pulse: 84              Physical examination   Pelvic:   Vulva: Normal appearance.  No lesions.  Vagina: No lesions or abnormalities noted.  Support: Normal pelvic support.  Urethra No masses tenderness or scarring.  Meatus Normal size without lesions or prolapse.  Cervix: Normal appearance.  No lesions.  Anus: Normal exam.  No lesions.   Perineum: Normal exam.  No lesions.        Bimanual   Uterus: Normal size.  Non-tender.  Mobile.  AV.  Adnexae: No masses.  Non-tender to palpation.  Cul-de-sac: Negative for abnormality.     Assessment:    G0P0000 There are no active problems to display for this patient.    1. Birth control counseling   2. Initiation of Depo Provera     Strongly doubt patient had a pregnancy loss.  Desires Depo-Provera for birth control   Plan:            1.  Depo-Provera prescribed.  Risk benefits reviewed. Orders No orders of the defined types were placed in this encounter.   No orders of the defined types were placed in this encounter.     F/U  No follow-ups on file.  Finis Bud, M.D. 02/14/2018 9:16 AM

## 2018-02-15 ENCOUNTER — Encounter: Payer: Medicare Other | Admitting: Obstetrics and Gynecology

## 2018-06-17 ENCOUNTER — Ambulatory Visit (INDEPENDENT_AMBULATORY_CARE_PROVIDER_SITE_OTHER): Payer: Medicare Other | Admitting: Obstetrics and Gynecology

## 2018-06-17 ENCOUNTER — Encounter: Payer: Self-pay | Admitting: Obstetrics and Gynecology

## 2018-06-17 VITALS — BP 103/70 | HR 81 | Ht 62.0 in | Wt 97.0 lb

## 2018-06-17 DIAGNOSIS — N809 Endometriosis, unspecified: Secondary | ICD-10-CM | POA: Diagnosis not present

## 2018-06-17 DIAGNOSIS — R102 Pelvic and perineal pain: Secondary | ICD-10-CM

## 2018-06-17 DIAGNOSIS — N926 Irregular menstruation, unspecified: Secondary | ICD-10-CM | POA: Diagnosis not present

## 2018-06-17 DIAGNOSIS — Z30011 Encounter for initial prescription of contraceptive pills: Secondary | ICD-10-CM | POA: Diagnosis not present

## 2018-06-17 LAB — POCT URINE PREGNANCY: Preg Test, Ur: NEGATIVE

## 2018-06-17 MED ORDER — DESOGESTREL-ETHINYL ESTRADIOL 0.15-0.02/0.01 MG (21/5) PO TABS: 1.0000 | ORAL_TABLET | Freq: Every day | ORAL | 2 refills | Status: DC

## 2018-06-17 NOTE — Progress Notes (Signed)
Pt stated that she has abd pain with swelling that has increased. Pt stated having pain since she was 22 years old. . Pt stated that her cycle should had started May 30, 2018, but it has not. LMP 04-29-18.  Pt stated that she is having unprotected sex with her partner. UPT-negative.

## 2018-06-17 NOTE — Progress Notes (Addendum)
HPI:      Ms. ADDI PAK is a 22 y.o. G1P0010 who LMP was Patient's last menstrual period was 04/29/2018.  Subjective:   She presents today to discuss her pelvic pain.  States that she has had pelvic pain "since the age of 73".  When she was 53 she underwent laparoscopy for pelvic pain and they discovered endometriosis.  She states that her pain never got better after that she has not taken OCPs or any other medications with the exception of pain medication for endometriosis. She describes regular cycles monthly with the exception of this month when she has missed her menstrual period.  She states that she is having unprotected intercourse but does not want to become pregnant.  (Her UPT is negative today)    Hx: The following portions of the patient's history were reviewed and updated as appropriate:             She  has a past medical history of Asthma, Chronic kidney disease, Endometriosis, GERD (gastroesophageal reflux disease), Headache, Hypothyroidism, and Thyroid disease. She does not have any pertinent problems on file. She  has a past surgical history that includes Appendectomy; Endometrial biopsy; and laparoscopy (N/A, 03/13/2016). Her family history includes Cervical cancer in her mother; Lung cancer in her paternal uncle. She  reports that she has been smoking cigarettes. She has been smoking about 0.25 packs per day. She has never used smokeless tobacco. She reports that she does not drink alcohol or use drugs. She has a current medication list which includes the following prescription(s): clonazepam, fluoxetine, ibuprofen, levothyroxine, oxycodone-acetaminophen, and desogestrel-ethinyl estradiol. She is allergic to morphine and related; fentanyl; betadine [povidone iodine]; and iodine.       Review of Systems:  Review of Systems  Constitutional: Denied constitutional symptoms, night sweats, recent illness, fatigue, fever, insomnia and weight loss.  Eyes: Denied eye symptoms, eye  pain, photophobia, vision change and visual disturbance.  Ears/Nose/Throat/Neck: Denied ear, nose, throat or neck symptoms, hearing loss, nasal discharge, sinus congestion and sore throat.  Cardiovascular: Denied cardiovascular symptoms, arrhythmia, chest pain/pressure, edema, exercise intolerance, orthopnea and palpitations.  Respiratory: Denied pulmonary symptoms, asthma, pleuritic pain, productive sputum, cough, dyspnea and wheezing.  Gastrointestinal: Denied, gastro-esophageal reflux, melena, nausea and vomiting.  Genitourinary: See HPI for additional information.  Musculoskeletal: Denied musculoskeletal symptoms, stiffness, swelling, muscle weakness and myalgia.  Dermatologic: Denied dermatology symptoms, rash and scar.  Neurologic: Denied neurology symptoms, dizziness, headache, neck pain and syncope.  Psychiatric: Denied psychiatric symptoms, anxiety and depression.  Endocrine: Denied endocrine symptoms including hot flashes and night sweats.   Meds:   Current Outpatient Medications on File Prior to Visit  Medication Sig Dispense Refill  . clonazePAM (KLONOPIN) 1 MG tablet Take 1 mg by mouth 2 (two) times daily as needed.    Marland Kitchen FLUoxetine (PROZAC) 20 MG capsule Take 1 capsule by mouth daily.    Marland Kitchen ibuprofen (ADVIL,MOTRIN) 600 MG tablet Take 1 tablet (600 mg total) by mouth every 6 (six) hours as needed. 30 tablet 0  . levothyroxine (SYNTHROID, LEVOTHROID) 25 MCG tablet Take 25 mcg by mouth daily before breakfast.     . oxyCODONE-acetaminophen (PERCOCET) 7.5-325 MG tablet Take 1 tablet by mouth 2 (two) times daily.     No current facility-administered medications on file prior to visit.     Objective:     Vitals:   06/17/18 1106  BP: 103/70  Pulse: 81  I have systematically reviewed her ultrasounds and previous notes regarding pelvic pain that are found in her epic records.  Assessment:    G1P0010 There are no active problems to display for this patient.     1. Missed menses   2. Endometriosis determined by laparoscopy   3. Pelvic pain in female   4. Initiation of OCP (BCP)        Plan:            1.  We discussed multiple options for endometriosis.  The plan made involves OCPs for 6 months.  If patient no better consider IUD for birth control for 3 months-this may also give her some endometriosis relief.  If this fails then consider Edward Qualia with the IUD in place for birth control.  OCPs The risks /benefits of OCPs have been explained to the patient in detail.  Product literature has been given to her.  I have instructed her in the use of OCPs and have given her literature reinforcing this information.  I have explained to the patient that OCPs are not as effective for birth control during the first month of use, and that another form of contraception should be used during this time.  Both first-day start and Sunday start have been explained.  The risks and benefits of each was discussed.  She has been made aware of  the fact that other medications may affect the efficacy of OCPs.  I have answered all of her questions, and I believe that she has an understanding of the effectiveness and use of OCPs. Patient advised not to start OCPs until her next menstrual period begins.  If she does not have a menses in the next 3 weeks repeat UPT. Orders Orders Placed This Encounter  Procedures  . POCT urine pregnancy     Meds ordered this encounter  Medications  . desogestrel-ethinyl estradiol (MIRCETTE) 0.15-0.02/0.01 MG (21/5) tablet    Sig: Take 1 tablet by mouth at bedtime.    Dispense:  1 Package    Refill:  2      F/U  Return in about 6 months (around 12/18/2018). I spent 22 minutes involved in the care of this patient of which greater than 50% was spent discussing her pelvic pain, history of endometriosis, previous ultrasounds, step-by-step treatment options with rationale for each step-reviewing the above plan in detail.  All questions  answered.  Finis Bud, M.D. 06/17/2018 12:22 PM

## 2018-06-28 ENCOUNTER — Telehealth: Payer: Self-pay

## 2018-06-28 NOTE — Telephone Encounter (Signed)
Following up with Pt about Telephone Advice on 06/27/2018. Pt was advised to visit the Emergency Dept., in which she has not. Pt did not answer the phone call.

## 2018-06-29 ENCOUNTER — Encounter: Payer: Self-pay | Admitting: Emergency Medicine

## 2018-06-29 ENCOUNTER — Emergency Department
Admission: EM | Admit: 2018-06-29 | Discharge: 2018-06-29 | Disposition: A | Discharge status: Home / Self Care | Payer: Medicare Other | Attending: Emergency Medicine | Admitting: Emergency Medicine

## 2018-06-29 ENCOUNTER — Other Ambulatory Visit: Payer: Self-pay

## 2018-06-29 ENCOUNTER — Emergency Department: Payer: Medicare Other

## 2018-06-29 DIAGNOSIS — R109 Unspecified abdominal pain: Secondary | ICD-10-CM

## 2018-06-29 DIAGNOSIS — F17210 Nicotine dependence, cigarettes, uncomplicated: Secondary | ICD-10-CM | POA: Diagnosis not present

## 2018-06-29 DIAGNOSIS — Z79899 Other long term (current) drug therapy: Secondary | ICD-10-CM | POA: Insufficient documentation

## 2018-06-29 DIAGNOSIS — E039 Hypothyroidism, unspecified: Secondary | ICD-10-CM | POA: Diagnosis not present

## 2018-06-29 DIAGNOSIS — N189 Chronic kidney disease, unspecified: Secondary | ICD-10-CM | POA: Diagnosis not present

## 2018-06-29 DIAGNOSIS — R1030 Lower abdominal pain, unspecified: Secondary | ICD-10-CM | POA: Insufficient documentation

## 2018-06-29 LAB — URINALYSIS, COMPLETE (UACMP) WITH MICROSCOPIC
Bilirubin Urine: NEGATIVE
Glucose, UA: NEGATIVE mg/dL
Ketones, ur: NEGATIVE mg/dL
Nitrite: NEGATIVE
Protein, ur: NEGATIVE mg/dL
Specific Gravity, Urine: 1.017 (ref 1.005–1.030)
pH: 6 (ref 5.0–8.0)

## 2018-06-29 LAB — CBC
HCT: 40.8 % (ref 35.0–47.0)
Hemoglobin: 14.8 g/dL (ref 12.0–16.0)
MCH: 33.6 pg (ref 26.0–34.0)
MCHC: 36.3 g/dL — ABNORMAL HIGH (ref 32.0–36.0)
MCV: 92.7 fL (ref 80.0–100.0)
Platelets: 247 10*3/uL (ref 150–440)
RBC: 4.4 MIL/uL (ref 3.80–5.20)
RDW: 11.5 % (ref 11.5–14.5)
WBC: 8.6 10*3/uL (ref 3.6–11.0)

## 2018-06-29 LAB — COMPREHENSIVE METABOLIC PANEL
ALT: 13 U/L (ref 0–44)
AST: 20 U/L (ref 15–41)
Albumin: 4.6 g/dL (ref 3.5–5.0)
Alkaline Phosphatase: 50 U/L (ref 38–126)
Anion gap: 9 (ref 5–15)
BUN: 6 mg/dL (ref 6–20)
CO2: 27 mmol/L (ref 22–32)
Calcium: 9 mg/dL (ref 8.9–10.3)
Chloride: 102 mmol/L (ref 98–111)
Creatinine, Ser: 0.62 mg/dL (ref 0.44–1.00)
GFR calc Af Amer: >60 mL/min (ref >60)
GFR calc non Af Amer: >60 mL/min (ref >60)
Glucose, Bld: 98 mg/dL (ref 70–99)
Potassium: 3.6 mmol/L (ref 3.5–5.1)
Sodium: 138 mmol/L (ref 135–145)
Total Bilirubin: 0.8 mg/dL (ref 0.3–1.2)
Total Protein: 7.9 g/dL (ref 6.5–8.1)

## 2018-06-29 LAB — POCT PREGNANCY, URINE: Preg Test, Ur: NEGATIVE

## 2018-06-29 LAB — LIPASE, BLOOD: Lipase: 25 U/L (ref 11–51)

## 2018-06-29 MED ORDER — ONDANSETRON 4 MG PO TBDP
4.0000 mg | ORAL_TABLET | Freq: Once | ORAL | Status: AC
  Administered 2018-06-29: 4 mg via ORAL
  Filled 2018-06-29: qty 1

## 2018-06-29 MED ORDER — KETOROLAC TROMETHAMINE 60 MG/2ML IM SOLN
60.0000 mg | Freq: Once | INTRAMUSCULAR | Status: AC
  Administered 2018-06-29: 60 mg via INTRAMUSCULAR
  Filled 2018-06-29: qty 2

## 2018-06-29 MED ORDER — ONDANSETRON 4 MG PO TBDP: 4.0000 mg | ORAL_TABLET | Freq: Three times a day (TID) | ORAL | 0 refills | Status: DC | PRN

## 2018-06-29 NOTE — ED Triage Notes (Addendum)
Pt c/o abdominal pain, states its both ovaries causing the pain. Pain started over the weekend.   Pt describes the pain as a knife stabbing her.  Pt reports V/D as well that started over the weekend.   No diarrhea in 2 days, vomiting today.

## 2018-06-29 NOTE — ED Provider Notes (Signed)
[facility] Emergency Department Provider Note  Time seen: 4:20 PM  I have reviewed the triage vital signs and the nursing notes.   HISTORY  Chief Complaint Abdominal Pain; Emesis; and Diarrhea    HPI Hannah Shaw is a 22 y.o. female with a past medical history of asthma, endometriosis, gastric reflux, presents to the emergency department for abdominal pain.  According to the patient for the past 4 or 5 days she has been experiencing bilateral abdominal pain located across both of her flanks.  States she has been nauseated with occasional episodes of vomiting.  Denies any diarrhea, dysuria, hematuria, vaginal bleeding or vaginal discharge.  Patient states she has a history of endometriosis as well as ovarian cyst which is feels somewhat identical however patient points to her mid upper abdomen just below the ribs on both sides to where her pain is coming from.  States occasional radiation into her back as well.   Past Medical History:  Diagnosis Date  . Asthma    WELL CONTROLLED  . Chronic kidney disease    H/O STONES  . Endometriosis   . GERD (gastroesophageal reflux disease)   . Headache   . Hypothyroidism   . Thyroid disease     There are no active problems to display for this patient.   Past Surgical History:  Procedure Laterality Date  . APPENDECTOMY    . ENDOMETRIAL BIOPSY    . LAPAROSCOPY N/A 03/13/2016   Procedure: LAPAROSCOPY DIAGNOSTIC;  Surgeon: Honor Loh Ward, MD;  Location: [facility] ORS;  Service: Gynecology;  Laterality: N/A;    Prior to Admission medications   Medication Sig Start Date End Date Taking? Authorizing Provider  clonazePAM (KLONOPIN) 1 MG tablet Take 1 mg by mouth 2 (two) times daily as needed.    [provider]  desogestrel-ethinyl estradiol (MIRCETTE) 0.15-0.02/0.01 MG (21/5) tablet Take 1 tablet by mouth at bedtime. 06/17/18   Harlin Heys, MD  FLUoxetine (PROZAC) 20 MG capsule Take 1 capsule by mouth daily.  08/27/16   [provider]  ibuprofen (ADVIL,MOTRIN) 600 MG tablet Take 1 tablet (600 mg total) by mouth every 6 (six) hours as needed. 12/29/16   Harvest Dark, MD  levothyroxine (SYNTHROID, LEVOTHROID) 25 MCG tablet Take 25 mcg by mouth daily before breakfast.     [provider]  oxyCODONE-acetaminophen (PERCOCET) 7.5-325 MG tablet Take 1 tablet by mouth 2 (two) times daily. 09/24/16   [provider]    Allergies  Allergen Reactions  . Morphine And Related Hives and Shortness Of Breath  . Fentanyl     PT DENIES THIS ALLERGY DURING PHONE INTERVIEW ON 03-05-16  . Betadine [Povidone Iodine] Rash  . Iodine Rash    Family History  Problem Relation Age of Onset  . Cervical cancer Mother   . Lung cancer Paternal Uncle     Social History Social History   Tobacco Use  . Smoking status: Current Every Day Smoker    Packs/day: 0.25    Types: Cigarettes  . Smokeless tobacco: Never Used  Substance Use Topics  . Alcohol use: No  . Drug use: No    Review of Systems Constitutional: Negative for fever. Cardiovascular: Negative for chest pain. Respiratory: Negative for shortness of breath. Gastrointestinal: Bilateral flank pain.  Positive for nausea and vomiting.  Negative for diarrhea Genitourinary: Negative for urinary compaints.  Negative for vaginal bleeding or discharge.   Musculoskeletal: Negative for musculoskeletal complaints Skin: Negative for skin complaints  Neurological:  Negative for headache All other ROS negative  ____________________________________________   PHYSICAL EXAM:  VITAL SIGNS: ED Triage Vitals  Enc Vitals Group     BP 06/29/18 1440 104/69     Pulse Rate 06/29/18 1440 92     Resp 06/29/18 1440 18     Temp 06/29/18 1440 99.5 F (37.5 C)     Temp Source 06/29/18 1440 Oral     SpO2 06/29/18 1440 100 %     Weight 06/29/18 1438 97 lb (44 kg)     Height 06/29/18 1438 5\' 2"  (1.575 m)     Head Circumference --      Peak Flow  --      Pain Score 06/29/18 1438 10     Pain Loc --      Pain Edu? --      Excl. in [place]? --     Constitutional: Alert and oriented. Well appearing and in no distress. Eyes: Normal exam ENT   Head: Normocephalic and atraumatic.   Mouth/Throat: Mucous membranes are moist. Cardiovascular: Normal rate, regular rhythm. No murmur Respiratory: Normal respiratory effort without tachypnea nor retractions. Breath sounds are clear  Gastrointestinal: Soft, mild tenderness across the right upper and left upper quadrants, no lower abdominal tenderness, no CVA tenderness. Musculoskeletal: Nontender with normal range of motion in all extremities. Neurologic:  Normal speech and language. No gross focal neurologic deficits  Skin:  Skin is warm, dry and intact.  Psychiatric: Mood and affect are normal.   ____________________________________________   RADIOLOGY  CT scan negative for acute abnormality  ____________________________________________   INITIAL IMPRESSION / ASSESSMENT AND PLAN / ED COURSE  Pertinent labs & imaging results that were available during my care of the patient were reviewed by me and considered in my medical decision making (see chart for details).  Patient presents to the emergency department for bilateral flank pain ongoing for the past for 5 days.  States nausea and vomiting as well denies diarrhea.  Patient has tenderness in the bilateral flanks especially upper quadrants nothing across the lower abdomen denies any dysuria hematuria vaginal bleeding or discharge.  Patient's lab work is reassuring, labs are resulted largely within normal limits including normal white blood cell count, not pregnant.  Patient strongly wishes to have a CT scan.  I discussed with the patient that at her age we try to limit radiation as much as we can with normal labs and bilateral tenderness that is unlikely to show anything significant.  Patient still wishes to proceed with CT imaging, I did  discuss radiation risk with the patient she still wishes to proceed.  I discussed the possibility that this could be due to vomiting/retching causing abdominal soreness.  We will treat with Zofran, IM Toradol and obtain CT imaging.  CT scan negative for acute abnormality.  Labs are largely within normal limits.  We will discharge the patient with PCP follow-up.  ____________________________________________   FINAL CLINICAL IMPRESSION(S) / ED DIAGNOSES  Abdominal pain    Harvest Dark, MD 06/29/18 1731

## 2018-08-04 ENCOUNTER — Emergency Department
Admission: EM | Admit: 2018-08-04 | Discharge: 2018-08-04 | Disposition: A | Discharge status: Home / Self Care | Payer: Medicare Other | Attending: Emergency Medicine | Admitting: Emergency Medicine

## 2018-08-04 ENCOUNTER — Encounter: Payer: Self-pay | Admitting: Emergency Medicine

## 2018-08-04 ENCOUNTER — Other Ambulatory Visit: Payer: Self-pay

## 2018-08-04 ENCOUNTER — Emergency Department: Payer: Medicare Other

## 2018-08-04 DIAGNOSIS — S39012A Strain of muscle, fascia and tendon of lower back, initial encounter: Secondary | ICD-10-CM | POA: Diagnosis not present

## 2018-08-04 DIAGNOSIS — Y9389 Activity, other specified: Secondary | ICD-10-CM | POA: Diagnosis not present

## 2018-08-04 DIAGNOSIS — Y92410 Unspecified street and highway as the place of occurrence of the external cause: Secondary | ICD-10-CM | POA: Insufficient documentation

## 2018-08-04 DIAGNOSIS — Z79899 Other long term (current) drug therapy: Secondary | ICD-10-CM | POA: Diagnosis not present

## 2018-08-04 DIAGNOSIS — S199XXA Unspecified injury of neck, initial encounter: Secondary | ICD-10-CM | POA: Diagnosis present

## 2018-08-04 DIAGNOSIS — S161XXA Strain of muscle, fascia and tendon at neck level, initial encounter: Secondary | ICD-10-CM | POA: Insufficient documentation

## 2018-08-04 DIAGNOSIS — E039 Hypothyroidism, unspecified: Secondary | ICD-10-CM | POA: Insufficient documentation

## 2018-08-04 DIAGNOSIS — Y999 Unspecified external cause status: Secondary | ICD-10-CM | POA: Diagnosis not present

## 2018-08-04 DIAGNOSIS — F17210 Nicotine dependence, cigarettes, uncomplicated: Secondary | ICD-10-CM | POA: Insufficient documentation

## 2018-08-04 DIAGNOSIS — J45909 Unspecified asthma, uncomplicated: Secondary | ICD-10-CM | POA: Insufficient documentation

## 2018-08-04 LAB — URINALYSIS, COMPLETE (UACMP) WITH MICROSCOPIC
Bilirubin Urine: NEGATIVE
Glucose, UA: NEGATIVE mg/dL
Ketones, ur: NEGATIVE mg/dL
Leukocytes, UA: NEGATIVE
Nitrite: NEGATIVE
Protein, ur: NEGATIVE mg/dL
Specific Gravity, Urine: 1.006 (ref 1.005–1.030)
pH: 7 (ref 5.0–8.0)

## 2018-08-04 LAB — POCT PREGNANCY, URINE: Preg Test, Ur: NEGATIVE

## 2018-08-04 MED ORDER — ONDANSETRON 8 MG PO TBDP
8.0000 mg | ORAL_TABLET | Freq: Once | ORAL | Status: AC
  Administered 2018-08-04: 8 mg via ORAL
  Filled 2018-08-04: qty 1

## 2018-08-04 MED ORDER — METHOCARBAMOL 500 MG PO TABS: 500.0000 mg | ORAL_TABLET | Freq: Four times a day (QID) | ORAL | 0 refills | Status: DC

## 2018-08-04 MED ORDER — MELOXICAM 15 MG PO TABS: 15.0000 mg | ORAL_TABLET | Freq: Every day | ORAL | 0 refills | Status: DC

## 2018-08-04 MED ORDER — HYDROCODONE-ACETAMINOPHEN 5-325 MG PO TABS
1.0000 | ORAL_TABLET | Freq: Once | ORAL | Status: AC
  Administered 2018-08-04: 1 via ORAL
  Filled 2018-08-04: qty 1
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
## 2018-10-13 ENCOUNTER — Emergency Department
Admission: EM | Admit: 2018-10-13 | Discharge: 2018-10-13 | Disposition: A | Discharge status: Home / Self Care | Payer: Medicare Other | Attending: Emergency Medicine | Admitting: Emergency Medicine

## 2018-10-13 ENCOUNTER — Other Ambulatory Visit: Payer: Self-pay

## 2018-10-13 ENCOUNTER — Emergency Department: Payer: Medicare Other

## 2018-10-13 DIAGNOSIS — F17210 Nicotine dependence, cigarettes, uncomplicated: Secondary | ICD-10-CM | POA: Insufficient documentation

## 2018-10-13 DIAGNOSIS — Z79899 Other long term (current) drug therapy: Secondary | ICD-10-CM | POA: Diagnosis not present

## 2018-10-13 DIAGNOSIS — R102 Pelvic and perineal pain: Secondary | ICD-10-CM | POA: Insufficient documentation

## 2018-10-13 LAB — COMPREHENSIVE METABOLIC PANEL
ALT: 21 U/L (ref 0–44)
AST: 21 U/L (ref 15–41)
Albumin: 4.2 g/dL (ref 3.5–5.0)
Alkaline Phosphatase: 40 U/L (ref 38–126)
Anion gap: 5 (ref 5–15)
BUN: 6 mg/dL (ref 6–20)
CO2: 26 mmol/L (ref 22–32)
Calcium: 8.7 mg/dL — ABNORMAL LOW (ref 8.9–10.3)
Chloride: 107 mmol/L (ref 98–111)
Creatinine, Ser: 0.61 mg/dL (ref 0.44–1.00)
GFR calc Af Amer: >60 mL/min (ref >60)
GFR calc non Af Amer: >60 mL/min (ref >60)
Glucose, Bld: 84 mg/dL (ref 70–99)
Potassium: 3.3 mmol/L — ABNORMAL LOW (ref 3.5–5.1)
Sodium: 138 mmol/L (ref 135–145)
Total Bilirubin: 0.7 mg/dL (ref 0.3–1.2)
Total Protein: 7.1 g/dL (ref 6.5–8.1)

## 2018-10-13 LAB — CBC
HCT: 39.7 % (ref 36.0–46.0)
Hemoglobin: 13.8 g/dL (ref 12.0–15.0)
MCH: 33 pg (ref 26.0–34.0)
MCHC: 34.8 g/dL (ref 30.0–36.0)
MCV: 95 fL (ref 80.0–100.0)
Platelets: 222 10*3/uL (ref 150–400)
RBC: 4.18 MIL/uL (ref 3.87–5.11)
RDW: 11.3 % — ABNORMAL LOW (ref 11.5–15.5)
WBC: 6.5 10*3/uL (ref 4.0–10.5)
nRBC: 0 % (ref 0.0–0.2)

## 2018-10-13 LAB — URINALYSIS, COMPLETE (UACMP) WITH MICROSCOPIC
Bacteria, UA: NONE SEEN
Bilirubin Urine: NEGATIVE
Glucose, UA: NEGATIVE mg/dL
Ketones, ur: NEGATIVE mg/dL
Leukocytes, UA: NEGATIVE
Nitrite: NEGATIVE
Protein, ur: NEGATIVE mg/dL
Specific Gravity, Urine: 1.016 (ref 1.005–1.030)
pH: 7 (ref 5.0–8.0)

## 2018-10-13 LAB — LIPASE, BLOOD: Lipase: 27 U/L (ref 11–51)

## 2018-10-13 LAB — HCG, QUANTITATIVE, PREGNANCY: hCG, Beta Chain, Quant, S: <1 m[IU]/mL (ref <5)

## 2018-10-13 LAB — POCT PREGNANCY, URINE
Preg Test, Ur: NEGATIVE
Preg Test, Ur: NEGATIVE

## 2018-10-13 MED ORDER — TRAMADOL HCL 50 MG PO TABS
50.0000 mg | ORAL_TABLET | Freq: Once | ORAL | Status: AC
  Administered 2018-10-13: 50 mg via ORAL
  Filled 2018-10-13: qty 1

## 2018-10-13 NOTE — ED Triage Notes (Signed)
pelvic pain and lower abdominal pain bilaterally x 2 days. Nausea. Denies diarrhea.

## 2018-10-13 NOTE — ED Provider Notes (Signed)
[facility] Emergency Department Provider Note  ____________________________________________  Time seen: Approximately 4:40 PM  I have reviewed the triage vital signs and the nursing notes.   HISTORY  Chief Complaint Abdominal Pain    HPI Hannah Shaw is a 22 y.o. female presents to the emergency department with 8 out of 10 suprapubic abdominal pain for the past 2 days.  Past medical history was reviewed in the EMR and patient had similar complaints in April 2019 following suspected miscarriage.  Patient has had one episode of emesis last night but no diarrhea.  Pain is worsened with ambulation and relieved with rest.  No change in abdominal pain with food or drink consumption.  She denies dysuria, hematuria or increased urinary frequency.  No flank pain.  No fever noted at home.  Patient denies changes in vaginal discharge or dyspareunia.  No alleviating measures have been attempted.   Past Medical History:  Diagnosis Date  . Asthma    WELL CONTROLLED  . Chronic kidney disease    H/O STONES  . Endometriosis   . GERD (gastroesophageal reflux disease)   . Headache   . Hypothyroidism   . Thyroid disease     There are no active problems to display for this patient.   Past Surgical History:  Procedure Laterality Date  . APPENDECTOMY    . ENDOMETRIAL BIOPSY    . LAPAROSCOPY N/A 03/13/2016   Procedure: LAPAROSCOPY DIAGNOSTIC;  Surgeon: Honor Loh Ward, MD;  Location: [facility] ORS;  Service: Gynecology;  Laterality: N/A;    Prior to Admission medications   Medication Sig Start Date End Date Taking? Authorizing Provider  clonazePAM (KLONOPIN) 1 MG tablet Take 1 mg by mouth 2 (two) times daily as needed.    [provider]  desogestrel-ethinyl estradiol (MIRCETTE) 0.15-0.02/0.01 MG (21/5) tablet Take 1 tablet by mouth at bedtime. 06/17/18   Harlin Heys, MD  FLUoxetine (PROZAC) 20 MG capsule Take 1 capsule by mouth daily. 08/27/16   [provider]  ibuprofen (ADVIL,MOTRIN) 600 MG tablet Take 1 tablet (600 mg total) by mouth every 6 (six) hours as needed. 12/29/16   Harvest Dark, MD  levothyroxine (SYNTHROID, LEVOTHROID) 25 MCG tablet Take 25 mcg by mouth daily before breakfast.     [provider]  meloxicam (MOBIC) 15 MG tablet Take 1 tablet (15 mg total) by mouth daily. 08/04/18   Cuthriell, Charline Bills, PA-C  methocarbamol (ROBAXIN) 500 MG tablet Take 1 tablet (500 mg total) by mouth 4 (four) times daily. 08/04/18   Cuthriell, Charline Bills, PA-C  ondansetron (ZOFRAN ODT) 4 MG disintegrating tablet Take 1 tablet (4 mg total) by mouth every 8 (eight) hours as needed for nausea or vomiting. 06/29/18   Harvest Dark, MD  oxyCODONE-acetaminophen (PERCOCET) 7.5-325 MG tablet Take 1 tablet by mouth 2 (two) times daily. 09/24/16   [provider]    Allergies Morphine and related; Fentanyl; Betadine [povidone iodine]; and Iodine  Family History  Problem Relation Age of Onset  . Cervical cancer Mother   . Lung cancer Paternal Uncle     Social History Social History   Tobacco Use  . Smoking status: Current Every Day Smoker    Packs/day: 0.25    Types: Cigarettes  . Smokeless tobacco: Never Used  Substance Use Topics  . Alcohol use: No  . Drug use: No     Review of Systems  Constitutional: No fever/chills Eyes: No visual changes. No discharge ENT: No upper respiratory complaints.  Cardiovascular: no chest pain. Respiratory: no cough. No SOB. Gastrointestinal: Patient has suprapubic pain.  No nausea, no vomiting.  No diarrhea.  No constipation. Genitourinary: Negative for dysuria. No hematuria Musculoskeletal: Negative for musculoskeletal pain. Skin: Negative for rash, abrasions, lacerations, ecchymosis. Neurological: Negative for headaches, focal weakness or numbness.   ____________________________________________   PHYSICAL EXAM:  VITAL SIGNS: ED Triage Vitals  Enc Vitals Group      BP 10/13/18 1433 102/68     Pulse Rate 10/13/18 1433 100     Resp 10/13/18 1433 (!) 24     Temp 10/13/18 1433 98.9 F (37.2 C)     Temp Source 10/13/18 1433 Oral     SpO2 10/13/18 1433 100 %     Weight 10/13/18 1434 99 lb 3.2 oz (45 kg)     Height 10/13/18 1434 5\' 2"  (1.575 m)     Head Circumference --      Peak Flow --      Pain Score 10/13/18 1440 10     Pain Loc --      Pain Edu? --      Excl. in [place]? --      Constitutional: Alert and oriented. Well appearing and in no acute distress. Eyes: Conjunctivae are normal. PERRL. EOMI. Head: Atraumatic.  Cardiovascular: Normal rate, regular rhythm. Normal S1 and S2.  Good peripheral circulation. Respiratory: Normal respiratory effort without tachypnea or retractions. Lungs CTAB. Good air entry to the bases with no decreased or absent breath sounds. Gastrointestinal: Bowel sounds 4 quadrants.  Patient has suprapubic tenderness with guarding on physical exam.  No palpable masses.  No rigidity. Genitourinary: Deferred  Skin:  Skin is warm, dry and intact. No rash noted. Psychiatric: Mood and affect are normal. Speech and behavior are normal. Patient exhibits appropriate insight and judgement.   ____________________________________________   LABS (all labs ordered are listed, but only abnormal results are displayed)  Labs Reviewed  COMPREHENSIVE METABOLIC PANEL - Abnormal; Notable for the following components:      Result Value   Potassium 3.3 (*)    Calcium 8.7 (*)    All other components within normal limits  CBC - Abnormal; Notable for the following components:   RDW 11.3 (*)    All other components within normal limits  URINALYSIS, COMPLETE (UACMP) WITH MICROSCOPIC - Abnormal; Notable for the following components:   Color, Urine YELLOW (*)    APPearance CLEAR (*)    Hgb urine dipstick SMALL (*)    All other components within normal limits  LIPASE, BLOOD  HCG, QUANTITATIVE, PREGNANCY  POC URINE PREG, ED  POCT  PREGNANCY, URINE  POCT PREGNANCY, URINE   ____________________________________________  EKG   ____________________________________________  RADIOLOGY  I personally viewed and evaluated these images as part of my medical decision making, as well as reviewing the written report by the radiologist.    US Pelvic Complete W Transvaginal And Torsion R/o  Result Date: 10/13/2018 CLINICAL DATA:  Pelvic pain for 4 days EXAM: TRANSABDOMINAL AND TRANSVAGINAL ULTRASOUND OF PELVIS DOPPLER ULTRASOUND OF OVARIES TECHNIQUE: Both transabdominal and transvaginal ultrasound examinations of the pelvis were performed. Transabdominal technique was performed for global imaging of the pelvis including uterus, ovaries, adnexal regions, and pelvic cul-de-sac. It was necessary to proceed with endovaginal exam following the transabdominal exam to visualize the endometrium and ovaries. Color and duplex Doppler ultrasound was utilized to evaluate blood flow to the ovaries. COMPARISON:  None FINDINGS: Uterus Measurements: 6.0 x 3.5 x 3.5 cm = volume:  37.3 mL. Normal morphology without mass. Nabothian cysts at cervix Endometrium Thickness: 10 mm, normal.  No endometrial fluid or focal abnormality Right ovary Measurements: 2.1 x 1.1 x 1.1 cm = volume: 1.4 mL. Normal morphology without mass. Internal blood flow present on color Doppler imaging. Left ovary Measurements: 2.9 x 1.5 x 2.2 cm = volume: 5.0 mL. Normal morphology without mass. Internal blood flow present on color Doppler imaging. Pulsed Doppler evaluation of both ovaries demonstrates normal low-resistance arterial and venous waveforms. Other findings Small amount of nonspecific free pelvic fluid.  No adnexal masses. IMPRESSION: Small amount of nonspecific free pelvic fluid. Otherwise normal exam. No sonographic evidence of ovarian mass or torsion. Electronically Signed   By: Lavonia Dana M.D.   On: 10/13/2018 17:48     ____________________________________________    PROCEDURES  Procedure(s) performed:    Procedures    Medications  traMADol (ULTRAM) tablet 50 mg (50 mg Oral Given 10/13/18 1701)     ____________________________________________   INITIAL IMPRESSION / ASSESSMENT AND PLAN / ED COURSE  Pertinent labs & imaging results that were available during my care of the patient were reviewed by me and considered in my medical decision making (see chart for details).  Review of the [place] CSRS was performed in accordance of the [place] prior to dispensing any controlled drugs.      Assessment and Plan: Suprapubic Pain:  Patient presents to the emergency department with suprapubic pain for the past 2 days.  On physical exam, patient had tenderness to palpation with some guarding consistent with prior complaints of abdominal pain in April that resolved without intervention.  Urinalysis was noncontributory for cystitis in the emergency department.  Beta hCG and urine pregnancy testing were not consistent with pregnancy.  Pelvic ultrasound revealed no acute abnormalities.  CBC reveals no leukocytosis that would suggest infection.  Patient was given tramadol in the emergency department.  [place] drug database was consulted and patient just received a 30-day supply of oxycodone 7-1/2 mg tablets.  Patient was not prescribed any additional pain medication at discharge. She was advised to follow up with primary care as needed. All patient questions were answered.    ____________________________________________  FINAL CLINICAL IMPRESSION(S) / ED DIAGNOSES  Final diagnoses:  Pelvic pain      NEW MEDICATIONS STARTED DURING THIS VISIT:  ED Discharge Orders    None          This chart was dictated using voice recognition software/Dragon. Despite best efforts to proofread, errors can occur which can change the meaning. Any change was purely unintentional.    Lannie Fields,  PA-C 10/13/18 1857    Nena Polio, MD 10/14/18 [phone]

## 2018-10-13 NOTE — ED Notes (Signed)
Called lab who said they could run test off of blood they already have

## 2018-10-27 ENCOUNTER — Encounter: Payer: Medicare Other | Admitting: Obstetrics and Gynecology

## 2018-10-28 ENCOUNTER — Telehealth: Payer: Self-pay | Admitting: Surgical

## 2018-10-28 NOTE — Telephone Encounter (Signed)
Patient called in and stated that she has heavy vaginal bleeding that is running down her legs with severe abdominal cramping. I advised patient that she would need to go to the ED to be seen. Patients mother said that they never do anything for her. I explained that there is not a doctor in the building so there isn't anything we would be able to do here. Patient and mother verbalized understanding.

## 2018-11-02 ENCOUNTER — Encounter: Payer: Self-pay | Admitting: Obstetrics and Gynecology

## 2018-11-02 ENCOUNTER — Ambulatory Visit (INDEPENDENT_AMBULATORY_CARE_PROVIDER_SITE_OTHER): Payer: Medicare Other | Admitting: Obstetrics and Gynecology

## 2018-11-02 VITALS — BP 105/71 | HR 92 | Ht 62.0 in | Wt 93.2 lb

## 2018-11-02 DIAGNOSIS — R102 Pelvic and perineal pain: Secondary | ICD-10-CM | POA: Diagnosis not present

## 2018-11-02 DIAGNOSIS — N809 Endometriosis, unspecified: Secondary | ICD-10-CM

## 2018-11-02 MED ORDER — NORETHINDRONE 0.35 MG PO TABS: 1.0000 | ORAL_TABLET | Freq: Every day | ORAL | 11 refills | Status: DC

## 2018-11-02 MED ORDER — ELAGOLIX SODIUM 150 MG PO TABS: 150.0000 mg | ORAL_TABLET | Freq: Every day | ORAL | 1 refills | Status: DC

## 2018-11-02 NOTE — Progress Notes (Signed)
HPI:      [name] is a 23 y.o. G1P0010 who LMP was Patient's last menstrual period was 10/25/2018 (approximate).  Subjective:   She presents today complaining of a 10 out of 10 pelvic pain.  She has been to the emergency department for this pain in the last 2 weeks.  An ultrasound at that time showed no pelvic abnormalities. The patient interacts normally laughing talking moving with no evidence of pain. She has a history of laparoscopically diagnosed endometriosis. She is questioning whether or not she can have a hysterectomy, but then 2 minutes later she is talking about possibly getting pregnant. Her partner is with her and also provides very little additional input. She says that she is taking her OCPs as directed.    Hx: The following portions of the patient's history were reviewed and updated as appropriate:             She  has a past medical history of Asthma, Chronic kidney disease, Endometriosis, GERD (gastroesophageal reflux disease), Headache, Hypothyroidism, and Thyroid disease. She does not have any pertinent problems on file. She  has a past surgical history that includes Appendectomy; Endometrial biopsy; and laparoscopy (N/A, 03/13/2016). Her family history includes Cervical cancer in her mother; Lung cancer in her paternal uncle. She  reports that she has been smoking cigarettes. She has been smoking about 0.25 packs per day. She has never used smokeless tobacco. She reports that she does not drink alcohol or use drugs. She has a current medication list which includes the following prescription(s): clonazepam, desogestrel-ethinyl estradiol, fluoxetine, ibuprofen, levothyroxine, meloxicam, methocarbamol, ondansetron, oxycodone-acetaminophen, elagolix sodium, and norethindrone. She is allergic to morphine and related; fentanyl; betadine [povidone iodine]; and iodine.       Review of Systems:  Review of Systems  Constitutional: Denied constitutional symptoms, night  sweats, recent illness, fatigue, fever, insomnia and weight loss.  Eyes: Denied eye symptoms, eye pain, photophobia, vision change and visual disturbance.  Ears/Nose/Throat/Neck: Denied ear, nose, throat or neck symptoms, hearing loss, nasal discharge, sinus congestion and sore throat.  Cardiovascular: Denied cardiovascular symptoms, arrhythmia, chest pain/pressure, edema, exercise intolerance, orthopnea and palpitations.  Respiratory: Denied pulmonary symptoms, asthma, pleuritic pain, productive sputum, cough, dyspnea and wheezing.  Gastrointestinal: Denied, gastro-esophageal reflux, melena, nausea and vomiting.  Genitourinary: See HPI for additional information.  Musculoskeletal: Denied musculoskeletal symptoms, stiffness, swelling, muscle weakness and myalgia.  Dermatologic: Denied dermatology symptoms, rash and scar.  Neurologic: Denied neurology symptoms, dizziness, headache, neck pain and syncope.  Psychiatric: Denied psychiatric symptoms, anxiety and depression.  Endocrine: Denied endocrine symptoms including hot flashes and night sweats.   Meds:   Current Outpatient Medications on File Prior to Visit  Medication Sig Dispense Refill  . clonazePAM (KLONOPIN) 1 MG tablet Take 1 mg by mouth 2 (two) times daily as needed.    . desogestrel-ethinyl estradiol (MIRCETTE) 0.15-0.02/0.01 MG (21/5) tablet Take 1 tablet by mouth at bedtime. 1 Package 2  . FLUoxetine (PROZAC) 20 MG capsule Take 1 capsule by mouth daily.    Marland Kitchen ibuprofen (ADVIL,MOTRIN) 600 MG tablet Take 1 tablet (600 mg total) by mouth every 6 (six) hours as needed. 30 tablet 0  . levothyroxine (SYNTHROID, LEVOTHROID) 25 MCG tablet Take 25 mcg by mouth daily before breakfast.     . meloxicam (MOBIC) 15 MG tablet Take 1 tablet (15 mg total) by mouth daily. 30 tablet 0  . methocarbamol (ROBAXIN) 500 MG tablet Take 1 tablet (500 mg total) by mouth 4 (four)  times daily. 16 tablet 0  . ondansetron (ZOFRAN ODT) 4 MG disintegrating tablet  Take 1 tablet (4 mg total) by mouth every 8 (eight) hours as needed for nausea or vomiting. 20 tablet 0  . oxyCODONE-acetaminophen (PERCOCET) 7.5-325 MG tablet Take 1 tablet by mouth 2 (two) times daily.     No current facility-administered medications on file prior to visit.     Objective:     Vitals:   11/02/18 1430  BP: 105/71  Pulse: 92              Normal ultrasound findings reviewed with the patient.  Assessment:    G1P0010 There are no active problems to display for this patient.    1. Pelvic pain in female   2. Endometriosis determined by laparoscopy     Her pelvic pain is likely secondary to endometriosis.  Failure of OCPs.   Plan:            1.  Discussed multiple options with the patient and she has decided upon [place] for endometriosis.  I have informed her that she must change birth control methods and despite repeatedly reviewing the risks and benefits she has refused condom use and IUD use and the only method of birth control that she will except is Micronor.  I have made a plan to her that this may decrease the effectiveness of Orilissa. Orders No orders of the defined types were placed in this encounter.    Meds ordered this encounter  Medications  . Elagolix Sodium (ORILISSA) 150 MG TABS    Sig: Take 150 mg by mouth daily.    Dispense:  30 tablet    Refill:  1  . norethindrone (MICRONOR,CAMILA,ERRIN) 0.35 MG tablet    Sig: Take 1 tablet (0.35 mg total) by mouth daily.    Dispense:  1 Package    Refill:  11      F/U  Return in about 4 weeks (around 11/30/2018). I spent 19 minutes involved in the care of this patient of which greater than 50% was spent discussing endometriosis, pelvic pain, birth control methods, use of Orilissa, hysterectomy for endometriosis, future childbirth.  All questions answered.  Finis Bud, M.D. 11/02/2018 3:57 PM

## 2018-11-02 NOTE — Progress Notes (Signed)
Patient comes in today for follow up from the ED for pelvic pain. Patient states that she is still having pelvic pain at pain scale of 10.

## 2018-11-13 ENCOUNTER — Emergency Department
Admission: EM | Admit: 2018-11-13 | Discharge: 2018-11-13 | Disposition: A | Discharge status: Home / Self Care | Payer: Medicare Other | Attending: Emergency Medicine | Admitting: Emergency Medicine

## 2018-11-13 ENCOUNTER — Encounter: Payer: Self-pay | Admitting: Emergency Medicine

## 2018-11-13 DIAGNOSIS — N189 Chronic kidney disease, unspecified: Secondary | ICD-10-CM | POA: Insufficient documentation

## 2018-11-13 DIAGNOSIS — F1193 Opioid use, unspecified with withdrawal: Secondary | ICD-10-CM

## 2018-11-13 DIAGNOSIS — E039 Hypothyroidism, unspecified: Secondary | ICD-10-CM | POA: Diagnosis not present

## 2018-11-13 DIAGNOSIS — Z79899 Other long term (current) drug therapy: Secondary | ICD-10-CM | POA: Diagnosis not present

## 2018-11-13 DIAGNOSIS — G8929 Other chronic pain: Secondary | ICD-10-CM | POA: Diagnosis not present

## 2018-11-13 DIAGNOSIS — F17210 Nicotine dependence, cigarettes, uncomplicated: Secondary | ICD-10-CM | POA: Insufficient documentation

## 2018-11-13 DIAGNOSIS — J45909 Unspecified asthma, uncomplicated: Secondary | ICD-10-CM | POA: Insufficient documentation

## 2018-11-13 DIAGNOSIS — R102 Pelvic and perineal pain: Secondary | ICD-10-CM | POA: Diagnosis present

## 2018-11-13 DIAGNOSIS — F1123 Opioid dependence with withdrawal: Secondary | ICD-10-CM

## 2018-11-13 LAB — COMPREHENSIVE METABOLIC PANEL
ALT: 20 U/L (ref 0–44)
AST: 20 U/L (ref 15–41)
Albumin: 4.8 g/dL (ref 3.5–5.0)
Alkaline Phosphatase: 43 U/L (ref 38–126)
Anion gap: 8 (ref 5–15)
BUN: 7 mg/dL (ref 6–20)
CO2: 22 mmol/L (ref 22–32)
Calcium: 9.1 mg/dL (ref 8.9–10.3)
Chloride: 106 mmol/L (ref 98–111)
Creatinine, Ser: 0.58 mg/dL (ref 0.44–1.00)
GFR calc Af Amer: >60 mL/min (ref >60)
GFR calc non Af Amer: >60 mL/min (ref >60)
Glucose, Bld: 97 mg/dL (ref 70–99)
Potassium: 3.9 mmol/L (ref 3.5–5.1)
Sodium: 136 mmol/L (ref 135–145)
Total Bilirubin: 1.1 mg/dL (ref 0.3–1.2)
Total Protein: 7.7 g/dL (ref 6.5–8.1)

## 2018-11-13 LAB — CBC
HCT: 40.4 % (ref 36.0–46.0)
Hemoglobin: 14.3 g/dL (ref 12.0–15.0)
MCH: 33.2 pg (ref 26.0–34.0)
MCHC: 35.4 g/dL (ref 30.0–36.0)
MCV: 93.7 fL (ref 80.0–100.0)
Platelets: 240 10*3/uL (ref 150–400)
RBC: 4.31 MIL/uL (ref 3.87–5.11)
RDW: 11.1 % — ABNORMAL LOW (ref 11.5–15.5)
WBC: 9.2 10*3/uL (ref 4.0–10.5)
nRBC: 0 % (ref 0.0–0.2)

## 2018-11-13 LAB — URINALYSIS, COMPLETE (UACMP) WITH MICROSCOPIC
Bacteria, UA: NONE SEEN
Bilirubin Urine: NEGATIVE
Glucose, UA: NEGATIVE mg/dL
Ketones, ur: NEGATIVE mg/dL
Leukocytes, UA: NEGATIVE
Nitrite: NEGATIVE
Protein, ur: NEGATIVE mg/dL
Specific Gravity, Urine: 1.002 — ABNORMAL LOW (ref 1.005–1.030)
pH: 6 (ref 5.0–8.0)

## 2018-11-13 LAB — POCT PREGNANCY, URINE: Preg Test, Ur: NEGATIVE

## 2018-11-13 LAB — LIPASE, BLOOD: Lipase: 21 U/L (ref 11–51)

## 2018-11-13 MED ORDER — BUPRENORPHINE HCL-NALOXONE HCL 8-2 MG SL SUBL
1.0000 | SUBLINGUAL_TABLET | Freq: Every day | SUBLINGUAL | Status: DC
  Administered 2018-11-13: 1 via SUBLINGUAL
  Filled 2018-11-13: qty 1

## 2018-11-13 MED ORDER — SODIUM CHLORIDE 0.9% FLUSH: 3.0000 mL | Freq: Once | INTRAVENOUS | Status: DC

## 2018-11-13 NOTE — ED Provider Notes (Signed)
[facility] Emergency Department Provider Note __   First MD Initiated Contact with Patient 11/13/18 1441     (approximate)  I have reviewed the triage vital signs and the nursing notes.   HISTORY  Chief Complaint Abdominal Pain and Emesis    HPI Hannah Shaw is a 23 y.o. female below list of chronic medical conditions including chronic pain for which the patient states that she is seen in [facility] presents to the emergency department with concerns for possible withdrawal.  Patient admits to pelvic discomfort and vomiting approximately 6 times today.  Patient denies any urinary symptoms.  Patient denies any fever no diarrhea or constipation.  Patient states that her last dose of Percocet/oxycodone was on Friday.  Patient states she was referred to the emergency department by her chronic pain physician.  Requesting referral to chronic pain physician here in [place].   Past Medical History:  Diagnosis Date  . Asthma    WELL CONTROLLED  . Chronic kidney disease    H/O STONES  . Endometriosis   . GERD (gastroesophageal reflux disease)   . Headache   . Hypothyroidism   . Thyroid disease     There are no active problems to display for this patient.   Past Surgical History:  Procedure Laterality Date  . APPENDECTOMY    . ENDOMETRIAL BIOPSY    . LAPAROSCOPY N/A 03/13/2016   Procedure: LAPAROSCOPY DIAGNOSTIC;  Surgeon: Honor Loh Ward, MD;  Location: [facility] ORS;  Service: Gynecology;  Laterality: N/A;    Prior to Admission medications   Medication Sig Start Date End Date Taking? Authorizing Provider  clonazePAM (KLONOPIN) 1 MG tablet Take 1 mg by mouth 2 (two) times daily as needed.    [provider]  desogestrel-ethinyl estradiol (MIRCETTE) 0.15-0.02/0.01 MG (21/5) tablet Take 1 tablet by mouth at bedtime. 06/17/18   Harlin Heys, MD  Elagolix Sodium (ORILISSA) 150 MG TABS Take 150 mg by mouth daily. 11/02/18 12/02/18   Harlin Heys, MD  FLUoxetine (PROZAC) 20 MG capsule Take 1 capsule by mouth daily. 08/27/16   [provider]  ibuprofen (ADVIL,MOTRIN) 600 MG tablet Take 1 tablet (600 mg total) by mouth every 6 (six) hours as needed. 12/29/16   Harvest Dark, MD  levothyroxine (SYNTHROID, LEVOTHROID) 25 MCG tablet Take 25 mcg by mouth daily before breakfast.     [provider]  meloxicam (MOBIC) 15 MG tablet Take 1 tablet (15 mg total) by mouth daily. 08/04/18   Cuthriell, Charline Bills, PA-C  methocarbamol (ROBAXIN) 500 MG tablet Take 1 tablet (500 mg total) by mouth 4 (four) times daily. 08/04/18   Cuthriell, Charline Bills, PA-C  norethindrone (MICRONOR,CAMILA,ERRIN) 0.35 MG tablet Take 1 tablet (0.35 mg total) by mouth daily. 11/02/18   Harlin Heys, MD  ondansetron (ZOFRAN ODT) 4 MG disintegrating tablet Take 1 tablet (4 mg total) by mouth every 8 (eight) hours as needed for nausea or vomiting. 06/29/18   Harvest Dark, MD  oxyCODONE-acetaminophen (PERCOCET) 7.5-325 MG tablet Take 1 tablet by mouth 2 (two) times daily. 09/24/16   [provider]    Allergies Morphine and related; Fentanyl; Betadine [povidone iodine]; and Iodine  Family History  Problem Relation Age of Onset  . Cervical cancer Mother   . Lung cancer Paternal Uncle     Social History Social History   Tobacco Use  . Smoking status: Current Every Day Smoker    Packs/day: 0.25    Types: Cigarettes  .  Smokeless tobacco: Never Used  Substance Use Topics  . Alcohol use: No  . Drug use: No    Review of Systems Constitutional: No fever/chills Eyes: No visual changes. ENT: No sore throat. Cardiovascular: Denies chest pain. Respiratory: Denies shortness of breath. Gastrointestinal: Positive for pelvic discomfort.  No nausea, no vomiting.  No diarrhea.  No constipation. Genitourinary: Negative for dysuria. Musculoskeletal: Negative for neck pain.  Negative for back pain. Integumentary: Negative  for rash. Neurological: Negative for headaches, focal weakness or numbness.  ____________________________________________   PHYSICAL EXAM:  VITAL SIGNS: ED Triage Vitals  Enc Vitals Group     BP 11/13/18 1332 110/62     Pulse Rate 11/13/18 1332 93     Resp 11/13/18 1332 16     Temp 11/13/18 1332 99.7 F (37.6 C)     Temp Source 11/13/18 1332 Oral     SpO2 11/13/18 1332 100 %     Weight --      Height --      Head Circumference --      Peak Flow --      Pain Score 11/13/18 1333 10     Pain Loc --      Pain Edu? --      Excl. in [place]? --     Constitutional: Alert and oriented. Well appearing and in no acute distress. Eyes: Conjunctivae are normal.  Mouth/Throat: Mucous membranes are moist.  Oropharynx non-erythematous. Neck: No stridor.   Cardiovascular: Normal rate, regular rhythm. Good peripheral circulation. Grossly normal heart sounds. Respiratory: Normal respiratory effort.  No retractions. Lungs CTAB. Gastrointestinal: Soft and nontender. No distention.  Musculoskeletal: No lower extremity tenderness nor edema. No gross deformities of extremities. Neurologic:  Normal speech and language. No gross focal neurologic deficits are appreciated.  Skin:  Skin is warm, dry and intact. No rash noted. Psychiatric: Mood and affect are normal. Speech and behavior are normal.  ____________________________________________   LABS (all labs ordered are listed, but only abnormal results are displayed)  Labs Reviewed  CBC - Abnormal; Notable for the following components:      Result Value   RDW 11.1 (*)    All other components within normal limits  LIPASE, BLOOD  COMPREHENSIVE METABOLIC PANEL  URINALYSIS, COMPLETE (UACMP) WITH MICROSCOPIC  POC URINE PREG, ED     Procedures   ____________________________________________   INITIAL IMPRESSION / ASSESSMENT AND PLAN / ED COURSE  As part of my medical decision making, I reviewed the following data within the electronic  MEDICAL RECORD NUMBER   23 year old female presenting with history and physical exam secondary to opiate withdrawal.  Patient given Suboxone in the emergency department referred to chronic pain physician for further outpatient management.    ____________________________________________  FINAL CLINICAL IMPRESSION(S) / ED DIAGNOSES  Final diagnoses:  Opiate withdrawal ([place])  Other chronic pain     MEDICATIONS GIVEN DURING THIS VISIT:  Medications  sodium chloride flush (NS) 0.9 % injection 3 mL (has no administration in time range)  buprenorphine-naloxone (SUBOXONE) 8-2 mg per SL tablet 1 tablet (has no administration in time range)     ED Discharge Orders    None       Note:  This document was prepared using Dragon voice recognition software and may include unintentional dictation errors.    Gregor Hams, MD 11/13/18 [phone]

## 2018-11-13 NOTE — ED Triage Notes (Signed)
Pt to ED with c/o of abdominal pain and vomiting that started yesterday. Per pt's mother pt has had 6 occurrences since yesterday.

## 2018-11-14 NOTE — ED Notes (Signed)
Patient called for referral to pain clinic.  [place]  [place] explained that pcp needs to do the referral if she is staying near here.  I told her to call pcp and ask how they will handle this.  She says she cannot go so far for appt each month.  I told her that if she is going to a pain clinic down there, they will need to be involved as well.   I also explained that becoming a patient here will most likely take weeks, and that she may need to go to her pcp in the mean time.

## 2018-11-30 ENCOUNTER — Encounter: Payer: Medicare Other | Admitting: Obstetrics and Gynecology

## 2018-12-01 ENCOUNTER — Encounter: Payer: Self-pay | Admitting: Obstetrics and Gynecology

## 2018-12-01 ENCOUNTER — Ambulatory Visit (INDEPENDENT_AMBULATORY_CARE_PROVIDER_SITE_OTHER): Payer: Medicare Other | Admitting: Obstetrics and Gynecology

## 2018-12-01 VITALS — BP 101/71 | HR 105 | Ht 62.0 in | Wt 96.1 lb

## 2018-12-01 DIAGNOSIS — R102 Pelvic and perineal pain: Secondary | ICD-10-CM

## 2018-12-01 DIAGNOSIS — N809 Endometriosis, unspecified: Secondary | ICD-10-CM | POA: Diagnosis not present

## 2018-12-01 MED ORDER — ELAGOLIX SODIUM 150 MG PO TABS: 150.0000 mg | ORAL_TABLET | Freq: Every day | ORAL | 1 refills | Status: AC

## 2018-12-01 NOTE — Progress Notes (Signed)
HPI:      [name] is a 23 y.o. G1P0010 who LMP was Patient's last menstrual period was 11/14/2018.  Subjective:   She presents today for follow-up for endometriosis.  She presents with her father who does all of the talking for her.  He states that she has had bleeding on and off this whole month.  He does report that she is taking her Micronor as directed.  He states that she has been seeing multiple other doctors for her pelvic pain and was recently prescribed 7.5 mg of oxycodone for her pain.  It is difficult to get a sense of how she is doing on [place] but certainly if she is needing narcotics does not appear to be successful. Also of significant note she complains of constipation. The patient's father states that he thinks she needs a hysterectomy as one of her other doctors told her that it was a "severe danger to her and her baby if she should get pregnant".    Hx: The following portions of the patient's history were reviewed and updated as appropriate:             She  has a past medical history of Asthma, Chronic kidney disease, Endometriosis, GERD (gastroesophageal reflux disease), Headache, Hypothyroidism, and Thyroid disease. She does not have any pertinent problems on file. She  has a past surgical history that includes Appendectomy; Endometrial biopsy; and laparoscopy (N/A, 03/13/2016). Her family history includes Cervical cancer in her mother; Lung cancer in her paternal uncle. She  reports that she has been smoking cigarettes. She has been smoking about 0.25 packs per day. She has never used smokeless tobacco. She reports that she does not drink alcohol or use drugs. She has a current medication list which includes the following prescription(s): clonazepam, elagolix sodium, ibuprofen, levothyroxine, ondansetron, oxycodone-acetaminophen, desogestrel-ethinyl estradiol, fluoxetine, meloxicam, methocarbamol, and norethindrone. She is allergic to morphine and related;  fentanyl; betadine [povidone iodine]; and iodine.       Review of Systems:  Review of Systems  Constitutional: Denied constitutional symptoms, night sweats, recent illness, fatigue, fever, insomnia and weight loss.  Eyes: Denied eye symptoms, eye pain, photophobia, vision change and visual disturbance.  Ears/Nose/Throat/Neck: Denied ear, nose, throat or neck symptoms, hearing loss, nasal discharge, sinus congestion and sore throat.  Cardiovascular: Denied cardiovascular symptoms, arrhythmia, chest pain/pressure, edema, exercise intolerance, orthopnea and palpitations.  Respiratory: Denied pulmonary symptoms, asthma, pleuritic pain, productive sputum, cough, dyspnea and wheezing.  Gastrointestinal: Denied, gastro-esophageal reflux, melena, nausea and vomiting.  Genitourinary: See HPI for additional information.  Musculoskeletal: Denied musculoskeletal symptoms, stiffness, swelling, muscle weakness and myalgia.  Dermatologic: Denied dermatology symptoms, rash and scar.  Neurologic: Denied neurology symptoms, dizziness, headache, neck pain and syncope.  Psychiatric: Denied psychiatric symptoms, anxiety and depression.  Endocrine: Denied endocrine symptoms including hot flashes and night sweats.   Meds:   Current Outpatient Medications on File Prior to Visit  Medication Sig Dispense Refill  . clonazePAM (KLONOPIN) 1 MG tablet Take 1 mg by mouth 2 (two) times daily as needed.    . Elagolix Sodium (ORILISSA) 150 MG TABS Take 150 mg by mouth daily. 30 tablet 1  . ibuprofen (ADVIL,MOTRIN) 600 MG tablet Take 1 tablet (600 mg total) by mouth every 6 (six) hours as needed. 30 tablet 0  . levothyroxine (SYNTHROID, LEVOTHROID) 25 MCG tablet Take 25 mcg by mouth daily before breakfast.     . ondansetron (ZOFRAN ODT) 4 MG disintegrating tablet Take 1 tablet (4  mg total) by mouth every 8 (eight) hours as needed for nausea or vomiting. 20 tablet 0  . oxyCODONE-acetaminophen (PERCOCET) 7.5-325 MG tablet  Take 1 tablet by mouth 2 (two) times daily.    Marland Kitchen desogestrel-ethinyl estradiol (MIRCETTE) 0.15-0.02/0.01 MG (21/5) tablet Take 1 tablet by mouth at bedtime. (Patient not taking: Reported on 12/01/2018) 1 Package 2  . FLUoxetine (PROZAC) 20 MG capsule Take 1 capsule by mouth daily.    . meloxicam (MOBIC) 15 MG tablet Take 1 tablet (15 mg total) by mouth daily. (Patient not taking: Reported on 12/01/2018) 30 tablet 0  . methocarbamol (ROBAXIN) 500 MG tablet Take 1 tablet (500 mg total) by mouth 4 (four) times daily. (Patient not taking: Reported on 12/01/2018) 16 tablet 0  . norethindrone (MICRONOR,CAMILA,ERRIN) 0.35 MG tablet Take 1 tablet (0.35 mg total) by mouth daily. 1 Package 11   No current facility-administered medications on file prior to visit.     Objective:     Vitals:   12/01/18 1138  BP: 101/71  Pulse: (!) 105                Assessment:    G1P0010 There are no active problems to display for this patient.    1. Pelvic pain in female   2. Endometriosis determined by laparoscopy     Patient not taking Freida Busman and Micronor   Plan:            1.  1 more month of Orilissa and Micronor.  Expect significant improvement in pelvic pain.  If not we will consider this Freida Busman use a failure for her endometriosis. Orders No orders of the defined types were placed in this encounter.   No orders of the defined types were placed in this encounter.     F/U  Return in about 4 weeks (around 12/29/2018). I spent 17 minutes involved in the care of this patient of which greater than 50% was spent discussing endometriosis, pain, bleeding, expectations of Orilissa, constipation and oxycodone.  All questions answered  Finis Bud, M.D. 12/01/2018 11:57 AM

## 2018-12-20 ENCOUNTER — Ambulatory Visit (INDEPENDENT_AMBULATORY_CARE_PROVIDER_SITE_OTHER): Payer: Medicare Other | Admitting: Obstetrics and Gynecology

## 2018-12-20 ENCOUNTER — Encounter: Payer: Self-pay | Admitting: Obstetrics and Gynecology

## 2018-12-20 ENCOUNTER — Encounter: Payer: Medicare Other | Admitting: Obstetrics and Gynecology

## 2018-12-20 DIAGNOSIS — R102 Pelvic and perineal pain: Secondary | ICD-10-CM

## 2018-12-20 DIAGNOSIS — N809 Endometriosis, unspecified: Secondary | ICD-10-CM

## 2018-12-20 DIAGNOSIS — Z30011 Encounter for initial prescription of contraceptive pills: Secondary | ICD-10-CM

## 2018-12-20 DIAGNOSIS — N920 Excessive and frequent menstruation with regular cycle: Secondary | ICD-10-CM | POA: Diagnosis not present

## 2018-12-20 MED ORDER — DESOGESTREL-ETHINYL ESTRADIOL 0.15-0.02/0.01 MG (21/5) PO TABS: 1.0000 | ORAL_TABLET | Freq: Every day | ORAL | 6 refills | Status: DC

## 2018-12-20 MED ORDER — IBUPROFEN 600 MG PO TABS: 600.0000 mg | ORAL_TABLET | Freq: Four times a day (QID) | ORAL | 0 refills | Status: DC | PRN

## 2018-12-20 NOTE — Progress Notes (Signed)
Patient ID: Hannah Shaw, female   DOB: 1996/10/04, 22 y.o.   MRN: [id number]   [facility] Visit  @DATE @            Patient name: Hannah Shaw MRN [id number]  Date of birth: [birth year]  CC & HPI:  Hannah Shaw is a 23 y.o. female presenting today for endometriosis. She is accompanied by her father, father does most of communicating for her. Both surgeons who have operated on [name], told her father that "it was dangerous for her to get pregnant. Being pregnant could kill her and the baby". Hannah Shaw still lives at home with her parents. Has no children. G2P0, both early fetal demise. The first miscarriage she was at home and was roughly 4 months pregnant. Miscarriage occurred January 29 1552 at 23 years old. Had another miscarriage January 2020. She is still sexually active with no PCP.  Bleeding off and on bleeding. Father says she has stopped taking micronor due making her bleed even worse. Father is adamant about restarting Mircette and is agreeable to Dr.Ferg's suggestion to aid with spotting inbetween cycle Fathers says we can obtain records from [facility] in [place]  Recently lost paternal aunt to Stage 4 hodgkin's lymphoma.  ROS:  ROS   Pertinent History Reviewed:   Reviewed: Significant for endometriosis Medical         Past Medical History:  Diagnosis Date  . Asthma    WELL CONTROLLED  . Chronic kidney disease    H/O STONES  . Endometriosis   . GERD (gastroesophageal reflux disease)   . Headache   . Hypothyroidism   . Thyroid disease                               Surgical Hx:    Past Surgical History:  Procedure Laterality Date  . APPENDECTOMY    . ENDOMETRIAL BIOPSY    . LAPAROSCOPY N/A 03/13/2016   Procedure: LAPAROSCOPY DIAGNOSTIC;  Surgeon: Honor Loh Ward, MD;  Location: [facility] ORS;  Service: Gynecology;  Laterality: N/A;   Medications: Reviewed & Updated - see associated section                       Current Outpatient Medications:  .   clonazePAM (KLONOPIN) 1 MG tablet, Take 1 mg by mouth 2 (two) times daily as needed., Disp: , Rfl:  .  desogestrel-ethinyl estradiol (MIRCETTE) 0.15-0.02/0.01 MG (21/5) tablet, Take 1 tablet by mouth at bedtime. (Patient not taking: Reported on 12/01/2018), Disp: 1 Package, Rfl: 2 .  Elagolix Sodium (ORILISSA) 150 MG TABS, Take 150 mg by mouth daily for 30 days., Disp: 30 tablet, Rfl: 1 .  FLUoxetine (PROZAC) 20 MG capsule, Take 1 capsule by mouth daily., Disp: , Rfl:  .  ibuprofen (ADVIL,MOTRIN) 600 MG tablet, Take 1 tablet (600 mg total) by mouth every 6 (six) hours as needed., Disp: 30 tablet, Rfl: 0 .  levothyroxine (SYNTHROID, LEVOTHROID) 25 MCG tablet, Take 25 mcg by mouth daily before breakfast. , Disp: , Rfl:  .  meloxicam (MOBIC) 15 MG tablet, Take 1 tablet (15 mg total) by mouth daily. (Patient not taking: Reported on 12/01/2018), Disp: 30 tablet, Rfl: 0 .  methocarbamol (ROBAXIN) 500 MG tablet, Take 1 tablet (500 mg total) by mouth 4 (four) times daily. (Patient not taking: Reported on 12/01/2018), Disp: 16 tablet, Rfl: 0 .  norethindrone (MICRONOR,CAMILA,ERRIN) 0.35 MG tablet,  Take 1 tablet (0.35 mg total) by mouth daily., Disp: 1 Package, Rfl: 11 .  ondansetron (ZOFRAN ODT) 4 MG disintegrating tablet, Take 1 tablet (4 mg total) by mouth every 8 (eight) hours as needed for nausea or vomiting., Disp: 20 tablet, Rfl: 0 .  oxyCODONE-acetaminophen (PERCOCET) 7.5-325 MG tablet, Take 1 tablet by mouth 2 (two) times daily., Disp: , Rfl:    Social History: Reviewed -  reports that she has been smoking cigarettes. She has been smoking about 0.25 packs per day. She has never used smokeless tobacco.  Objective Findings:  Vitals: Height 5\' 2"  (1.575 m), weight 93 lb 9.6 oz (42.5 kg).  PHYSICAL EXAMINATION General appearance - alert, well appearing, shy and quiet and in no distress Mental status - alert, oriented to person, place, and time, normal mood, behavior, speech, dress, motor activity, and  thought processes, affect appropriate to mood   PELVIC NOT DONE  Assessment & Plan:   A:  1. Menorrhagia 2. endometriosis  P:  1. Rx Mircette 0.15 mg 2. Keep pain calender 3. F/u in 6 weeks    By signing my name below, I, Samul Dada, attest that this documentation has been prepared under the direction and in the presence of Jonnie Kind, MD. Electronically Signed: [place]. 12/20/18. 2:06 PM.  Jonnie Kind, MD I personally performed the services described in this documentation, which was SCRIBED in my presence. The recorded information has been reviewed and considered accurate. It has been edited as necessary during review. Jonnie Kind, MD

## 2019-01-17 DIAGNOSIS — R482 Apraxia: Secondary | ICD-10-CM | POA: Insufficient documentation

## 2019-01-17 DIAGNOSIS — F819 Developmental disorder of scholastic skills, unspecified: Secondary | ICD-10-CM | POA: Insufficient documentation

## 2019-01-17 DIAGNOSIS — E039 Hypothyroidism, unspecified: Secondary | ICD-10-CM | POA: Insufficient documentation

## 2019-01-23 ENCOUNTER — Telehealth: Payer: Self-pay | Admitting: Obstetrics and Gynecology

## 2019-01-23 ENCOUNTER — Telehealth: Payer: Self-pay | Admitting: *Deleted

## 2019-01-23 NOTE — Telephone Encounter (Signed)
Pt requests refill on her pain medication. Advised that I would send her request to a provider and she could check with the pharmacy later today. Pt and pts mother verbalized understanding.

## 2019-01-23 NOTE — Telephone Encounter (Signed)
Patient called, had her mother get on the phone to speak with me.  The mother is requesting a refill on her pain medication.  [place]  [phone]

## 2019-01-23 NOTE — Telephone Encounter (Signed)
Patient's mom called wanting to speak to the nurse. Please advise [phone]

## 2019-01-23 NOTE — Telephone Encounter (Signed)
Please forward this request to Dr Glo Herring, he will address it tomorrow when he is in the office

## 2019-01-23 NOTE — Telephone Encounter (Signed)
Pt mother informed that Dr Glo Herring was not in the office today but would be tomorrow. Advised that I sent the request for refill on pain medication to him. Advised to take tylenol or ibuprofen as long as no allergies to them to help with the pain until she could get the refill. Verbalized understanding.

## 2019-01-24 ENCOUNTER — Telehealth: Payer: Self-pay | Admitting: Obstetrics and Gynecology

## 2019-01-24 ENCOUNTER — Telehealth: Payer: Self-pay | Admitting: *Deleted

## 2019-01-24 NOTE — Telephone Encounter (Signed)
Dr Glo Herring attempted to call patient regarding pain medication. Voicemail was left on number provided.

## 2019-01-24 NOTE — Telephone Encounter (Signed)
Unable to reach pt at home phone number

## 2019-01-24 NOTE — Telephone Encounter (Signed)
Pts mom states that they have no more minutes on their phone and the office may not be able to reach her regarding medication. Alternate number she can be reached at is [phone]

## 2019-01-24 NOTE — Telephone Encounter (Signed)
Mother phone number "unavailable" as well.

## 2019-01-24 NOTE — Telephone Encounter (Signed)
Unable to contact pt x 3 devices.  Will await contact from pt.

## 2019-01-24 NOTE — Telephone Encounter (Signed)
Unable to reach the patient, the mother, or the third phone number listed on a followup call. No one has minutes.

## 2019-01-26 ENCOUNTER — Telehealth: Payer: Self-pay | Admitting: Obstetrics and Gynecology

## 2019-01-26 MED ORDER — OXYCODONE-ACETAMINOPHEN 7.5-325 MG PO TABS: 1.0000 | ORAL_TABLET | Freq: Four times a day (QID) | ORAL | 0 refills | Status: DC | PRN

## 2019-01-26 NOTE — Telephone Encounter (Signed)
Pt reports that she "needs " her pain meds,  Refil of Oxycodone 7.5/325 x 30 tabs Rx'd   Pt lives in [place]. Pt advised that she should come by, sign ROI for surgical records of Laparoscopy. Pt informed that I must be able to review operation records to continue care. Pt to make appt.person to person

## 2019-01-26 NOTE — Telephone Encounter (Signed)
Patient called stating that she would like a refill of her pain medication. Please contact pt when done.

## 2019-01-26 NOTE — Telephone Encounter (Signed)
Please call pt and advise to come by to sign ROI for surgery, Laparoscopy.

## 2019-01-30 ENCOUNTER — Telehealth: Payer: Self-pay | Admitting: *Deleted

## 2019-01-30 NOTE — Telephone Encounter (Signed)
Pt is not able to webex. Would like to keep in person visit with Dr. Glo Herring. Patient informed that we are not allowing visitors or children to come to appointments at this time. Patient denies any contact with anyone suspected or confirmed of having COVID-19. Pt denies fever, cough, sob, muscle pain, diarrhea, Memori Sammon, vomiting, abdominal pain, red eye, weakness, bruising or bleeding, joint pain or severe headache.

## 2019-01-31 ENCOUNTER — Other Ambulatory Visit: Payer: Self-pay

## 2019-01-31 ENCOUNTER — Ambulatory Visit: Payer: Self-pay | Admitting: Obstetrics and Gynecology

## 2019-01-31 ENCOUNTER — Telehealth: Payer: Self-pay | Admitting: Obstetrics and Gynecology

## 2019-01-31 ENCOUNTER — Ambulatory Visit: Payer: Medicare Other | Admitting: Obstetrics and Gynecology

## 2019-01-31 DIAGNOSIS — Z3049 Encounter for surveillance of other contraceptives: Secondary | ICD-10-CM

## 2019-01-31 DIAGNOSIS — N809 Endometriosis, unspecified: Secondary | ICD-10-CM

## 2019-01-31 NOTE — Telephone Encounter (Signed)
01/31/2019 at 10:32 AM Phone call in coming from [name].  She states she will be unable to keep her appointment today, and is unable to download the WebEx app to allow [place] to have a telephone visit. Patient reports she is taking her oral contraceptives and taking it regularly at the same time each day.  She states that she has a period twice a month.  She does not mention pain.  She describes her menstrual bleeding as "gushing" the patient does not make it clear how many days she has bleeding per month, so I have asked her to keep a record of her menstrual flow how heavy it is and how many days over the next 6 weeks and then we will follow-up in 2 months. Additionally the patient signed for request of records at [facility] in [place] yesterday so we should be receiving those faxed records including her operative note in the near future Follow-up in 2 months

## 2019-01-31 NOTE — Progress Notes (Signed)
Addendum to office visit January 31, 2019 I reviewed the operative report for the patient's laparoscopic procedure 08/23/2013 at which time her OB/GYN in [place], [place] found small areas of endometriosis on the uterosacral ligaments bilaterally which were removed, and a small bit of cul-de-sac endometriosis that was ablated with harmonic scalpel and Kleppinger forceps Patient was placed on continuous oral contraceptives after that procedure  The review of patient's records shows that she had was on Lupron for approximately 6 months during 2015.  The patient had had generous use of opiates prior to the surgery and until mid 2015 when Dr. Ouida Sills refused to continue additional opiates

## 2019-02-13 ENCOUNTER — Ambulatory Visit: Payer: Self-pay | Admitting: Obstetrics and Gynecology

## 2019-02-16 ENCOUNTER — Telehealth: Payer: Self-pay | Admitting: Obstetrics and Gynecology

## 2019-02-16 MED ORDER — ELAGOLIX SODIUM 200 MG PO TABS: 200.0000 mg | ORAL_TABLET | Freq: Two times a day (BID) | ORAL | 11 refills | Status: DC

## 2019-02-16 NOTE — Telephone Encounter (Signed)
elagolix (orlissa) 200 mg escribed. May need prior approval.

## 2019-02-16 NOTE — Telephone Encounter (Signed)
Phone call to patient , and spoke to pt's mom on same call.  Pt has laparoscopically proven endometriosis, and will be placed on Orlissa 200 mg bid.  Pt to stay on OCP's.

## 2019-02-21 ENCOUNTER — Encounter: Payer: Self-pay | Admitting: Emergency Medicine

## 2019-02-21 ENCOUNTER — Emergency Department
Admission: EM | Admit: 2019-02-21 | Discharge: 2019-02-21 | Disposition: A | Discharge status: Home / Self Care | Payer: Medicare Other | Attending: Emergency Medicine | Admitting: Emergency Medicine

## 2019-02-21 ENCOUNTER — Other Ambulatory Visit: Payer: Self-pay

## 2019-02-21 DIAGNOSIS — E039 Hypothyroidism, unspecified: Secondary | ICD-10-CM | POA: Diagnosis not present

## 2019-02-21 DIAGNOSIS — F17210 Nicotine dependence, cigarettes, uncomplicated: Secondary | ICD-10-CM | POA: Diagnosis not present

## 2019-02-21 DIAGNOSIS — J45909 Unspecified asthma, uncomplicated: Secondary | ICD-10-CM | POA: Insufficient documentation

## 2019-02-21 DIAGNOSIS — Z8742 Personal history of other diseases of the female genital tract: Secondary | ICD-10-CM | POA: Diagnosis not present

## 2019-02-21 DIAGNOSIS — R1030 Lower abdominal pain, unspecified: Secondary | ICD-10-CM | POA: Diagnosis present

## 2019-02-21 DIAGNOSIS — Z79899 Other long term (current) drug therapy: Secondary | ICD-10-CM | POA: Diagnosis not present

## 2019-02-21 LAB — COMPREHENSIVE METABOLIC PANEL
ALT: 13 U/L (ref 0–44)
AST: 17 U/L (ref 15–41)
Albumin: 4.5 g/dL (ref 3.5–5.0)
Alkaline Phosphatase: 44 U/L (ref 38–126)
Anion gap: 10 (ref 5–15)
BUN: 8 mg/dL (ref 6–20)
CO2: 22 mmol/L (ref 22–32)
Calcium: 8.9 mg/dL (ref 8.9–10.3)
Chloride: 105 mmol/L (ref 98–111)
Creatinine, Ser: 0.47 mg/dL (ref 0.44–1.00)
GFR calc Af Amer: >60 mL/min (ref >60)
GFR calc non Af Amer: >60 mL/min (ref >60)
Glucose, Bld: 94 mg/dL (ref 70–99)
Potassium: 3.8 mmol/L (ref 3.5–5.1)
Sodium: 137 mmol/L (ref 135–145)
Total Bilirubin: 1.1 mg/dL (ref 0.3–1.2)
Total Protein: 7.4 g/dL (ref 6.5–8.1)

## 2019-02-21 LAB — CBC
HCT: 41.4 % (ref 36.0–46.0)
Hemoglobin: 14.6 g/dL (ref 12.0–15.0)
MCH: 33.3 pg (ref 26.0–34.0)
MCHC: 35.3 g/dL (ref 30.0–36.0)
MCV: 94.3 fL (ref 80.0–100.0)
Platelets: 218 10*3/uL (ref 150–400)
RBC: 4.39 MIL/uL (ref 3.87–5.11)
RDW: 11.2 % — ABNORMAL LOW (ref 11.5–15.5)
WBC: 6.8 10*3/uL (ref 4.0–10.5)
nRBC: 0 % (ref 0.0–0.2)

## 2019-02-21 LAB — POCT PREGNANCY, URINE: Preg Test, Ur: NEGATIVE

## 2019-02-21 LAB — URINALYSIS, COMPLETE (UACMP) WITH MICROSCOPIC
Bilirubin Urine: NEGATIVE
Glucose, UA: NEGATIVE mg/dL
Ketones, ur: NEGATIVE mg/dL
Nitrite: NEGATIVE
Protein, ur: NEGATIVE mg/dL
Specific Gravity, Urine: 1.01 (ref 1.005–1.030)
pH: 7 (ref 5.0–8.0)

## 2019-02-21 LAB — LIPASE, BLOOD: Lipase: 26 U/L (ref 11–51)

## 2019-02-21 MED ORDER — KETOROLAC TROMETHAMINE 10 MG PO TABS: 10.0000 mg | ORAL_TABLET | Freq: Three times a day (TID) | ORAL | 0 refills | Status: DC

## 2019-02-21 MED ORDER — KETOROLAC TROMETHAMINE 30 MG/ML IJ SOLN
30.0000 mg | Freq: Once | INTRAMUSCULAR | Status: AC
  Administered 2019-02-21: 30 mg via INTRAVENOUS
  Filled 2019-02-21: qty 1

## 2019-02-21 MED ORDER — ONDANSETRON HCL 4 MG/2ML IJ SOLN
4.0000 mg | Freq: Once | INTRAMUSCULAR | Status: AC
  Administered 2019-02-21: 4 mg via INTRAVENOUS
  Filled 2019-02-21: qty 2

## 2019-02-21 MED ORDER — SODIUM CHLORIDE 0.9% FLUSH: 3.0000 mL | Freq: Once | INTRAVENOUS | Status: DC

## 2019-02-21 MED ORDER — ONDANSETRON 4 MG PO TBDP: 4.0000 mg | ORAL_TABLET | Freq: Three times a day (TID) | ORAL | 0 refills | Status: DC | PRN

## 2019-02-21 NOTE — ED Provider Notes (Signed)
[facility] Emergency Department Provider Note ____________________________________________  Time seen: 1230  I have reviewed the triage vital signs and the nursing notes.  HISTORY  Chief Complaint  Abdominal Pain  HPI Hannah Shaw is a 23 y.o. female presents herself to the ED for complaints of stabbing lower abdominal pain that started last night.  Patient denies any fevers, chills, sweats, nausea, or diarrhea.  She does admit to a several (9) episodes of nonbloody, nonbilious vomitus last night.  She also reports a Tmax of 102 deg F, with onset of her abdominal pain last night. She took Tylenol last night and this morning. She reports her current pain is "11 or 12" out of ten. [As she nonchalantly texts on her phone]. She reports that her LMP was last week. She denies abnormal bleeding, clots, or vaginal discharge. She has a history of endometriosis and claims this feel similar.  Reports a period every 2 weeks consistent with her history of endometriosis.  Past Medical History:  Diagnosis Date  . Asthma    WELL CONTROLLED  . Chronic kidney disease    H/O STONES  . Endometriosis   . GERD (gastroesophageal reflux disease)   . Headache   . Hypothyroidism   . Thyroid disease     There are no active problems to display for this patient.   Past Surgical History:  Procedure Laterality Date  . APPENDECTOMY    . ENDOMETRIAL BIOPSY    . LAPAROSCOPY N/A 03/13/2016   Procedure: LAPAROSCOPY DIAGNOSTIC;  Surgeon: Honor Loh Ward, MD;  Location: [facility] ORS;  Service: Gynecology;  Laterality: N/A;    Prior to Admission medications   Medication Sig Start Date End Date Taking? Authorizing Provider  clonazePAM (KLONOPIN) 1 MG tablet Take 1 mg by mouth 2 (two) times daily as needed.    [provider]  desogestrel-ethinyl estradiol (MIRCETTE) 0.15-0.02/0.01 MG (21/5) tablet Take 1 tablet by mouth at bedtime. 12/20/18   Jonnie Kind, MD  Elagolix Sodium 200 MG  TABS Take 200 mg by mouth 2 (two) times a day. 02/16/19   Jonnie Kind, MD  FLUoxetine (PROZAC) 20 MG capsule Take 1 capsule by mouth daily. 08/27/16   [provider]  ibuprofen (ADVIL,MOTRIN) 600 MG tablet Take 1 tablet (600 mg total) by mouth every 6 (six) hours as needed. 12/20/18   Jonnie Kind, MD  levothyroxine (SYNTHROID, LEVOTHROID) 25 MCG tablet Take 25 mcg by mouth daily before breakfast.     [provider]  meloxicam (MOBIC) 15 MG tablet Take 1 tablet (15 mg total) by mouth daily. Patient not taking: Reported on 12/01/2018 08/04/18   Cuthriell, Charline Bills, PA-C  methocarbamol (ROBAXIN) 500 MG tablet Take 1 tablet (500 mg total) by mouth 4 (four) times daily. Patient not taking: Reported on 12/01/2018 08/04/18   Cuthriell, Charline Bills, PA-C  norethindrone (MICRONOR,CAMILA,ERRIN) 0.35 MG tablet Take 1 tablet (0.35 mg total) by mouth daily. Patient not taking: Reported on 12/20/2018 11/02/18   Harlin Heys, MD  ondansetron (ZOFRAN ODT) 4 MG disintegrating tablet Take 1 tablet (4 mg total) by mouth every 8 (eight) hours as needed for nausea or vomiting. Patient not taking: Reported on 12/20/2018 06/29/18   Harvest Dark, MD  oxyCODONE-acetaminophen (PERCOCET) 7.5-325 MG tablet Take 1 tablet by mouth every 6 (six) hours as needed for severe pain. 01/26/19   Jonnie Kind, MD    Allergies Morphine and related; Fentanyl; Betadine [povidone iodine]; and Iodine  Family History  Problem Relation Age of Onset  . Cervical cancer Mother   . Lung cancer Paternal Uncle     Social History Social History   Tobacco Use  . Smoking status: Current Every Day Smoker    Packs/day: 0.25    Types: Cigarettes  . Smokeless tobacco: Never Used  Substance Use Topics  . Alcohol use: No  . Drug use: No    Review of Systems  Constitutional: Negative for fever. Cardiovascular: Negative for chest pain. Respiratory: Negative for shortness of breath. Gastrointestinal:  Reports lower abdominal pain. Reports resolved vomiting. Denies diarrhea. Genitourinary: Negative for dysuria. Musculoskeletal: Negative for back pain. Skin: Negative for rash. Neurological: Negative for headaches, focal weakness or numbness. ____________________________________________  PHYSICAL EXAM:  VITAL SIGNS: ED Triage Vitals [02/21/19 1154]  Enc Vitals Group     BP 121/72     Pulse Rate 95     Resp 18     Temp 99.3 F (37.4 C)     Temp Source Oral     SpO2 100 %     Weight 95 lb (43.1 kg)     Height 5\' 2"  (1.575 m)     Head Circumference      Peak Flow      Pain Score 10     Pain Loc      Pain Edu?      Excl. in [place]?     Constitutional: Alert and oriented. Well appearing and in no distress. Head: Normocephalic and atraumatic. Eyes: Conjunctivae are normal. Normal extraocular movements Cardiovascular: Normal rate, regular rhythm. Normal distal pulses. Respiratory: Normal respiratory effort. No wheezes/rales/rhonchi. Gastrointestinal: Soft, flat, and nontender. No distention.  Normal bowel sounds without rebound, guarding, rigidity, or organomegaly.  Normal bowel sounds noted x4.  No CVA tenderness elicited. Musculoskeletal: Nontender with normal range of motion in all extremities.  Neurologic:  Normal gait without ataxia. Normal speech and language. No gross focal neurologic deficits are appreciated. Skin:  Skin is warm, dry and intact. No rash noted. Psychiatric: Mood and affect are normal. Patient exhibits appropriate insight and judgment. ____________________________________________   LABS (pertinent positives/negatives) Labs Reviewed  CBC - Abnormal; Notable for the following components:      Result Value   RDW 11.2 (*)    All other components within normal limits  URINALYSIS, COMPLETE (UACMP) WITH MICROSCOPIC - Abnormal; Notable for the following components:   Color, Urine YELLOW (*)    APPearance HAZY (*)    Hgb urine dipstick MODERATE (*)     Leukocytes,Ua TRACE (*)    Bacteria, UA RARE (*)    All other components within normal limits  LIPASE, BLOOD  COMPREHENSIVE METABOLIC PANEL  POC URINE PREG, ED  POCT PREGNANCY, URINE  ____________________________________________  PROCEDURES  Procedures Toradol 30 mg IVP Zofran 4 mg IVP ____________________________________________  INITIAL IMPRESSION / ASSESSMENT AND PLAN / ED COURSE  Patient with ED evaluation of lower abdominal pain with onset last night.  She had several episodes of nonbloody, nonbilious vomitus as well.  She presents today with symptoms of vomiting resolved and continued complaints of low abdominal pain.  History is consistent with endometriosis although the patient had a normal LMP last week.  Her exam at this time is benign and reassuring as she has no acute abdominal process, she is stable, and has no signs of acute dehydration.  She will be discharged to follow with her GYN provider for ongoing management.  She will continue with her daily oral contraceptives as prescribed.  Prescription for Zofran and Toradol is provided at this time.  Return precautions have been reviewed.  I reviewed the patient's prescription history over the last 12 months in the multi-state controlled substances database(s) that includes [place], [place], [place], [place], [place], [place], [place], [place], [place], [place], [place], [place], [place], and [place].  Results were notable for recent controlled substance prescription.  ____________________________________________  FINAL CLINICAL IMPRESSION(S) / ED DIAGNOSES  Final diagnoses:  Lower abdominal pain  H/O endometritis      Carmie End, Dannielle Karvonen, PA-C 02/21/19 1327    Lavonia Drafts, MD 02/21/19 1332

## 2019-02-21 NOTE — ED Triage Notes (Signed)
Pt states it feels like a knife is stabbing her lower abd ever since last night. Denies diarrhea, did vomit last night. Had period last week, states she has a hx of endometriosis and the pain is consistent with that. NAD.

## 2019-02-21 NOTE — ED Notes (Signed)
Pt took 2 ibuprofen at 0800 this am. Very low grade fever noted.

## 2019-02-21 NOTE — Discharge Instructions (Signed)
Your exam and labs are normal at this time. Take the prescription meds as directed. Follow-up with your GYN provider for ongoing symptoms. Take your previously prescribed pain meds as directed.

## 2019-02-22 ENCOUNTER — Telehealth: Payer: Self-pay | Admitting: Obstetrics and Gynecology

## 2019-02-22 NOTE — Telephone Encounter (Signed)
Patient called, stated she went to [facility] yesterday for stomach pain.  She is wanting pain medication.  [facility] [employer]  [phone]

## 2019-02-23 ENCOUNTER — Other Ambulatory Visit: Payer: Self-pay | Admitting: Obstetrics & Gynecology

## 2019-02-23 NOTE — Telephone Encounter (Signed)
Patient called back. I informed her that we would not be refilling any narcotic pain medication. She stated that she fell today and is hurting. I advised that if she has fallen and injured herself she would need to go be evaluated. Patient then hung phone up.

## 2019-02-23 NOTE — Telephone Encounter (Signed)
Attempted to call patient back heard message that vm is not set up.  

## 2019-02-23 NOTE — Telephone Encounter (Signed)
   ED Note and previous notes and encounters reviewed  We will not be managing this with chronic opiate therapy, she will need to contact Dr Glo Herring when he returns for his management plan but it is clear chronic narcotic therapy is not indicated in this patient

## 2019-03-05 ENCOUNTER — Emergency Department: Payer: Medicare Other

## 2019-03-05 ENCOUNTER — Encounter: Payer: Self-pay | Admitting: Emergency Medicine

## 2019-03-05 ENCOUNTER — Other Ambulatory Visit: Payer: Self-pay

## 2019-03-05 ENCOUNTER — Emergency Department
Admission: EM | Admit: 2019-03-05 | Discharge: 2019-03-05 | Disposition: A | Discharge status: Home / Self Care | Payer: Medicare Other | Attending: Emergency Medicine | Admitting: Emergency Medicine

## 2019-03-05 DIAGNOSIS — F17210 Nicotine dependence, cigarettes, uncomplicated: Secondary | ICD-10-CM | POA: Insufficient documentation

## 2019-03-05 DIAGNOSIS — R102 Pelvic and perineal pain: Secondary | ICD-10-CM

## 2019-03-05 DIAGNOSIS — N189 Chronic kidney disease, unspecified: Secondary | ICD-10-CM | POA: Diagnosis not present

## 2019-03-05 DIAGNOSIS — J45909 Unspecified asthma, uncomplicated: Secondary | ICD-10-CM | POA: Insufficient documentation

## 2019-03-05 DIAGNOSIS — N7011 Chronic salpingitis: Secondary | ICD-10-CM | POA: Diagnosis not present

## 2019-03-05 LAB — COMPREHENSIVE METABOLIC PANEL
ALT: 17 U/L (ref 0–44)
AST: 20 U/L (ref 15–41)
Albumin: 4.6 g/dL (ref 3.5–5.0)
Alkaline Phosphatase: 44 U/L (ref 38–126)
Anion gap: 7 (ref 5–15)
BUN: 10 mg/dL (ref 6–20)
CO2: 25 mmol/L (ref 22–32)
Calcium: 8.9 mg/dL (ref 8.9–10.3)
Chloride: 105 mmol/L (ref 98–111)
Creatinine, Ser: 0.69 mg/dL (ref 0.44–1.00)
GFR calc Af Amer: >60 mL/min (ref >60)
GFR calc non Af Amer: >60 mL/min (ref >60)
Glucose, Bld: 108 mg/dL — ABNORMAL HIGH (ref 70–99)
Potassium: 4.2 mmol/L (ref 3.5–5.1)
Sodium: 137 mmol/L (ref 135–145)
Total Bilirubin: 0.6 mg/dL (ref 0.3–1.2)
Total Protein: 7.5 g/dL (ref 6.5–8.1)

## 2019-03-05 LAB — URINALYSIS, COMPLETE (UACMP) WITH MICROSCOPIC
Bacteria, UA: NONE SEEN
Bilirubin Urine: NEGATIVE
Glucose, UA: NEGATIVE mg/dL
Ketones, ur: NEGATIVE mg/dL
Leukocytes,Ua: NEGATIVE
Nitrite: NEGATIVE
Protein, ur: NEGATIVE mg/dL
Specific Gravity, Urine: 1.005 (ref 1.005–1.030)
pH: 7 (ref 5.0–8.0)

## 2019-03-05 LAB — CBC
HCT: 41.4 % (ref 36.0–46.0)
Hemoglobin: 14.6 g/dL (ref 12.0–15.0)
MCH: 33.2 pg (ref 26.0–34.0)
MCHC: 35.3 g/dL (ref 30.0–36.0)
MCV: 94.1 fL (ref 80.0–100.0)
Platelets: 242 10*3/uL (ref 150–400)
RBC: 4.4 MIL/uL (ref 3.87–5.11)
RDW: 10.9 % — ABNORMAL LOW (ref 11.5–15.5)
WBC: 7.4 10*3/uL (ref 4.0–10.5)
nRBC: 0 % (ref 0.0–0.2)

## 2019-03-05 LAB — CHLAMYDIA/NGC RT PCR (ARMC ONLY)
Chlamydia Tr: NOT DETECTED
N gonorrhoeae: NOT DETECTED

## 2019-03-05 LAB — WET PREP, GENITAL
Clue Cells Wet Prep HPF POC: NONE SEEN
Sperm: NONE SEEN
Trich, Wet Prep: NONE SEEN
Yeast Wet Prep HPF POC: NONE SEEN

## 2019-03-05 LAB — POCT PREGNANCY, URINE: Preg Test, Ur: NEGATIVE

## 2019-03-05 LAB — LIPASE, BLOOD: Lipase: 29 U/L (ref 11–51)

## 2019-03-05 MED ORDER — HYDROMORPHONE HCL 1 MG/ML IJ SOLN
0.5000 mg | Freq: Once | INTRAMUSCULAR | Status: AC
  Administered 2019-03-05: 0.5 mg via INTRAVENOUS
  Filled 2019-03-05: qty 1

## 2019-03-05 MED ORDER — OXYCODONE-ACETAMINOPHEN 5-325 MG PO TABS
2.0000 | ORAL_TABLET | Freq: Once | ORAL | Status: AC
  Administered 2019-03-05: 2 via ORAL
  Filled 2019-03-05: qty 2

## 2019-03-05 MED ORDER — OXYCODONE-ACETAMINOPHEN 5-325 MG PO TABS: 1.0000 | ORAL_TABLET | Freq: Three times a day (TID) | ORAL | 0 refills | Status: DC | PRN

## 2019-03-05 MED ORDER — SODIUM CHLORIDE 0.9% FLUSH: 3.0000 mL | Freq: Once | INTRAVENOUS | Status: DC

## 2019-03-05 MED ORDER — DOXYCYCLINE HYCLATE 100 MG PO CAPS: 100.0000 mg | ORAL_CAPSULE | Freq: Two times a day (BID) | ORAL | 0 refills | Status: DC

## 2019-03-05 MED ORDER — ONDANSETRON HCL 4 MG/2ML IJ SOLN
4.0000 mg | Freq: Once | INTRAMUSCULAR | Status: AC
  Administered 2019-03-05: 4 mg via INTRAVENOUS
  Filled 2019-03-05: qty 2

## 2019-03-05 MED ORDER — METRONIDAZOLE 500 MG PO TABS: 500.0000 mg | ORAL_TABLET | Freq: Two times a day (BID) | ORAL | 0 refills | Status: DC

## 2019-03-05 MED ORDER — FENTANYL CITRATE (PF) 100 MCG/2ML IJ SOLN
25.0000 ug | Freq: Once | INTRAMUSCULAR | Status: DC
  Filled 2019-03-05: qty 2

## 2019-03-05 MED ORDER — SODIUM CHLORIDE 0.9 % IV SOLN
Freq: Once | INTRAVENOUS | Status: AC
  Administered 2019-03-05: 19:00:00 via INTRAVENOUS

## 2019-03-05 NOTE — ED Notes (Signed)
Pt wanting more medication at this time. MD Williams aware.

## 2019-03-05 NOTE — ED Notes (Signed)
Pt speaking with Dr. Jimmye Norman at this time. Pt alert and answers questions appropriately

## 2019-03-05 NOTE — ED Notes (Signed)
Pt to US at this time.

## 2019-03-05 NOTE — ED Provider Notes (Signed)
[facility] Emergency Department Provider Note       Time seen: ----------------------------------------- 5:24 PM on 03/05/2019 -----------------------------------------   I have reviewed the triage vital signs and the nursing notes.  HISTORY   Chief Complaint Abdominal Pain    HPI Hannah Shaw is a 23 y.o. female with a history of asthma, chronic kidney disease, endometriosis, hypothyroidism who presents to the ED for lower abdominal pain.  Patient states the pain is in her suprapubic area.  She is typically seen by her GYN doctor for endometriosis but has not seen her in a while.  Past Medical History:  Diagnosis Date  . Asthma    WELL CONTROLLED  . Chronic kidney disease    H/O STONES  . Endometriosis   . GERD (gastroesophageal reflux disease)   . Headache   . Hypothyroidism   . Thyroid disease     There are no active problems to display for this patient.   Past Surgical History:  Procedure Laterality Date  . APPENDECTOMY    . ENDOMETRIAL BIOPSY    . LAPAROSCOPY N/A 03/13/2016   Procedure: LAPAROSCOPY DIAGNOSTIC;  Surgeon: Honor Loh Ward, MD;  Location: [facility] ORS;  Service: Gynecology;  Laterality: N/A;    Allergies Morphine and related; Fentanyl; Betadine [povidone iodine]; and Iodine  Social History Social History   Tobacco Use  . Smoking status: Current Every Day Smoker    Packs/day: 0.25    Types: Cigarettes  . Smokeless tobacco: Never Used  Substance Use Topics  . Alcohol use: No  . Drug use: No    Review of Systems Constitutional: Negative for fever. Cardiovascular: Negative for chest pain. Respiratory: Negative for shortness of breath. Gastrointestinal: Positive for abdominal pain, vomiting Musculoskeletal: Negative for back pain. Skin: Negative for rash. Neurological: Negative for headaches, focal weakness or numbness.  All systems negative/normal/unremarkable except as stated in the  HPI  ____________________________________________   PHYSICAL EXAM:  VITAL SIGNS: ED Triage Vitals  Enc Vitals Group     BP 03/05/19 1525 119/71     Pulse Rate 03/05/19 1525 (!) 105     Resp 03/05/19 1525 18     Temp 03/05/19 1525 99.8 F (37.7 C)     Temp Source 03/05/19 1525 Oral     SpO2 03/05/19 1525 99 %     Weight 03/05/19 1527 93 lb (42.2 kg)     Height 03/05/19 1527 5\' 2"  (1.575 m)     Head Circumference --      Peak Flow --      Pain Score 03/05/19 1527 10     Pain Loc --      Pain Edu? --      Excl. in [place]? --    Constitutional: Alert and oriented. Well appearing and in no distress. Eyes: Conjunctivae are normal. Normal extraocular movements. Cardiovascular: Normal rate, regular rhythm. No murmurs, rubs, or gallops. Respiratory: Normal respiratory effort without tachypnea nor retractions. Breath sounds are clear and equal bilaterally. No wheezes/rales/rhonchi. Genitourinary: Mild vaginal discharge with mild cervical motion tenderness Gastrointestinal: Suprapubic tenderness, no rebound or guarding.  Normal bowel sounds. Musculoskeletal: Nontender with normal range of motion in extremities. No lower extremity tenderness nor edema. Neurologic:  Normal speech and language. No gross focal neurologic deficits are appreciated.  Skin:  Skin is warm, dry and intact. No rash noted. ____________________________________________  ED COURSE:  As part of my medical decision making, I reviewed the following data within the [place] History obtained  from family if available, nursing notes, old chart and ekg, as well as notes from prior ED visits. Patient presented for abdominal pain, we will assess with labs and imaging as indicated at this time. Clinical Course as of Mar 05 2051  Nancy Fetter Mar 05, 2019  2012 Preg Test, Ur: NEGATIVE [JW]    Clinical Course User Index [JW] Earleen Newport, MD   Procedures  Hannah Shaw was evaluated in Emergency Department  on 03/05/2019 for the symptoms described in the history of present illness. She was evaluated in the context of the global COVID-19 pandemic, which necessitated consideration that the patient might be at risk for infection with the SARS-CoV-2 virus that causes COVID-19. Institutional protocols and algorithms that pertain to the evaluation of patients at risk for COVID-19 are in a state of rapid change based on information released by regulatory bodies including the CDC and federal and state organizations. These policies and algorithms were followed during the patient's care in the ED.  ____________________________________________   LABS (pertinent positives/negatives)  Labs Reviewed  WET PREP, GENITAL - Abnormal; Notable for the following components:      Result Value   WBC, Wet Prep HPF POC MANY (*)    All other components within normal limits  COMPREHENSIVE METABOLIC PANEL - Abnormal; Notable for the following components:   Glucose, Bld 108 (*)    All other components within normal limits  CBC - Abnormal; Notable for the following components:   RDW 10.9 (*)    All other components within normal limits  URINALYSIS, COMPLETE (UACMP) WITH MICROSCOPIC - Abnormal; Notable for the following components:   Color, Urine STRAW (*)    APPearance CLEAR (*)    Hgb urine dipstick MODERATE (*)    All other components within normal limits  CHLAMYDIA/NGC RT PCR ([facility] ONLY)  LIPASE, BLOOD  POC URINE PREG, ED  POCT PREGNANCY, URINE    RADIOLOGY Images were viewed by me  Pelvic ultrasound IMPRESSION: 1. Unremarkable uterus, endometrium, and the ovaries. Doppler detected arterial and venous flow to both ovaries. 2. 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 Clinical correlation and follow-up with ultrasound in 3 months recommended. ____________________________________________   DIFFERENTIAL DIAGNOSIS   STD, PID, UTI, endometriosis, ovarian cyst, ovarian torsion  FINAL ASSESSMENT AND  PLAN  Pelvic pain, hydrosalpinx   Plan: The patient had presented for pelvic pain. Patient's labs were overall reassuring. Patient's imaging revealed a possible right hydrosalpinx.  She will be given antibiotics and pain medicine and will be referred to a GYN for outpatient follow-up.   Laurence Aly, MD    Note: This note was generated in part or whole with voice recognition software. Voice recognition is usually quite accurate but there are transcription errors that can and very often do occur. I apologize for any typographical errors that were not detected and corrected.     Earleen Newport, MD 03/05/19 [phone]

## 2019-03-05 NOTE — ED Triage Notes (Signed)
Pt presents to ED via POV with c/o lower abdominal pain. Pt states pain is directly under umbilicus at this time. Pt states +N/V, denies diarrhea.

## 2019-03-06 ENCOUNTER — Telehealth: Payer: Self-pay

## 2019-03-06 ENCOUNTER — Telehealth: Payer: Self-pay | Admitting: Obstetrics and Gynecology

## 2019-03-06 NOTE — ED Provider Notes (Signed)
Contacted by pharmacist who is concerned about prescription for oxycodone, pharmacist has noted that the patient appears to be "doctor shopping "with multiple  prescriptions from multiple areas in [place].  Given this information we will hold off on prescribing the oxycodone because there is concern for possible abuse   Lavonia Drafts, MD 03/06/19 [phone]

## 2019-03-06 NOTE — Telephone Encounter (Signed)
Pt went to the ER and reqeusting a refill of her oxyCODONE-acetaminophen (PERCOCET) 5-325 MG tablet. States that they advised will have to have her tubes removed.

## 2019-03-06 NOTE — Telephone Encounter (Signed)
This lady has had endometriosis, and an excess desire for opiates. I am going to start with an E-visit, there's time Thursday.

## 2019-03-06 NOTE — Telephone Encounter (Signed)
Pt is requesting pain meds. Seen in ED yesterday given percocet but is unable to fill.   Would like DJE to rx. Pt aware DJE in tomorrow.

## 2019-03-07 NOTE — Telephone Encounter (Signed)
Patient is no longer seeing Dr. Amalia Hailey. She sees another Materials engineer. I have called to let the patient know that due to not being our patient we would not be able to feel this.

## 2019-03-09 ENCOUNTER — Encounter: Payer: Self-pay | Admitting: Obstetrics and Gynecology

## 2019-03-09 ENCOUNTER — Other Ambulatory Visit: Payer: Self-pay

## 2019-03-09 ENCOUNTER — Ambulatory Visit (INDEPENDENT_AMBULATORY_CARE_PROVIDER_SITE_OTHER): Payer: Medicare Other | Admitting: Obstetrics and Gynecology

## 2019-03-09 VITALS — Ht 62.0 in | Wt 93.0 lb

## 2019-03-09 DIAGNOSIS — R102 Pelvic and perineal pain: Secondary | ICD-10-CM | POA: Diagnosis not present

## 2019-03-09 NOTE — Progress Notes (Signed)
Patient ID: Hannah Shaw, female   DOB: Sep 22, 1996, 23 y.o.   MRN: [id number]    TELEHEALTH VIRTUAL GYNECOLOGY VISIT ENCOUNTER NOTE  I connected with Hannah Shaw on 03/09/2019 at 10:15 AM EDT by telephone at home and verified that I am speaking with the correct person using two identifiers.   I discussed the limitations, risks, security and privacy concerns of performing an evaluation and management service by telephone and the availability of in person appointments. I also discussed with the patient that there may be a patient responsible charge related to this service. The patient expressed understanding and agreed to proceed.   History:  Hannah Shaw is a 23 y.o. G44P0010 female being evaluated today for discussion of pain medication. She went to [facility] ER on Sunday 5/10 for stomach pain. She was recently prescribed BCPs and Elagolix 220 mg BID for endometriosis but they do not help. Ibuprofen does not help either. Pt has received other diagnosis from various physicians such as hydrosalpinx, but scans do not suggest this. Validity of self-diagnosis is uncertain.   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 Conversation made difficult because of 8th grade education and speech impediment. She lives with her mom, dad, and brothers. Pt does not have reliable transportation, and lives in [place]. She denies any abnormal vaginal discharge, bleeding, pelvic pain or other concerns.  She has been seen in several emergency rooms, [facility] in addition to being seen in our office.  She states that she had not seen a gynecologist but one time since 2014 when her [facility] gynecologist performed laparoscopy and diagnosed endometriosis with biopsies performed at that time on what was described as a small area of endometriosis on the uterosacral ligaments     Past Medical History:  Diagnosis  Date  . Asthma    WELL CONTROLLED  . Chronic kidney disease    H/O STONES  . Endometriosis   . GERD (gastroesophageal reflux disease)   . Headache   . Hypothyroidism   . Thyroid disease    Past Surgical History:  Procedure Laterality Date  . APPENDECTOMY    . ENDOMETRIAL BIOPSY    . LAPAROSCOPY N/A 03/13/2016   Procedure: LAPAROSCOPY DIAGNOSTIC;  Surgeon: Honor Loh Ward, MD;  Location: [facility] ORS;  Service: Gynecology;  Laterality: N/A;   The following portions of the patient's history were reviewed and updated as appropriate: allergies, current medications, past family history, past medical history, past social history, past surgical history and problem list.   Health Maintenance:  No PAP or mammogram on record.  Review of Systems:  Pertinent items noted in HPI and remainder of comprehensive ROS otherwise negative.  Physical Exam:   General:  Alert, oriented and cooperative.   Mental Status: Normal mood and affect perceived. Normal judgment and thought content.  Physical exam deferred due to nature of the encounter  Labs and Imaging Results for orders placed or performed during the hospital encounter of 03/05/19 (from the past 336 hour(s))  Lipase, blood   Collection Time: 03/05/19  3:30 PM  Result Value Ref Range   Lipase 29 11 - 51 U/L  Comprehensive metabolic panel   Collection Time: 03/05/19  3:30 PM  Result Value Ref Range   Sodium 137 135 - 145 mmol/L   Potassium 4.2 3.5 - 5.1 mmol/L   Chloride 105 98 - 111 mmol/L   CO2 25  22 - 32 mmol/L   Glucose, Bld 108 (H) 70 - 99 mg/dL   BUN 10 6 - 20 mg/dL   Creatinine, Ser 0.69 0.44 - 1.00 mg/dL   Calcium 8.9 8.9 - 10.3 mg/dL   Total Protein 7.5 6.5 - 8.1 g/dL   Albumin 4.6 3.5 - 5.0 g/dL   AST 20 15 - 41 U/L   ALT 17 0 - 44 U/L   Alkaline Phosphatase 44 38 - 126 U/L   Total Bilirubin 0.6 0.3 - 1.2 mg/dL   GFR calc non Af Amer >60 >60 mL/min   GFR calc Af Amer >60 >60 mL/min   Anion gap 7 5 - 15  CBC   Collection  Time: 03/05/19  3:30 PM  Result Value Ref Range   WBC 7.4 4.0 - 10.5 K/uL   RBC 4.40 3.87 - 5.11 MIL/uL   Hemoglobin 14.6 12.0 - 15.0 g/dL   HCT 41.4 36.0 - 46.0 %   MCV 94.1 80.0 - 100.0 fL   MCH 33.2 26.0 - 34.0 pg   MCHC 35.3 30.0 - 36.0 g/dL   RDW 10.9 (L) 11.5 - 15.5 %   Platelets 242 150 - 400 K/uL   nRBC 0.0 0.0 - 0.2 %  Wet prep, genital   Collection Time: 03/05/19  5:39 PM  Result Value Ref Range   Yeast Wet Prep HPF POC NONE SEEN NONE SEEN   Trich, Wet Prep NONE SEEN NONE SEEN   Clue Cells Wet Prep HPF POC NONE SEEN NONE SEEN   WBC, Wet Prep HPF POC MANY (A) NONE SEEN   Sperm NONE SEEN   Chlamydia/NGC rt PCR ([facility] only)   Collection Time: 03/05/19  5:39 PM  Result Value Ref Range   Specimen source GC/Chlam ENDOCERVICAL    Chlamydia Tr NOT DETECTED NOT DETECTED   N gonorrhoeae NOT DETECTED NOT DETECTED  Urinalysis, Complete w Microscopic   Collection Time: 03/05/19  5:39 PM  Result Value Ref Range   Color, Urine STRAW (A) YELLOW   APPearance CLEAR (A) CLEAR   Specific Gravity, Urine 1.005 1.005 - 1.030   pH 7.0 5.0 - 8.0   Glucose, UA NEGATIVE NEGATIVE mg/dL   Hgb urine dipstick MODERATE (A) NEGATIVE   Bilirubin Urine NEGATIVE NEGATIVE   Ketones, ur NEGATIVE NEGATIVE mg/dL   Protein, ur NEGATIVE NEGATIVE mg/dL   Nitrite NEGATIVE NEGATIVE   Leukocytes,Ua NEGATIVE NEGATIVE   RBC / HPF 6-10 0 - 5 RBC/hpf   WBC, UA 0-5 0 - 5 WBC/hpf   Bacteria, UA NONE SEEN NONE SEEN   Squamous Epithelial / LPF 0-5 0 - 5  Pregnancy, urine POC   Collection Time: 03/05/19  5:42 PM  Result Value Ref Range   Preg Test, Ur NEGATIVE NEGATIVE   US Pelvis Transvanginal Non-ob (tv Only)  Result Date: 03/05/2019 CLINICAL DATA:  23 year old female with pelvic pain. EXAM: TRANSABDOMINAL AND TRANSVAGINAL ULTRASOUND OF PELVIS DOPPLER ULTRASOUND OF OVARIES TECHNIQUE: Both transabdominal and transvaginal ultrasound examinations of the pelvis were performed. Transabdominal technique was  performed for global imaging of the pelvis including uterus, ovaries, adnexal regions, and pelvic cul-de-sac. It was necessary to proceed with endovaginal exam following the transabdominal exam to visualize the endometrium and ovaries. Color and duplex Doppler ultrasound was utilized to evaluate blood flow to the ovaries. COMPARISON:  CT of the abdomen pelvis dated 06/29/2018 FINDINGS: Uterus Measurements: 5.6 x 2.7 x 3.5 cm = volume: 28 mL. The uterus is anteverted and appears unremarkable. Endometrium Thickness:  4 mm.  No focal abnormality visualized. Right ovary Measurements: 2.6 x 1.1 x 1.7 cm = volume: 2.5 mL. The right ovary is unremarkable. There is a 1.9 x 1.1 x 1.7 cm somewhat tubular appearing structure adjacent to the right ovary in the right adnexa, likely representing a mildly dilated fallopian tube. Left ovary Measurements: 2.7 x 1.7 x 1.6 cm = volume: 4.0 mL. Normal appearance/no adnexal mass. Pulsed Doppler evaluation of both ovaries demonstrates normal low-resistance arterial and venous waveforms. Other findings No abnormal free fluid. IMPRESSION: 1. Unremarkable uterus, endometrium, and the ovaries. Doppler detected arterial and venous flow to both ovaries. 2. 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 Clinical correlation and follow-up with ultrasound in 3 months recommended. Electronically Signed   By: Anner Crete M.D.   On: 03/05/2019 20:48   US Pelvis Complete  Result Date: 03/05/2019 CLINICAL DATA:  23 year old female with pelvic pain. EXAM: TRANSABDOMINAL AND TRANSVAGINAL ULTRASOUND OF PELVIS DOPPLER ULTRASOUND OF OVARIES TECHNIQUE: Both transabdominal and transvaginal ultrasound examinations of the pelvis were performed. Transabdominal technique was performed for global imaging of the pelvis including uterus, ovaries, adnexal regions, and pelvic cul-de-sac. It was necessary to proceed with endovaginal exam following the transabdominal exam to visualize the endometrium and ovaries. Color and  duplex Doppler ultrasound was utilized to evaluate blood flow to the ovaries. COMPARISON:  CT of the abdomen pelvis dated 06/29/2018 FINDINGS: Uterus Measurements: 5.6 x 2.7 x 3.5 cm = volume: 28 mL. The uterus is anteverted and appears unremarkable. Endometrium Thickness: 4 mm.  No focal abnormality visualized. Right ovary Measurements: 2.6 x 1.1 x 1.7 cm = volume: 2.5 mL. The right ovary is unremarkable. There is a 1.9 x 1.1 x 1.7 cm somewhat tubular appearing structure adjacent to the right ovary in the right adnexa, likely representing a mildly dilated fallopian tube. Left ovary Measurements: 2.7 x 1.7 x 1.6 cm = volume: 4.0 mL. Normal appearance/no adnexal mass. Pulsed Doppler evaluation of both ovaries demonstrates normal low-resistance arterial and venous waveforms. Other findings No abnormal free fluid. IMPRESSION: 1. Unremarkable uterus, endometrium, and the ovaries. Doppler detected arterial and venous flow to both ovaries. 2. 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 Clinical correlation and follow-up with ultrasound in 3 months recommended. Electronically Signed   By: Anner Crete M.D.   On: 03/05/2019 20:48   US Pelvic Doppler (torsion R/o Or Mass Arterial Flow)  Result Date: 03/05/2019 CLINICAL DATA:  23 year old female with pelvic pain. EXAM: TRANSABDOMINAL AND TRANSVAGINAL ULTRASOUND OF PELVIS DOPPLER ULTRASOUND OF OVARIES TECHNIQUE: Both transabdominal and transvaginal ultrasound examinations of the pelvis were performed. Transabdominal technique was performed for global imaging of the pelvis including uterus, ovaries, adnexal regions, and pelvic cul-de-sac. It was necessary to proceed with endovaginal exam following the transabdominal exam to visualize the endometrium and ovaries. Color and duplex Doppler ultrasound was utilized to evaluate blood flow to the ovaries. COMPARISON:  CT of the abdomen pelvis dated 06/29/2018 FINDINGS: Uterus Measurements: 5.6 x 2.7 x 3.5 cm = volume: 28 mL. The uterus  is anteverted and appears unremarkable. Endometrium Thickness: 4 mm.  No focal abnormality visualized. Right ovary Measurements: 2.6 x 1.1 x 1.7 cm = volume: 2.5 mL. The right ovary is unremarkable. There is a 1.9 x 1.1 x 1.7 cm somewhat tubular appearing structure adjacent to the right ovary in the right adnexa, likely representing a mildly dilated fallopian tube. Left ovary Measurements: 2.7 x 1.7 x 1.6 cm = volume: 4.0 mL. Normal appearance/no adnexal mass. Pulsed Doppler evaluation of both ovaries demonstrates  normal low-resistance arterial and venous waveforms. Other findings No abnormal free fluid. IMPRESSION: 1. Unremarkable uterus, endometrium, and the ovaries. Doppler detected arterial and venous flow to both ovaries. 2. 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 Clinical correlation and follow-up with ultrasound in 3 months recommended. Electronically Signed   By: Anner Crete M.D.   On: 03/05/2019 20:48      Assessment and Plan:        1. Order CBC and sed rate for next week 2. Repeat Laparoscopy  recommended patient who seems interested in answers.  She declines continued monitoring while on continuous OCPs and elagolix 200 mg twice daily, the endometriosis dosing. 3. F/U face-to-face in 2 weeks for pre-op 4. Continue BCPs 5.  Will not give opiates to patient  I discussed the assessment and treatment plan with the patient. The patient was provided an opportunity to ask questions and all were answered. The patient agreed with the plan and demonstrated an understanding of the instructions.   The patient was advised to call back or seek an in-person evaluation/go to the ED if the symptoms worsen or if the condition fails to improve as anticipated.  I provided 15 minutes of non-face-to-face time during this encounter.  By signing my name below, I, De Burrs, attest that this documentation has been prepared under the direction and in the presence of Jonnie Kind, MD. Electronically  Signed: De Burrs, Medical Scribe. 03/09/19. 11:23 AM.  I personally performed the services described in this documentation, which was SCRIBED in my presence. The recorded information has been reviewed and considered accurate. It has been edited as necessary during review. Jonnie Kind, MD

## 2019-03-16 LAB — SEDIMENTATION RATE: Sed Rate: 2 mm/hr (ref 0–32)

## 2019-03-16 LAB — CBC WITH DIFFERENTIAL/PLATELET
Basophils Absolute: 0 10*3/uL (ref 0.0–0.2)
Basos: 1 %
EOS (ABSOLUTE): 0.1 10*3/uL (ref 0.0–0.4)
Eos: 2 %
Hematocrit: 42.6 % (ref 34.0–46.6)
Hemoglobin: 14.9 g/dL (ref 11.1–15.9)
Immature Grans (Abs): 0 10*3/uL (ref 0.0–0.1)
Immature Granulocytes: 0 %
Lymphocytes Absolute: 1.7 10*3/uL (ref 0.7–3.1)
Lymphs: 28 %
MCH: 33.2 pg — ABNORMAL HIGH (ref 26.6–33.0)
MCHC: 35 g/dL (ref 31.5–35.7)
MCV: 95 fL (ref 79–97)
Monocytes Absolute: 0.4 10*3/uL (ref 0.1–0.9)
Monocytes: 7 %
Neutrophils Absolute: 3.8 10*3/uL (ref 1.4–7.0)
Neutrophils: 62 %
Platelets: 256 10*3/uL (ref 150–450)
RBC: 4.49 x10E6/uL (ref 3.77–5.28)
RDW: 11.4 % — ABNORMAL LOW (ref 11.7–15.4)
WBC: 6.1 10*3/uL (ref 3.4–10.8)

## 2019-03-24 ENCOUNTER — Telehealth: Payer: Self-pay | Admitting: Obstetrics and Gynecology

## 2019-03-24 NOTE — Telephone Encounter (Signed)
Pt calling for lab results. She states she was to have a face to face appt to discuss. CB#: [phone]

## 2019-03-24 NOTE — Telephone Encounter (Signed)
Patient called, she is requesting results.  [phone]

## 2019-03-27 ENCOUNTER — Telehealth: Payer: Self-pay | Admitting: Obstetrics and Gynecology

## 2019-03-27 NOTE — Telephone Encounter (Signed)
Pt requesting the nurse to call. Her mom has some questions regarding upcoming appt.

## 2019-03-28 NOTE — Telephone Encounter (Signed)
Called patient no answer.

## 2019-03-29 NOTE — Telephone Encounter (Signed)
Called patient no answer, phone just rings.

## 2019-04-03 ENCOUNTER — Ambulatory Visit: Payer: Self-pay | Admitting: Obstetrics and Gynecology

## 2019-04-05 ENCOUNTER — Ambulatory Visit (INDEPENDENT_AMBULATORY_CARE_PROVIDER_SITE_OTHER): Payer: Medicare Other | Admitting: Obstetrics and Gynecology

## 2019-04-05 ENCOUNTER — Encounter: Payer: Self-pay | Admitting: Obstetrics and Gynecology

## 2019-04-05 ENCOUNTER — Other Ambulatory Visit: Payer: Self-pay

## 2019-04-05 DIAGNOSIS — Z113 Encounter for screening for infections with a predominantly sexual mode of transmission: Secondary | ICD-10-CM | POA: Diagnosis not present

## 2019-04-05 NOTE — Progress Notes (Signed)
Patient ID: Hannah Shaw, female   DOB: 09/17/1996, 23 y.o.   MRN: [id number]  Preoperative History and Physical: plans for surgery postponed.  Hannah Shaw is a 23 y.o. G1P0010 here for surgical management of Suspected endometriosis. Says she smokes 1 cigarette a day at the most, mother and father both smoke. Had laparoscopy done at 73 at [facility] and another one done when 69 was at [facility] in [place]. After some consideration, pt should stay on Elagolix for a longer time to evaluate success. Interpreting patient's comments remains difficult due to speech patterns, and education. Has not noticed any difference of sense of self since starting elagolix in April. Has consistent bowel movements.   Has not had sex in the last months but notes having sex every once in a while. Says is in pain at present time of appointment saying it is severe pain. Pt does not appear in any distress No significant preoperative concerns.  Proposed surgery:   Past Medical History:  Diagnosis Date   Asthma    WELL CONTROLLED   Chronic kidney disease    H/O STONES   Endometriosis    GERD (gastroesophageal reflux disease)    Headache    Hypothyroidism    Thyroid disease    Past Surgical History:  Procedure Laterality Date   APPENDECTOMY     ENDOMETRIAL BIOPSY     LAPAROSCOPY N/A 03/13/2016   Procedure: LAPAROSCOPY DIAGNOSTIC;  Surgeon: Honor Loh Ward, MD;  Location: [facility] ORS;  Service: Gynecology;  Laterality: N/A;   OB History  Gravida Para Term Preterm AB Living  1 0 0 0 1 0  SAB TAB Ectopic Multiple Live Births  1 0 0 0 0    # Outcome Date GA Lbr Len/2nd Weight Sex Delivery Anes PTL Lv  1 SAB 2019          Patient denies any other pertinent gynecologic issues.   Current Outpatient Medications on File Prior to Visit  Medication Sig Dispense Refill   clonazePAM (KLONOPIN) 1 MG tablet Take 45 mg by mouth 2 (two) times daily as needed.      desogestrel-ethinyl  estradiol (MIRCETTE) 0.15-0.02/0.01 MG (21/5) tablet Take 1 tablet by mouth at bedtime. 1 Package 6   ibuprofen (ADVIL,MOTRIN) 600 MG tablet Take 1 tablet (600 mg total) by mouth every 6 (six) hours as needed. 30 tablet 0   levothyroxine (SYNTHROID, LEVOTHROID) 25 MCG tablet Take 25 mcg by mouth daily before breakfast.      oxyCODONE-acetaminophen (PERCOCET) 5-325 MG tablet Take 1 tablet by mouth every 8 (eight) hours as needed. 12 tablet 0   doxycycline (VIBRAMYCIN) 100 MG capsule Take 1 capsule (100 mg total) by mouth 2 (two) times daily. (Patient not taking: Reported on 04/05/2019) 14 capsule 0   Elagolix Sodium 200 MG TABS Take 200 mg by mouth 2 (two) times a day. (Patient not taking: Reported on 04/05/2019) 60 tablet 11   FLUoxetine (PROZAC) 20 MG capsule Take 1 capsule by mouth daily.     ketorolac (TORADOL) 10 MG tablet Take 1 tablet (10 mg total) by mouth every 8 (eight) hours. (Patient not taking: Reported on 03/09/2019) 15 tablet 0   meloxicam (MOBIC) 15 MG tablet Take 1 tablet (15 mg total) by mouth daily. (Patient not taking: Reported on 12/01/2018) 30 tablet 0   methocarbamol (ROBAXIN) 500 MG tablet Take 1 tablet (500 mg total) by mouth 4 (four) times daily. (Patient not taking: Reported on 12/01/2018) 16 tablet 0  metroNIDAZOLE (FLAGYL) 500 MG tablet Take 1 tablet (500 mg total) by mouth 2 (two) times daily. (Patient not taking: Reported on 03/09/2019) 14 tablet 0   norethindrone (MICRONOR,CAMILA,ERRIN) 0.35 MG tablet Take 1 tablet (0.35 mg total) by mouth daily. (Patient not taking: Reported on 04/05/2019) 1 Package 11   ondansetron (ZOFRAN ODT) 4 MG disintegrating tablet Take 1 tablet (4 mg total) by mouth every 8 (eight) hours as needed. 15 tablet 0   oxyCODONE-acetaminophen (PERCOCET) 7.5-325 MG tablet Take 1 tablet by mouth every 6 (six) hours as needed for severe pain. (Patient not taking: Reported on 04/05/2019) 30 tablet 0   No current facility-administered medications on  file prior to visit.    Allergies  Allergen Reactions   Morphine And Related Hives and Shortness Of Breath   Fentanyl     PT DENIES THIS ALLERGY DURING PHONE INTERVIEW ON 03-05-16   Betadine [Povidone Iodine] Rash   Iodine Rash    Social History:   reports that she has been smoking cigarettes. She has been smoking about 0.25 packs per day. She has never used smokeless tobacco. She reports that she does not drink alcohol or use drugs.  Family History  Problem Relation Age of Onset   Cervical cancer Mother    Lung cancer Paternal Uncle     Review of Systems: Noncontributory  PHYSICAL EXAM: Blood pressure 91/63, pulse 92, height 5\' 2"  (1.575 m), weight 96 lb 9.6 oz (43.8 kg), last menstrual period 04/02/2019. General appearance - alert, well appearing, and in no distress Chest - clear to auscultation, no wheezes, rales or rhonchi, symmetric air entry Heart - normal rate and regular rhythm Abdomen - soft, nontender, nondistended, no masses or organomegaly Pelvic - VAGINA: normal appearing vagina with normal color and discharge, no lesions, CERVIX: normal appearing cervix without discharge or lesions, UTERUS: uterus is normal size, shape, consistency and nontender. Full bladder at limits GCCHL collected Extremities - peripheral pulses normal, no pedal edema, no clubbing or cyanosis  Labs: No results found for this or any previous visit (from the past 336 hour(s)).  Imaging Studies: No results found.  Assessment: There are no active problems to display for this patient.   Plan:  Pain calendar 6 weeks.    By signing my name below, I, Samul Dada, attest that this documentation has been prepared under the direction and in the presence of Jonnie Kind, MD. Electronically Signed: [place]. 04/05/19. 4:09 PM.  I personally performed the services described in this documentation, which was SCRIBED in my presence. The recorded information has been  reviewed and considered accurate. It has been edited as necessary during review. Jonnie Kind, MD

## 2019-04-07 ENCOUNTER — Telehealth: Payer: Self-pay | Admitting: Obstetrics and Gynecology

## 2019-04-07 NOTE — Telephone Encounter (Signed)
Patient called stating that she seen Dr. Glo Herring last week and he was not going to do surgery but she is in a lot of pain and she needs that surgery. Please contact pt

## 2019-04-10 ENCOUNTER — Telehealth: Payer: Self-pay | Admitting: Obstetrics and Gynecology

## 2019-04-10 NOTE — Telephone Encounter (Signed)
Patient called, stated that she wants to speak to Dr. Glo Herring about the stomach pain she is having.  [phone]

## 2019-04-10 NOTE — Telephone Encounter (Signed)
Pt in pain and would like a call from the office

## 2019-04-11 ENCOUNTER — Ambulatory Visit: Payer: Medicare Other | Admitting: Obstetrics and Gynecology

## 2019-04-11 ENCOUNTER — Telehealth: Payer: Self-pay | Admitting: Obstetrics and Gynecology

## 2019-04-11 NOTE — Telephone Encounter (Signed)
Patient called, wants to know why Dr. Glo Herring didn't call her back yesterday.  She calls the office all day long.  [phone]

## 2019-04-11 NOTE — Telephone Encounter (Signed)
Scheduled for tele visit so she can talk to Dr. Glo Herring.

## 2019-04-12 ENCOUNTER — Ambulatory Visit (INDEPENDENT_AMBULATORY_CARE_PROVIDER_SITE_OTHER): Payer: Medicare Other | Admitting: Obstetrics and Gynecology

## 2019-04-12 ENCOUNTER — Other Ambulatory Visit: Payer: Self-pay

## 2019-04-12 DIAGNOSIS — Z8742 Personal history of other diseases of the female genital tract: Secondary | ICD-10-CM | POA: Diagnosis not present

## 2019-04-12 DIAGNOSIS — R195 Other fecal abnormalities: Secondary | ICD-10-CM | POA: Diagnosis not present

## 2019-04-12 DIAGNOSIS — G8929 Other chronic pain: Secondary | ICD-10-CM

## 2019-04-12 DIAGNOSIS — R102 Pelvic and perineal pain: Secondary | ICD-10-CM

## 2019-04-12 NOTE — Telephone Encounter (Signed)
Seen by telephone visit today see note from 04/12/2019

## 2019-04-12 NOTE — Progress Notes (Signed)
TELEHEALTH VIRTUAL GYNECOLOGY VISIT ENCOUNTER NOTE  I connected with [name] on 04/12/19 at  8:30 AM EDT by telephone at home and verified that I am speaking with the correct person using two identifiers.   I discussed the limitations, risks, security and privacy concerns of performing an evaluation and management service by telephone and the availability of in person appointments. I also discussed with the patient that there may be a patient responsible charge related to this service. The patient expressed understanding and agreed to proceed.   History:  Hannah Shaw is a 23 y.o. [id number] female being evaluated today for her chronic complaints of abdominal pain.  History is always difficult because the patient is ninth grade education and there are speech patterns that limit communication today she is on the telephone speaker phone with her mother assisting her on call   She is a 23 year old with a history of laparoscopy x2 for alleged pelvic pain.  The second laparoscopy report has been reviewed and identified 2 tiny areas of endometriosis on the uterosacral ligaments that were biopsied off.  When recently seen in the office the patient had an unremarkable exam of her abdomen, and was on birth control pills using them continuously to try to eliminate periods, at least that is what we had advised her to do, and was on oral contraceptives and she was supposed to be on elagolix 200 mg twice daily to suppress any endometriosis.  She reports that she is taking it.    Review of systems: Patient claims she hurts all the time and "cannot do nothing" other than lay around She reports that she has bowel movements "a lot".  She describes having bowel movements every 2 hours which are solid and sometimes has some blood on the poop.  She specifically describes the bowel movements as not breaking up in the toilet.  I find this history rather hard believe I asked the mother and the background if she had  bowel movements often and mother reports that she has bowel movements maybe once or twice daily so I do not know if this is miscommunications or simply trying to give me a dramatic history.  I have asked the patient to take a photograph of every bowel movement she has for a week.  We will see what that assignment results in, as far as information goes.  She has never seen a gastroenterologist She reports that she gets up at night to have bowel movements every couple of hours.  Her mother corrected her that she does not, mother stating that patient has bowel movements a couple times per day sometimes The patient denies being on her period now, uses pads not tampons, and also makes a statement that she has a period "every dad-gone week".  Again communication is a challenge. Pelvic pain is described as all the time but "worse" with her periods she allegedly has periods that last 7 to 8 days during which time the pain is worse and then the pain returns to baseline Past Medical History:  Diagnosis Date  . Asthma    WELL CONTROLLED  . Chronic kidney disease    H/O STONES  . Endometriosis   . GERD (gastroesophageal reflux disease)   . Headache   . Hypothyroidism   . Thyroid disease    Past Surgical History:  Procedure Laterality Date  . APPENDECTOMY    . ENDOMETRIAL BIOPSY    . LAPAROSCOPY N/A 03/13/2016   Procedure: LAPAROSCOPY DIAGNOSTIC;  Surgeon: Honor Loh Ward, MD;  Location: [facility] ORS;  Service: Gynecology;  Laterality: N/A;   The following portions of the patient's history were reviewed and updated as appropriate: allergies, current medications, past family history, past medical history, past social history, past surgical history and problem list.   Health Maintenance:    Review of Systems:  Pertinent items noted in HPI and remainder of comprehensive ROS otherwise negative.  Physical Exam:   General:  Alert, oriented and cooperative.  She seems pretty relaxed with the telephone  interview process, better than at office visits  Mental Status: Normal mood and affect perceived. Normal judgment and thought content.  Physical exam deferred due to nature of the encounter  Labs and Imaging  No results found.    Assessment and Plan:     There are no diagnoses linked to this encounter. Chronic pelvic pain allegedly with history of endometriosis Abnormal GI pattern of bowel movements Questionable reliability of patient history Low educational levels and communication skills   Have asked the patient take photograph of every bowel movement, the allegedly every 2 hours, for the next week  I think the patient should be referred to [facility] if further laparoscopy is to be considered or other GYN surgeries. I think she needs a GI evaluation once we document the credibility of the story of every 2 hour bowel movements I discussed the assessment and treatment plan with the patient. The patient was provided an opportunity to ask questions and all were answered. The patient agreed with the plan and demonstrated an understanding of the instructions.   The patient was advised to call back or seek an in-person evaluation/go to the ED if the symptoms worsen or if the condition fails to improve as anticipated.  I provided 25 minutes of non-face-to-face time during this encounter. With an additional 15 minutes of chart review and documentation  Jonnie Kind, [place] for [facility], [place]

## 2019-04-13 LAB — GC/CHLAMYDIA PROBE AMP
Chlamydia trachomatis, NAA: NEGATIVE
Neisseria Gonorrhoeae by PCR: NEGATIVE

## 2019-04-19 ENCOUNTER — Telehealth: Payer: Self-pay | Admitting: Obstetrics and Gynecology

## 2019-04-19 NOTE — Telephone Encounter (Signed)
Pt requesting to speak with a nurse. States that she is having abdominal pain.

## 2019-04-19 NOTE — Telephone Encounter (Signed)
Spoke with pt. Pt having stomach pain. Has been going on for a while. Has irregular periods; has a period every 2 weeks. Advised she needs to be seen. Call transferred to front desk for appt. [place]

## 2019-04-25 ENCOUNTER — Ambulatory Visit: Payer: Medicare Other | Admitting: Obstetrics & Gynecology

## 2019-04-30 ENCOUNTER — Emergency Department: Payer: Medicare Other

## 2019-04-30 ENCOUNTER — Other Ambulatory Visit: Payer: Self-pay

## 2019-04-30 ENCOUNTER — Emergency Department
Admission: EM | Admit: 2019-04-30 | Discharge: 2019-04-30 | Disposition: A | Discharge status: Home / Self Care | Payer: Medicare Other | Attending: Emergency Medicine | Admitting: Emergency Medicine

## 2019-04-30 ENCOUNTER — Encounter: Payer: Self-pay | Admitting: Emergency Medicine

## 2019-04-30 DIAGNOSIS — E039 Hypothyroidism, unspecified: Secondary | ICD-10-CM | POA: Insufficient documentation

## 2019-04-30 DIAGNOSIS — N189 Chronic kidney disease, unspecified: Secondary | ICD-10-CM | POA: Insufficient documentation

## 2019-04-30 DIAGNOSIS — F17210 Nicotine dependence, cigarettes, uncomplicated: Secondary | ICD-10-CM | POA: Insufficient documentation

## 2019-04-30 DIAGNOSIS — Z79899 Other long term (current) drug therapy: Secondary | ICD-10-CM | POA: Diagnosis not present

## 2019-04-30 DIAGNOSIS — R1110 Vomiting, unspecified: Secondary | ICD-10-CM | POA: Diagnosis present

## 2019-04-30 DIAGNOSIS — G8929 Other chronic pain: Secondary | ICD-10-CM | POA: Insufficient documentation

## 2019-04-30 DIAGNOSIS — R1030 Lower abdominal pain, unspecified: Secondary | ICD-10-CM | POA: Diagnosis not present

## 2019-04-30 DIAGNOSIS — R109 Unspecified abdominal pain: Secondary | ICD-10-CM

## 2019-04-30 LAB — URINE DRUG SCREEN, QUALITATIVE (ARMC ONLY)
Amphetamines, Ur Screen: NOT DETECTED
Barbiturates, Ur Screen: NOT DETECTED
Benzodiazepine, Ur Scrn: NOT DETECTED
Cannabinoid 50 Ng, Ur ~~LOC~~: NOT DETECTED
Cocaine Metabolite,Ur ~~LOC~~: NOT DETECTED
MDMA (Ecstasy)Ur Screen: NOT DETECTED
Methadone Scn, Ur: NOT DETECTED
Opiate, Ur Screen: NOT DETECTED
Phencyclidine (PCP) Ur S: NOT DETECTED
Tricyclic, Ur Screen: NOT DETECTED

## 2019-04-30 LAB — URINALYSIS, COMPLETE (UACMP) WITH MICROSCOPIC
Bilirubin Urine: NEGATIVE
Glucose, UA: NEGATIVE mg/dL
Ketones, ur: NEGATIVE mg/dL
Nitrite: NEGATIVE
Protein, ur: NEGATIVE mg/dL
Specific Gravity, Urine: 1.014 (ref 1.005–1.030)
pH: 6 (ref 5.0–8.0)

## 2019-04-30 LAB — CBC
HCT: 41.6 % (ref 36.0–46.0)
Hemoglobin: 14.3 g/dL (ref 12.0–15.0)
MCH: 32.4 pg (ref 26.0–34.0)
MCHC: 34.4 g/dL (ref 30.0–36.0)
MCV: 94.1 fL (ref 80.0–100.0)
Platelets: 243 10*3/uL (ref 150–400)
RBC: 4.42 MIL/uL (ref 3.87–5.11)
RDW: 11.1 % — ABNORMAL LOW (ref 11.5–15.5)
WBC: 6 10*3/uL (ref 4.0–10.5)
nRBC: 0 % (ref 0.0–0.2)

## 2019-04-30 LAB — COMPREHENSIVE METABOLIC PANEL
ALT: 16 U/L (ref 0–44)
AST: 20 U/L (ref 15–41)
Albumin: 4.4 g/dL (ref 3.5–5.0)
Alkaline Phosphatase: 43 U/L (ref 38–126)
Anion gap: 9 (ref 5–15)
BUN: 8 mg/dL (ref 6–20)
CO2: 21 mmol/L — ABNORMAL LOW (ref 22–32)
Calcium: 8.9 mg/dL (ref 8.9–10.3)
Chloride: 110 mmol/L (ref 98–111)
Creatinine, Ser: 0.57 mg/dL (ref 0.44–1.00)
GFR calc Af Amer: >60 mL/min (ref >60)
GFR calc non Af Amer: >60 mL/min (ref >60)
Glucose, Bld: 97 mg/dL (ref 70–99)
Potassium: 3.3 mmol/L — ABNORMAL LOW (ref 3.5–5.1)
Sodium: 140 mmol/L (ref 135–145)
Total Bilirubin: 0.6 mg/dL (ref 0.3–1.2)
Total Protein: 7.2 g/dL (ref 6.5–8.1)

## 2019-04-30 LAB — POCT PREGNANCY, URINE: Preg Test, Ur: NEGATIVE

## 2019-04-30 LAB — LIPASE, BLOOD: Lipase: 32 U/L (ref 11–51)

## 2019-04-30 MED ORDER — IOHEXOL 300 MG/ML  SOLN
75.0000 mL | Freq: Once | INTRAMUSCULAR | Status: AC | PRN
  Administered 2019-04-30: 75 mL via INTRAVENOUS

## 2019-04-30 MED ORDER — SODIUM CHLORIDE 0.9% FLUSH: 3.0000 mL | Freq: Once | INTRAVENOUS | Status: DC

## 2019-04-30 MED ORDER — HALOPERIDOL LACTATE 5 MG/ML IJ SOLN
5.0000 mg | Freq: Once | INTRAMUSCULAR | Status: AC
  Administered 2019-04-30: 5 mg via INTRAVENOUS
  Filled 2019-04-30: qty 1

## 2019-04-30 MED ORDER — POTASSIUM CHLORIDE CRYS ER 20 MEQ PO TBCR
40.0000 meq | EXTENDED_RELEASE_TABLET | Freq: Once | ORAL | Status: AC
  Administered 2019-04-30: 40 meq via ORAL
  Filled 2019-04-30: qty 2

## 2019-04-30 NOTE — ED Provider Notes (Signed)
-----------------------------------------   10:42 PM on 04/30/2019 -----------------------------------------  With chronic recurrent abdominal pain, seen by the PA, patient declines pelvic exam has had innumerable and chronic work-ups for this.  Her CT scan was ordered by the PA as a precaution which is negative.  After Haldol although patient symptoms resolved and she is eager to be discharged.  Declines further care and would like to call for her ride.  Is tolerating p.o. with no difficulty.  Abdomen is benign.   Schuyler Amor, MD 04/30/19 [phone]

## 2019-04-30 NOTE — ED Notes (Signed)
Pt ambulated to toilet with steady gait

## 2019-04-30 NOTE — ED Triage Notes (Signed)
Pt to ED via POV c/o suprapubic abdominal pain since 0300. Pt states that she is also vomiting. Pt denies diarrhea. Pt is in NAD at this time.

## 2019-04-30 NOTE — ED Notes (Signed)
Pt ambulatory to registration desk to ask for warm blanket, blanket given.

## 2019-04-30 NOTE — ED Provider Notes (Signed)
[facility] Emergency Department Provider Note  ____________________________________________  Time seen: Approximately 8:32 PM  I have reviewed the triage vital signs and the nursing notes.   HISTORY  Chief Complaint Abdominal Pain    HPI Hannah Shaw is a 23 y.o. female with PMH endometriosis that presents to the emergency department for evaluation of vomiting and centralized low abdominal pain since this morning. Patient has had several episodes of vomiting today. No concern for STD. No fever, diarrhea, constipation.    Past Medical History:  Diagnosis Date  . Asthma    WELL CONTROLLED  . Chronic kidney disease    H/O STONES  . Endometriosis   . GERD (gastroesophageal reflux disease)   . Headache   . Hypothyroidism   . Thyroid disease     There are no active problems to display for this patient.   Past Surgical History:  Procedure Laterality Date  . APPENDECTOMY    . ENDOMETRIAL BIOPSY    . LAPAROSCOPY N/A 03/13/2016   Procedure: LAPAROSCOPY DIAGNOSTIC;  Surgeon: Honor Loh Ward, MD;  Location: [facility] ORS;  Service: Gynecology;  Laterality: N/A;    Prior to Admission medications   Medication Sig Start Date End Date Taking? Authorizing Provider  clonazePAM (KLONOPIN) 1 MG tablet Take 45 mg by mouth 2 (two) times daily as needed.     [provider]  desogestrel-ethinyl estradiol (MIRCETTE) 0.15-0.02/0.01 MG (21/5) tablet Take 1 tablet by mouth at bedtime. 12/20/18   Jonnie Kind, MD  doxycycline (VIBRAMYCIN) 100 MG capsule Take 1 capsule (100 mg total) by mouth 2 (two) times daily. Patient not taking: Reported on 04/05/2019 03/05/19   Earleen Newport, MD  Elagolix Sodium 200 MG TABS Take 200 mg by mouth 2 (two) times a day. Patient not taking: Reported on 04/05/2019 02/16/19   Jonnie Kind, MD  FLUoxetine (PROZAC) 20 MG capsule Take 1 capsule by mouth daily. 08/27/16   [provider]  ibuprofen (ADVIL,MOTRIN) 600 MG  tablet Take 1 tablet (600 mg total) by mouth every 6 (six) hours as needed. Patient not taking: Reported on 04/12/2019 12/20/18   Jonnie Kind, MD  ketorolac (TORADOL) 10 MG tablet Take 1 tablet (10 mg total) by mouth every 8 (eight) hours. Patient not taking: Reported on 03/09/2019 02/21/19   Menshew, Dannielle Karvonen, PA-C  levothyroxine (SYNTHROID, LEVOTHROID) 25 MCG tablet Take 25 mcg by mouth daily before breakfast.     [provider]  meloxicam (MOBIC) 15 MG tablet Take 1 tablet (15 mg total) by mouth daily. Patient not taking: Reported on 12/01/2018 08/04/18   Cuthriell, Charline Bills, PA-C  methocarbamol (ROBAXIN) 500 MG tablet Take 1 tablet (500 mg total) by mouth 4 (four) times daily. Patient not taking: Reported on 12/01/2018 08/04/18   Cuthriell, Charline Bills, PA-C  metroNIDAZOLE (FLAGYL) 500 MG tablet Take 1 tablet (500 mg total) by mouth 2 (two) times daily. Patient not taking: Reported on 03/09/2019 03/05/19   Earleen Newport, MD  norethindrone (MICRONOR,CAMILA,ERRIN) 0.35 MG tablet Take 1 tablet (0.35 mg total) by mouth daily. Patient not taking: Reported on 04/05/2019 11/02/18   Harlin Heys, MD  ondansetron (ZOFRAN ODT) 4 MG disintegrating tablet Take 1 tablet (4 mg total) by mouth every 8 (eight) hours as needed. Patient not taking: Reported on 04/12/2019 02/21/19   Menshew, Dannielle Karvonen, PA-C  oxyCODONE-acetaminophen (PERCOCET) 5-325 MG tablet Take 1 tablet by mouth every 8 (eight) hours as needed. Patient not taking: Reported  on 04/12/2019 03/05/19   Earleen Newport, MD  oxyCODONE-acetaminophen (PERCOCET) 7.5-325 MG tablet Take 1 tablet by mouth every 6 (six) hours as needed for severe pain. Patient not taking: Reported on 04/05/2019 01/26/19   Jonnie Kind, MD    Allergies Morphine and related, Fentanyl, Betadine [povidone iodine], and Iodine  Family History  Problem Relation Age of Onset  . Cervical cancer Mother   . Lung cancer Paternal Uncle     Social  History Social History   Tobacco Use  . Smoking status: Current Some Day Smoker    Packs/day: 0.25    Types: Cigarettes  . Smokeless tobacco: Never Used  Substance Use Topics  . Alcohol use: No  . Drug use: No     Review of Systems  Constitutional: No fever/chills ENT: No upper respiratory complaints. Cardiovascular: No chest pain. Respiratory: No cough. No SOB. Gastrointestinal: Low centralized abdominal pain.  No nausea, no vomiting.  Genitourinary: Negative for dysuria. Musculoskeletal: Negative for musculoskeletal pain. Skin: Negative for rash, abrasions, lacerations, ecchymosis. Neurological: Negative for headaches, numbness or tingling   ____________________________________________   PHYSICAL EXAM:  VITAL SIGNS: ED Triage Vitals [04/30/19 1659]  Enc Vitals Group     BP 111/78     Pulse Rate 88     Resp 16     Temp 99.4 F (37.4 C)     Temp Source Oral     SpO2 98 %     Weight 96 lb (43.5 kg)     Height 5\' 2"  (1.575 m)     Head Circumference      Peak Flow      Pain Score 10     Pain Loc      Pain Edu?      Excl. in [place]?      Constitutional: Alert and oriented. Well appearing and in no acute distress. Eyes: Conjunctivae are normal. PERRL. EOMI. Head: Atraumatic. ENT:      Ears:      Nose: No congestion/rhinnorhea.      Mouth/Throat: Mucous membranes are moist.  Neck: No stridor. Cardiovascular: Normal rate, regular rhythm.  Good peripheral circulation. Respiratory: Normal respiratory effort without tachypnea or retractions. Lungs CTAB. Good air entry to the bases with no decreased or absent breath sounds. Gastrointestinal: Bowel sounds 4 quadrants. Mild suprapubic tenderness. No guarding or rigidity. No palpable masses. No distention.  Musculoskeletal: Full range of motion to all extremities. No gross deformities appreciated. Neurologic:  Normal speech and language. No gross focal neurologic deficits are appreciated.  Skin:  Skin is warm, dry and  intact. No rash noted. Psychiatric: Mood and affect are normal. Speech and behavior are normal. Patient exhibits appropriate insight and judgement.   ____________________________________________   LABS (all labs ordered are listed, but only abnormal results are displayed)  Labs Reviewed  COMPREHENSIVE METABOLIC PANEL - Abnormal; Notable for the following components:      Result Value   Potassium 3.3 (*)    CO2 21 (*)    All other components within normal limits  CBC - Abnormal; Notable for the following components:   RDW 11.1 (*)    All other components within normal limits  URINALYSIS, COMPLETE (UACMP) WITH MICROSCOPIC - Abnormal; Notable for the following components:   Color, Urine YELLOW (*)    APPearance HAZY (*)    Hgb urine dipstick MODERATE (*)    Leukocytes,Ua TRACE (*)    Bacteria, UA RARE (*)    All other components within normal  limits  LIPASE, BLOOD  POC URINE PREG, ED  POCT PREGNANCY, URINE   ____________________________________________  EKG   ____________________________________________  RADIOLOGY   No results found.  ____________________________________________    PROCEDURES  Procedure(s) performed:    Procedures    Medications  sodium chloride flush (NS) 0.9 % injection 3 mL (has no administration in time range)     ____________________________________________   INITIAL IMPRESSION / ASSESSMENT AND PLAN / ED COURSE  Pertinent labs & imaging results that were available during my care of the patient were reviewed by me and considered in my medical decision making (see chart for details).  Review of the [place] CSRS was performed in accordance of the [employer] prior to dispensing any controlled drugs.     Patient presented to the emergency department for evaluation of low centralized abdominal pain today with vomiting.  Vital signs, lab work are reassuring.  Patient does not have a white blood cell count.  She does have a low-grade fever.  CT  scan was ordered as a precaution, although her abdominal exam is benign.  Patient case was discussed with Dr. Burlene Arnt who will follow-up with CT scan results.   Hannah Shaw was evaluated in Emergency Department on 04/30/2019 for the symptoms described in the history of present illness. She was evaluated in the context of the global COVID-19 pandemic, which necessitated consideration that the patient might be at risk for infection with the SARS-CoV-2 virus that causes COVID-19. Institutional protocols and algorithms that pertain to the evaluation of patients at risk for COVID-19 are in a state of rapid change based on information released by regulatory bodies including the CDC and federal and state organizations. These policies and algorithms were followed during the patient's care in the ED.   ____________________________________________  FINAL CLINICAL IMPRESSION(S) / ED DIAGNOSES  Final diagnoses:  None      NEW MEDICATIONS STARTED DURING THIS VISIT:  ED Discharge Orders    None          This chart was dictated using voice recognition software/Dragon. Despite best efforts to proofread, errors can occur which can change the meaning. Any change was purely unintentional.    Laban Emperor, PA-C 05/01/19 1540    Burlene Arnt Gerda Diss, MD 05/15/19 [phone]

## 2019-04-30 NOTE — ED Notes (Signed)
Pt requesting pain medication rx - advised with generalized chronic abd pain we suggest tylenol or motrin. Pt's mother got on the phone speaker and requesting her daughter to have pain medications - advised her the same

## 2019-04-30 NOTE — ED Notes (Addendum)
Pt back to room 10 and bp cuff and pulse ox applied. Pt has hx of chronic abd pain from endometriosis per pt. She states her ob gyn has no plan for this problem. Lab work and urine have resulted, awaiting dr assessment

## 2019-05-01 ENCOUNTER — Telehealth: Payer: Self-pay | Admitting: Obstetrics and Gynecology

## 2019-05-01 NOTE — Telephone Encounter (Signed)
Unable to reach pt with restrictions. No VM.

## 2019-05-02 ENCOUNTER — Ambulatory Visit (INDEPENDENT_AMBULATORY_CARE_PROVIDER_SITE_OTHER): Payer: Medicare Other | Admitting: Obstetrics and Gynecology

## 2019-05-02 ENCOUNTER — Other Ambulatory Visit: Payer: Self-pay

## 2019-05-02 ENCOUNTER — Encounter: Payer: Self-pay | Admitting: Obstetrics and Gynecology

## 2019-05-02 VITALS — BP 99/68 | HR 94

## 2019-05-02 DIAGNOSIS — F819 Developmental disorder of scholastic skills, unspecified: Secondary | ICD-10-CM

## 2019-05-02 DIAGNOSIS — R482 Apraxia: Secondary | ICD-10-CM

## 2019-05-02 DIAGNOSIS — N946 Dysmenorrhea, unspecified: Secondary | ICD-10-CM | POA: Diagnosis not present

## 2019-05-02 NOTE — Progress Notes (Signed)
[facility] Visit  @DATE @            Patient name: Hannah Shaw MRN [id number]  Date of birth: Oct 24, 1996  CC & HPI:  Hannah Shaw is a 23 y.o. female presenting today for follow-up of her chronic complaints of lower abdominal pain below the umbilicus.  She is currently on day 3 of her menses.  She describes the pain as being located just inferior to the umbilicus and going toward her ovaries,, at which time she motions toward her anterior superior iliac crest not toward her pelvis.  She is not sexually active in 6 months, not on birth control pills.  As per the previous visits we have been trying to sort out whether there is any possibility of endometriosis or other GYN etiology to her pain she allegedly has had 2 laparoscopies.  I previously reviewed the records from her laparoscopy performed in [facility] which described to minimal areas of endometriosis on the cul-de-sac, with no mention of endometriosis or adhesions on the ovaries.,  No evidence of PID. She was seen for abdominal pain at [facility] on July 5 which was the first day of her menstrual period When seen in the emergency room 04/30/2019, the the staff there was under the impression the patient was not taking her elagolix which is an empiric therapy for possible endometriosis, and the patient states today that she is indeed taking it "like it said" which is in her reports every day.  The medicine is to be taken actually twice daily.  ROS:  ROS Patient reports she throws up with the pain.,  She reports she threw up earlier today Last BM "earlier today" patient reports she has had 2 BMs today and normally has 3/day  Patient denies that she takes any OTC medicines for her menstrual discomfort "just the ones you gave me"  Of note, a sed rate performed 03/15/2019 was 2, very low, which in my mind rules PID and most chronic inflammatory processes In attempting to assess her pain, I described a pain level of 10 is equal  to having a toenail ripped off..  During the GYN exam I asked her to compare the pain on a scale of 1-10 with this frame of reference.   Pertinent History Reviewed:   Reviewed: Significant for patient reports she has chronic pain which is a 8 out of 10 on a baseline Medical         Past Medical History:  Diagnosis Date  . Asthma    WELL CONTROLLED  . Chronic kidney disease    H/O STONES  . Endometriosis   . GERD (gastroesophageal reflux disease)   . Headache   . Hypothyroidism   . Thyroid disease                               Surgical Hx:    Past Surgical History:  Procedure Laterality Date  . APPENDECTOMY    . ENDOMETRIAL BIOPSY    . LAPAROSCOPY N/A 03/13/2016   Procedure: LAPAROSCOPY DIAGNOSTIC;  Surgeon: Honor Loh Ward, MD;  Location: [facility] ORS;  Service: Gynecology;  Laterality: N/A;   Medications: Reviewed & Updated - see associated section                       Current Outpatient Medications:  .  clonazePAM (KLONOPIN) 1 MG tablet, Take 45 mg by mouth 2 (  two) times daily as needed. , Disp: , Rfl:  .  desogestrel-ethinyl estradiol (MIRCETTE) 0.15-0.02/0.01 MG (21/5) tablet, Take 1 tablet by mouth at bedtime., Disp: 1 Package, Rfl: 6 .  levothyroxine (SYNTHROID, LEVOTHROID) 25 MCG tablet, Take 25 mcg by mouth daily before breakfast. , Disp: , Rfl:  .  meloxicam (MOBIC) 15 MG tablet, Take 1 tablet (15 mg total) by mouth daily., Disp: 30 tablet, Rfl: 0 .  oxyCODONE-acetaminophen (PERCOCET) 7.5-325 MG tablet, Take 1 tablet by mouth every 6 (six) hours as needed for severe pain., Disp: 30 tablet, Rfl: 0 .  Elagolix Sodium 200 MG TABS, Take 200 mg by mouth 2 (two) times a day. (Patient not taking: Reported on 04/05/2019), Disp: 60 tablet, Rfl: 11 .  ibuprofen (ADVIL,MOTRIN) 600 MG tablet, Take 1 tablet (600 mg total) by mouth every 6 (six) hours as needed. (Patient not taking: Reported on 05/02/2019), Disp: 30 tablet, Rfl: 0 .  methocarbamol (ROBAXIN) 500 MG tablet, Take 1 tablet (500 mg  total) by mouth 4 (four) times daily. (Patient not taking: Reported on 12/01/2018), Disp: 16 tablet, Rfl: 0   Social History: Reviewed -  reports that she has been smoking cigarettes. She has been smoking about 0.25 packs per day. She has never used smokeless tobacco.  Objective Findings:  Vitals: Blood pressure 99/68, pulse 94, last menstrual period 04/02/2019.  Normal respiratory rate no tachypnea  PHYSICAL EXAMINATION General appearance - oriented to person, place, and time and underweight Mental status - normal mood, behavior, speech, dress, motor activity,, with slow thought processes Chest - clear to auscultation, no wheezes, rales or rhonchi, symmetric air entry Heart - normal rate and regular rhythm Abdomen - soft, nontender, nondistended, no masses or organomegaly Bowel sounds are essentially normal occasional bowel sounds noted no increased activity.  Patient is asked to tighten her abdomen and yet complains that her lower abdomen is tender to light touch.  When asked to relax her abdomen so that we could palpate further there is guarding but without any facial expressions.  There is no rebound Breasts -  Skin - normal coloration and turgor, no rashes, no suspicious skin lesions noted  PELVIC External genitalia -normal in appearance no muscle spasm or guarding Vulva -nontender to touch Vagina -digital exam touching the vaginal sidewall is described as an 8 out of 10, light contact over the bladder area is described as a 9 out of 10.  There is light menstrual blood present the cervix is nonpurulent Cervix -nonpurulent small tiny nulliparous light amount of menstrual blood present of dark character contact of the cervix does not produce any guarding, respiratory rate change her other suggestion of pain Uterus -mobile described as painful but not associated with any secondary muscle tightening in response to the alleged pain  Adnexa -no masses guarding or rebound [place] -not  done Rectal -not done    Assessment & Plan:   A:  1.  Dysmenorrhea 2,  verbal apraxia 3.  Learning disability 4.  Noncompliance with medical therapies 1. History of chronic lower abdominal/pelvic pain without corroborating physical findings 2. Laparoscopic confirmation of minimal endometriosis as a teenager 3. If the patient is indeed taking her elagolix properly and she does not have relief by August 1 will put her on continuous low-dose OCPs.  I am concerned about this patient's compliance, and her ability to comply as well as her ability to accurately report what she is really doing. 4.   P:  1. I will check with  [employer] to see what medicines of actually been acquired to see if the elagolix is actually being taken.  I have confirmed with the pharmacist at [facility] that the patient did pick up a single prescription of elagolix 200 mg twice daily in April with no refills since that time.  There is a  0% likelihood the patient is taking the medicines accurately or even as she has described. 2. Furthermore, the pharmacist at [facility] reports that the patient has not been getting her Synthroid filled regularly since the March diagnosis of hyperthyroidism at [facility] I have asked the patient to try some take Motrin to see if she can get adequate pain relief to feel like she is receiving some help, but I will not be writing opiates in this patient's care. 3.  I have offered referral to [facility], or to [facility], for their opinions however I do not consider surgical interventions warranted at this time, and we are attempting to maximize medical empiric therapy with elagolix at present without success or compliance.  Follow-up by phone in 1 month patient agrees to me referring her to [facility] or [facility] for their opinions.  The provider spent over 35 minutes with the visit , including previsit review, and documentation,with >than 50% spent in counseling and coordination of care.

## 2019-05-10 ENCOUNTER — Telehealth: Payer: Self-pay | Admitting: Obstetrics and Gynecology

## 2019-05-10 NOTE — Telephone Encounter (Signed)
Was suppose to hear by this week from [facility] about [facility].  Ferg told her to call him if she had not heard from [place]

## 2019-05-11 NOTE — Telephone Encounter (Signed)
Pt calling to check status on hearing back from Dr. Glo Herring.

## 2019-05-14 ENCOUNTER — Emergency Department: Payer: Medicare Other

## 2019-05-14 ENCOUNTER — Other Ambulatory Visit: Payer: Self-pay

## 2019-05-14 ENCOUNTER — Emergency Department
Admission: EM | Admit: 2019-05-14 | Discharge: 2019-05-14 | Disposition: A | Discharge status: Home / Self Care | Payer: Medicare Other | Attending: Emergency Medicine | Admitting: Emergency Medicine

## 2019-05-14 DIAGNOSIS — Z20822 Contact with and (suspected) exposure to covid-19: Secondary | ICD-10-CM

## 2019-05-14 DIAGNOSIS — J45909 Unspecified asthma, uncomplicated: Secondary | ICD-10-CM | POA: Insufficient documentation

## 2019-05-14 DIAGNOSIS — R0602 Shortness of breath: Secondary | ICD-10-CM | POA: Diagnosis not present

## 2019-05-14 DIAGNOSIS — R05 Cough: Secondary | ICD-10-CM | POA: Diagnosis not present

## 2019-05-14 DIAGNOSIS — N189 Chronic kidney disease, unspecified: Secondary | ICD-10-CM | POA: Diagnosis not present

## 2019-05-14 DIAGNOSIS — M791 Myalgia, unspecified site: Secondary | ICD-10-CM | POA: Diagnosis not present

## 2019-05-14 DIAGNOSIS — Z20828 Contact with and (suspected) exposure to other viral communicable diseases: Secondary | ICD-10-CM | POA: Diagnosis not present

## 2019-05-14 DIAGNOSIS — E039 Hypothyroidism, unspecified: Secondary | ICD-10-CM | POA: Insufficient documentation

## 2019-05-14 DIAGNOSIS — Z79899 Other long term (current) drug therapy: Secondary | ICD-10-CM | POA: Insufficient documentation

## 2019-05-14 DIAGNOSIS — F17210 Nicotine dependence, cigarettes, uncomplicated: Secondary | ICD-10-CM | POA: Diagnosis not present

## 2019-05-14 DIAGNOSIS — R059 Cough, unspecified: Secondary | ICD-10-CM

## 2019-05-14 DIAGNOSIS — R509 Fever, unspecified: Secondary | ICD-10-CM | POA: Diagnosis present

## 2019-05-14 MED ORDER — ALBUTEROL SULFATE HFA 108 (90 BASE) MCG/ACT IN AERS: 2.0000 | INHALATION_SPRAY | RESPIRATORY_TRACT | 0 refills | Status: DC | PRN

## 2019-05-14 MED ORDER — KETOROLAC TROMETHAMINE 30 MG/ML IJ SOLN: 60.0000 mg | Freq: Once | INTRAMUSCULAR | Status: DC

## 2019-05-14 MED ORDER — KETOROLAC TROMETHAMINE 30 MG/ML IJ SOLN
15.0000 mg | Freq: Once | INTRAMUSCULAR | Status: AC
  Administered 2019-05-14: 15 mg via INTRAVENOUS
  Filled 2019-05-14: qty 1

## 2019-05-14 NOTE — ED Provider Notes (Signed)
[facility] Emergency Department Provider Note   ____________________________________________   First MD Initiated Contact with Patient 05/14/19 [phone]     (approximate)  I have reviewed the triage vital signs and the nursing notes.   HISTORY  Chief Complaint Generalized Body Aches    HPI JERMANI PUND is a 23 y.o. female who presents to the ED from home with a chief complaint of fever, cough, shortness of breath and generalized body aches. Symptoms x2 days.  Family member awaiting COVID results.  Denies abdominal pain, nausea, vomiting or diarrhea.  Denies recent travel or trauma.       Past Medical History:  Diagnosis Date  . Asthma    WELL CONTROLLED  . Chronic kidney disease    H/O STONES  . Endometriosis   . GERD (gastroesophageal reflux disease)   . Headache   . Hypothyroidism   . Thyroid disease     Patient Active Problem List   Diagnosis Date Noted  . 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 APPENDECTOMY    . ENDOMETRIAL BIOPSY    . LAPAROSCOPY N/A 03/13/2016   Procedure: LAPAROSCOPY DIAGNOSTIC;  Surgeon: Honor Loh Ward, MD;  Location: [facility] ORS;  Service: Gynecology;  Laterality: N/A;    Prior to Admission medications   Medication Sig Start Date End Date Taking? Authorizing Provider  albuterol (VENTOLIN HFA) 108 (90 Base) MCG/ACT inhaler Inhale 2 puffs into the lungs every 4 (four) hours as needed for wheezing or shortness of breath. 05/14/19   Paulette Blanch, MD  clonazePAM (KLONOPIN) 1 MG tablet Take 45 mg by mouth 2 (two) times daily as needed.     [provider]  desogestrel-ethinyl estradiol (MIRCETTE) 0.15-0.02/0.01 MG (21/5) tablet Take 1 tablet by mouth at bedtime. 12/20/18   Jonnie Kind, MD  Elagolix Sodium 200 MG TABS Take 200 mg by mouth 2 (two) times a day. Patient not taking: Reported on 04/05/2019 02/16/19   Jonnie Kind, MD  ibuprofen (ADVIL,MOTRIN) 600 MG tablet Take 1 tablet (600 mg total) by mouth every 6 (six) hours as needed. Patient not taking: Reported on 05/02/2019 12/20/18   Jonnie Kind, MD  levothyroxine (SYNTHROID, LEVOTHROID) 25 MCG tablet Take 25 mcg by mouth daily before breakfast.     [provider]  meloxicam (MOBIC) 15 MG tablet Take 1 tablet (15 mg total) by mouth daily. 08/04/18   Cuthriell, Charline Bills, PA-C  methocarbamol (ROBAXIN) 500 MG tablet Take 1 tablet (500 mg total) by mouth 4 (four) times daily. Patient not taking: Reported on 12/01/2018 08/04/18   Cuthriell, Charline Bills, PA-C  oxyCODONE-acetaminophen (PERCOCET) 7.5-325 MG tablet Take 1 tablet by mouth every 6 (six) hours as needed for severe pain. 01/26/19   Jonnie Kind, MD    Allergies Morphine and related, Fentanyl, Betadine [povidone iodine], and Iodine  Family History  Problem Relation Age of Onset  . Cervical cancer Mother   . Lung cancer Paternal Uncle     Social History Social History   Tobacco Use  . Smoking status: Current Some Day Smoker    Packs/day: 0.25    Types: Cigarettes  . Smokeless tobacco: Never Used  Substance Use Topics  . Alcohol use: No  . Drug use: No    Review of Systems  Constitutional: Positive for fever/chills.  Positive for myalgias. Eyes: No visual changes. ENT: No sore throat. Cardiovascular: Denies  chest pain. Respiratory: Positive for cough and shortness of breath. Gastrointestinal: No abdominal pain.  No nausea, no vomiting.  No diarrhea.  No constipation. Genitourinary: Negative for dysuria. Musculoskeletal: Negative for back pain. Skin: Negative for rash. Neurological: Negative for headaches, focal weakness or numbness.   ____________________________________________   PHYSICAL EXAM:  VITAL SIGNS: ED Triage Vitals  Enc Vitals Group     BP 05/14/19 0115 113/69     Pulse Rate 05/14/19 0115 96     Resp 05/14/19 0115 20     Temp 05/14/19 0115 100  F (37.8 C)     Temp Source 05/14/19 0115 Oral     SpO2 05/14/19 0115 100 %     Weight 05/14/19 0111 96 lb (43.5 kg)     Height 05/14/19 0111 5\' 2"  (1.575 m)     Head Circumference --      Peak Flow --      Pain Score 05/14/19 0111 10     Pain Loc --      Pain Edu? --      Excl. in [place]? --     Constitutional: Alert and oriented. Well appearing and in no acute distress. Eyes: Conjunctivae are normal. PERRL. EOMI. Head: Atraumatic. Nose: No congestion/rhinnorhea. Mouth/Throat: Mucous membranes are moist.  Oropharynx non-erythematous. Neck: No stridor.  Supple neck without meningismus. Cardiovascular: Normal rate, regular rhythm. Grossly normal heart sounds.  Good peripheral circulation. Respiratory: Normal respiratory effort.  No retractions. Lungs CTAB.  Dry cough noted. Gastrointestinal: Soft and nontender. No distention. No abdominal bruits. No CVA tenderness. Musculoskeletal: No lower extremity tenderness nor edema.  No joint effusions. Neurologic:  Normal speech and language. No gross focal neurologic deficits are appreciated. No gait instability. Skin:  Skin is warm, dry and intact. No rash noted.  No petechiae. Psychiatric: Mood and affect are normal. Speech and behavior are normal.  ____________________________________________   LABS (all labs ordered are listed, but only abnormal results are displayed)  Labs Reviewed  NOVEL CORONAVIRUS, NAA (HOSPITAL ORDER, SEND-OUT TO REF LAB)   ____________________________________________  EKG  None ____________________________________________  RADIOLOGY  ED MD interpretation: No acute cardiopulmonary process  Official radiology report(s): Dg Chest Port 1 View  Result Date: 05/14/2019 CLINICAL DATA:  Shortness of breath, cough and chest pain since Friday. EXAM: PORTABLE CHEST 1 VIEW COMPARISON:  Chest x-rays dated 06/22/2017 and 10/14/2016. FINDINGS: Heart size and mediastinal contours are within normal limits. Lungs are  clear. No pleural effusions seen. Osseous structures are unremarkable. IMPRESSION: No active disease. No evidence of pneumonia or pulmonary edema. Electronically Signed   By: Franki Cabot M.D.   On: 05/14/2019 06:38    ____________________________________________   PROCEDURES  Procedure(s) performed (including Critical Care):  Procedures   ____________________________________________   INITIAL IMPRESSION / ASSESSMENT AND PLAN / ED COURSE  As part of my medical decision making, I reviewed the following data within the [place] notes reviewed and incorporated, Old chart reviewed, Radiograph reviewed and Notes from prior ED visits     MAHLET JERGENS was evaluated in Emergency Department on 05/14/2019 for the symptoms described in the history of present illness. She was evaluated in the context of the global COVID-19 pandemic, which necessitated consideration that the patient might be at risk for infection with the SARS-CoV-2 virus that causes COVID-19. Institutional protocols and algorithms that pertain to the evaluation of patients at risk for COVID-19 are in a state of rapid change based on information released by regulatory bodies  including the CDC and federal and state organizations. These policies and algorithms were followed during the patient's care in the ED.   23 year old female who presents with fever, cough, myalgias and shortness of breath.  Family member awaiting COVID results.  Will obtain chest x-ray, send out COVID swab; Toradol for myalgias.  Clinical Course as of May 13 646  Sun May 14, 2019  1102 Unremarkable chest x-ray.  Will discharge home with prescription for albuterol inhaler to use as needed.  Send out COVID pending.  Strict return precautions given.  Patient verbalizes understanding agrees with plan of care.   [JS]    Clinical Course User Index [JS] Paulette Blanch, MD     ____________________________________________   FINAL  CLINICAL IMPRESSION(S) / ED DIAGNOSES  Final diagnoses:  Suspected Covid-19 Virus Infection  Cough  Myalgia     ED Discharge Orders         Ordered    albuterol (VENTOLIN HFA) 108 (90 Base) MCG/ACT inhaler  Every 4 hours PRN     05/14/19 0645           Note:  This document was prepared using Dragon voice recognition software and may include unintentional dictation errors.   Paulette Blanch, MD 05/14/19 [phone]

## 2019-05-14 NOTE — ED Notes (Signed)
Patient to lobby via wheelchair by EMS.  Per EMS patient states not feeling will with flu like sympotms - generalized body aches, chills.  Has been exposed to family member who has been tested (no results as yet) for COVID.  Patient with 18G angiocath to left hand, received 500 ml normal saline bolus, received zofran 4 mg via IV, CBG 111.

## 2019-05-14 NOTE — ED Triage Notes (Signed)
Patient to ED via EMS from home with complaints of generalized body aches.  Patient has had exposure to family member who is awaiting results for COVID tes.

## 2019-05-14 NOTE — ED Notes (Signed)
Patient reports had tylenol and ibuprofen approximately 1 hour prior to arrival.

## 2019-05-14 NOTE — Discharge Instructions (Addendum)
Your COVID test is pending.  You will be called with any positive results.  You may use Albuterol inhaler 2 puffs every 4 hours as needed for cough/difficulty breathing.  Return to the ER for worsening symptoms, persistent vomiting, difficulty breathing or other concerns.

## 2019-05-16 ENCOUNTER — Telehealth: Payer: Self-pay | Admitting: Obstetrics and Gynecology

## 2019-05-16 NOTE — Telephone Encounter (Signed)
Spoke w/pt and reminded her that she has a tele visit, not in person.

## 2019-05-17 ENCOUNTER — Other Ambulatory Visit: Payer: Self-pay

## 2019-05-17 ENCOUNTER — Encounter: Payer: Self-pay | Admitting: Obstetrics and Gynecology

## 2019-05-17 ENCOUNTER — Ambulatory Visit (INDEPENDENT_AMBULATORY_CARE_PROVIDER_SITE_OTHER): Payer: Medicare Other | Admitting: Obstetrics and Gynecology

## 2019-05-17 VITALS — Ht 62.0 in

## 2019-05-17 DIAGNOSIS — N946 Dysmenorrhea, unspecified: Secondary | ICD-10-CM | POA: Diagnosis not present

## 2019-05-17 DIAGNOSIS — Z8742 Personal history of other diseases of the female genital tract: Secondary | ICD-10-CM

## 2019-05-17 LAB — NOVEL CORONAVIRUS, NAA (HOSP ORDER, SEND-OUT TO REF LAB; TAT 18-24 HRS): SARS-CoV-2, NAA: NOT DETECTED

## 2019-05-17 MED ORDER — NORETHIN-ETH ESTRAD-FE BIPHAS 1 MG-10 MCG / 10 MCG PO TABS: 1.0000 | ORAL_TABLET | Freq: Every day | ORAL | 3 refills | Status: DC

## 2019-05-17 NOTE — Progress Notes (Signed)
Patient ID: Hannah Shaw, female   DOB: [birth year], 23 y.o.   MRN: [id number]    TELEHEALTH VIRTUAL GYNECOLOGY VISIT ENCOUNTER NOTE  I connected with Hannah Shaw on 05/17/2019 at  9:15 AM EDT by telephone at home and verified that I am speaking with the correct person using two identifiers.   I discussed the limitations, risks, security and privacy concerns of performing an evaluation and management service by telephone and the availability of in person appointments. I also discussed with the patient that there may be a patient responsible charge related to this service. The patient expressed understanding and agreed to proceed.   History:  Hannah Shaw is a 23 y.o. [id number] female being evaluated today for f/u for abdominal pain. Was given elagolix. Says she is taking medicine, is still having periods. Says that  Cramps are painful and just lies around the house. Unsure of how many days she lies around the house. Says she takes ibuprofen for pain but doesn't help. Says pain is so bad that she can't move. Says that she has pain even when she isn't on her period. Says that she doesn't know if she would have better quality of life if her period pain would cease. She denies any abnormal vaginal discharge, bleeding, pelvic pain or other concerns.       Past Medical History:  Diagnosis Date  . Asthma    WELL CONTROLLED  . Chronic kidney disease    H/O STONES  . Endometriosis   . GERD (gastroesophageal reflux disease)   . Headache   . Hypothyroidism   . Thyroid disease    Past Surgical History:  Procedure Laterality Date  . APPENDECTOMY    . ENDOMETRIAL BIOPSY    . LAPAROSCOPY N/A 03/13/2016   Procedure: LAPAROSCOPY DIAGNOSTIC;  Surgeon: Honor Loh Ward, MD;  Location: [facility] ORS;  Service: Gynecology;  Laterality: N/A;   The following portions of the patient's history were reviewed and updated as appropriate: allergies, current medications, past family history, past medical history, past  social history, past surgical history and problem list.    Review of Systems:  Pertinent items noted in HPI and remainder of comprehensive ROS otherwise negative.  Physical Exam:   General:  Alert, oriented and cooperative.   Mental Status: Normal mood and affect perceived. Normal judgment and thought content.  Physical exam deferred due to nature of the encounter  Labs and Imaging No results found for this or any previous visit (from the past 336 hour(s)). Ct Abdomen Pelvis W Contrast  Result Date: 04/30/2019 CLINICAL DATA:  Abdominal pain today. Vomiting. EXAM: CT ABDOMEN AND PELVIS WITH CONTRAST TECHNIQUE: Multidetector CT imaging of the abdomen and pelvis was performed using the standard protocol following bolus administration of intravenous contrast. CONTRAST:  54mL OMNIPAQUE IOHEXOL 300 MG/ML  SOLN COMPARISON:  Pelvic ultrasound 03/05/2019. Noncontrast CT 06/29/2018 FINDINGS: Lower chest: Lung bases are clear. Hepatobiliary: Fatty infiltration adjacent to the falciform ligament. No focal hepatic abnormality. Gallbladder physiologically distended. No calcified gallstone. No pericholecystic inflammation. Slight intrahepatic biliary prominence of left bile duct, likely incidental with normal common bile duct. Pancreas: No ductal dilatation or inflammation. Spleen: Tiny hypodensity in the central spleen, too small to characterize but typically benign. Spleen is normal in size. Large splenule inferiorly. Adrenals/Urinary Tract: Normal adrenal glands No hydronephrosis or perinephric edema. Homogeneous renal enhancement. Urinary bladder is near completely empty. Stomach/Bowel: Lack of enteric contrast and paucity of intra-abdominal fat limits detailed bowel assessment. No evidence of obstruction,  bowel inflammation, or wall thickening. Appendix not visualized, prior appendectomy per history. Vascular/Lymphatic: Normal caliber abdominal aorta. Portal vein is patent. Mesenteric vessels are patent. No  adenopathy. Reproductive: Small cystic structure in the region of the cervix is likely nabothian cyst. Uterus is otherwise unremarkable. No adnexal mass. Other: No free air, free fluid, or intra-abdominal fluid collection. Tiny fat containing umbilical hernia. Musculoskeletal: There are no acute or suspicious osseous abnormalities. IMPRESSION: No acute abnormality or explanation for abdominal pain. Electronically Signed   By: Keith Rake M.D.   On: 04/30/2019 21:59   Dg Chest Port 1 View  Result Date: 05/14/2019 CLINICAL DATA:  Shortness of breath, cough and chest pain since Friday. EXAM: PORTABLE CHEST 1 VIEW COMPARISON:  Chest x-rays dated 06/22/2017 and 10/14/2016. FINDINGS: Heart size and mediastinal contours are within normal limits. Lungs are clear. No pleural effusions seen. Osseous structures are unremarkable. IMPRESSION: No active disease. No evidence of pneumonia or pulmonary edema. Electronically Signed   By: Franki Cabot M.D.   On: 05/14/2019 06:38      Assessment and Plan:    A: Dysmenorrhea    chronic pain complaints,   developmental academic disorder(verbal apraxia) P: Rx Elagolix Continous oral contraceptive use lo loestrin 1/20 continuous tx.  I discussed the assessment and treatment plan with the patient. The patient was provided an opportunity to ask questions and all were answered. The patient agreed with the plan and demonstrated an understanding of the instructions.   The patient was advised to call back or seek an in-person evaluation/go to the ED if the symptoms worsen or if the condition fails to improve as anticipated.  I provided 8 minutes of non-face-to-face time during this encounter.  By signing my name below, I, Samul Dada, attest that this documentation has been prepared under the direction and in the presence of Jonnie Kind, MD. Electronically Signed: [place]. 05/17/19. 9:48 AM.  I personally performed the services described in  this documentation, which was SCRIBED in my presence. The recorded information has been reviewed and considered accurate. It has been edited as necessary during review. Jonnie Kind, MD

## 2019-05-17 NOTE — Telephone Encounter (Signed)
Hannah Shaw had a televisit this a.m.,  Also, I spoke to Hannah Shaw primary care in [place]. System, and  I have placed Hannah Shaw on Continuous Low dose OCP's to eliminate menses. Hannah Shaw is to continue Hannah Shaw [facility] to suppress any endometriosis.

## 2019-05-18 ENCOUNTER — Telehealth: Payer: Self-pay | Admitting: Emergency Medicine

## 2019-05-18 NOTE — Telephone Encounter (Addendum)
Called patient to give result of covid test --negative. No answer and no voicemail--she called me back and I gave her restult

## 2019-06-13 ENCOUNTER — Telehealth: Payer: Self-pay | Admitting: Obstetrics and Gynecology

## 2019-06-13 DIAGNOSIS — G8929 Other chronic pain: Secondary | ICD-10-CM | POA: Insufficient documentation

## 2019-06-13 DIAGNOSIS — M5442 Lumbago with sciatica, left side: Secondary | ICD-10-CM | POA: Insufficient documentation

## 2019-06-13 NOTE — Telephone Encounter (Signed)
Spoke w/the patient, reminded her that she has a tele visit and DO NOT come to the office.

## 2019-06-14 ENCOUNTER — Encounter: Payer: Self-pay | Admitting: Obstetrics and Gynecology

## 2019-06-14 ENCOUNTER — Other Ambulatory Visit: Payer: Self-pay

## 2019-06-14 ENCOUNTER — Ambulatory Visit (INDEPENDENT_AMBULATORY_CARE_PROVIDER_SITE_OTHER): Payer: Medicare Other | Admitting: Obstetrics and Gynecology

## 2019-06-14 DIAGNOSIS — R102 Pelvic and perineal pain: Secondary | ICD-10-CM

## 2019-06-14 DIAGNOSIS — N946 Dysmenorrhea, unspecified: Secondary | ICD-10-CM | POA: Diagnosis not present

## 2019-06-14 DIAGNOSIS — G8929 Other chronic pain: Secondary | ICD-10-CM

## 2019-06-14 DIAGNOSIS — F819 Developmental disorder of scholastic skills, unspecified: Secondary | ICD-10-CM

## 2019-06-14 DIAGNOSIS — R482 Apraxia: Secondary | ICD-10-CM | POA: Diagnosis not present
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
## 2019-06-16 ENCOUNTER — Telehealth: Payer: Self-pay | Admitting: *Deleted

## 2019-06-16 ENCOUNTER — Telehealth: Payer: Self-pay | Admitting: Obstetrics and Gynecology

## 2019-06-16 NOTE — Telephone Encounter (Signed)
Pt requesting a refill of oxyCODONE-acetaminophen.

## 2019-06-16 NOTE — Telephone Encounter (Signed)
Returned patient's call and informed her I spoke with Dr Glo Herring who stated he was not going to refill her pain medication as he told her this at her last appointment.  Pt then hung up.

## 2019-06-17 ENCOUNTER — Emergency Department
Admission: EM | Admit: 2019-06-17 | Discharge: 2019-06-17 | Disposition: A | Discharge status: Home / Self Care | Payer: Medicare Other | Attending: Emergency Medicine | Admitting: Emergency Medicine

## 2019-06-17 ENCOUNTER — Emergency Department: Payer: Medicare Other

## 2019-06-17 DIAGNOSIS — R102 Pelvic and perineal pain: Secondary | ICD-10-CM | POA: Diagnosis not present

## 2019-06-17 DIAGNOSIS — N83202 Unspecified ovarian cyst, left side: Secondary | ICD-10-CM | POA: Diagnosis not present

## 2019-06-17 DIAGNOSIS — E039 Hypothyroidism, unspecified: Secondary | ICD-10-CM | POA: Diagnosis not present

## 2019-06-17 DIAGNOSIS — R112 Nausea with vomiting, unspecified: Secondary | ICD-10-CM | POA: Diagnosis not present

## 2019-06-17 DIAGNOSIS — Z79899 Other long term (current) drug therapy: Secondary | ICD-10-CM | POA: Diagnosis not present

## 2019-06-17 DIAGNOSIS — F17210 Nicotine dependence, cigarettes, uncomplicated: Secondary | ICD-10-CM | POA: Insufficient documentation

## 2019-06-17 DIAGNOSIS — J45909 Unspecified asthma, uncomplicated: Secondary | ICD-10-CM | POA: Insufficient documentation

## 2019-06-17 DIAGNOSIS — R1030 Lower abdominal pain, unspecified: Secondary | ICD-10-CM | POA: Diagnosis present

## 2019-06-17 DIAGNOSIS — N72 Inflammatory disease of cervix uteri: Secondary | ICD-10-CM | POA: Diagnosis not present

## 2019-06-17 LAB — LIPASE, BLOOD: Lipase: 25 U/L (ref 11–51)

## 2019-06-17 LAB — WET PREP, GENITAL
Clue Cells Wet Prep HPF POC: NONE SEEN
Sperm: NONE SEEN
Trich, Wet Prep: NONE SEEN
Yeast Wet Prep HPF POC: NONE SEEN

## 2019-06-17 LAB — CBC
HCT: 40.7 % (ref 36.0–46.0)
Hemoglobin: 14.4 g/dL (ref 12.0–15.0)
MCH: 33 pg (ref 26.0–34.0)
MCHC: 35.4 g/dL (ref 30.0–36.0)
MCV: 93.3 fL (ref 80.0–100.0)
Platelets: 245 10*3/uL (ref 150–400)
RBC: 4.36 MIL/uL (ref 3.87–5.11)
RDW: 11.2 % — ABNORMAL LOW (ref 11.5–15.5)
WBC: 6.7 10*3/uL (ref 4.0–10.5)
nRBC: 0 % (ref 0.0–0.2)

## 2019-06-17 LAB — URINALYSIS, COMPLETE (UACMP) WITH MICROSCOPIC
Bacteria, UA: NONE SEEN
Bilirubin Urine: NEGATIVE
Glucose, UA: NEGATIVE mg/dL
Ketones, ur: NEGATIVE mg/dL
Nitrite: NEGATIVE
Protein, ur: NEGATIVE mg/dL
Specific Gravity, Urine: 1.014 (ref 1.005–1.030)
pH: 5 (ref 5.0–8.0)

## 2019-06-17 LAB — COMPREHENSIVE METABOLIC PANEL
ALT: 18 U/L (ref 0–44)
AST: 18 U/L (ref 15–41)
Albumin: 4.8 g/dL (ref 3.5–5.0)
Alkaline Phosphatase: 38 U/L (ref 38–126)
Anion gap: 10 (ref 5–15)
BUN: 7 mg/dL (ref 6–20)
CO2: 22 mmol/L (ref 22–32)
Calcium: 9.1 mg/dL (ref 8.9–10.3)
Chloride: 105 mmol/L (ref 98–111)
Creatinine, Ser: 0.71 mg/dL (ref 0.44–1.00)
GFR calc Af Amer: >60 mL/min (ref >60)
GFR calc non Af Amer: >60 mL/min (ref >60)
Glucose, Bld: 94 mg/dL (ref 70–99)
Potassium: 3.6 mmol/L (ref 3.5–5.1)
Sodium: 137 mmol/L (ref 135–145)
Total Bilirubin: 1 mg/dL (ref 0.3–1.2)
Total Protein: 7.8 g/dL (ref 6.5–8.1)

## 2019-06-17 LAB — PREGNANCY, URINE: Preg Test, Ur: NEGATIVE

## 2019-06-17 MED ORDER — OXYCODONE-ACETAMINOPHEN 7.5-325 MG PO TABS: 1.0000 | ORAL_TABLET | Freq: Four times a day (QID) | ORAL | 0 refills | Status: AC | PRN

## 2019-06-17 MED ORDER — AZITHROMYCIN 500 MG PO TABS
1000.0000 mg | ORAL_TABLET | Freq: Once | ORAL | Status: AC
  Administered 2019-06-17: 1000 mg via ORAL
  Filled 2019-06-17: qty 2

## 2019-06-17 MED ORDER — OXYCODONE-ACETAMINOPHEN 5-325 MG PO TABS
1.0000 | ORAL_TABLET | Freq: Once | ORAL | Status: AC
  Administered 2019-06-17: 1 via ORAL
  Filled 2019-06-17: qty 1

## 2019-06-17 MED ORDER — CEFTRIAXONE SODIUM 250 MG IJ SOLR
250.0000 mg | Freq: Once | INTRAMUSCULAR | Status: AC
  Administered 2019-06-17: 250 mg via INTRAMUSCULAR
  Filled 2019-06-17: qty 250

## 2019-06-17 NOTE — ED Notes (Signed)
Dr. Siadecki at bedside 

## 2019-06-17 NOTE — Discharge Instructions (Signed)
Follow-up with your OB/GYN within the next 1 to 2 weeks.  Return to the ER immediately for new, worsening, or persistent severe abdominal or pelvic pain, fever, vomiting or any other new or worsening symptoms that concern you.

## 2019-06-17 NOTE — ED Notes (Signed)
Pt states she has lower mid abdominal pain that started yesterday- states that she has been throwing up from the pain- pt states she still has gallbladder but not the appendix

## 2019-06-17 NOTE — ED Notes (Addendum)
Call bell light answered, Delay explained to pt, pt given fluids as requested

## 2019-06-17 NOTE — ED Triage Notes (Signed)
Pt presents via POV c/o lower abd pain. Reports hx endometriosis. Pain started last PM.

## 2019-06-17 NOTE — ED Notes (Signed)
FIRST NURSE NOTE:  Pt arrived via EMS from [facility] with reports of abdominal pain, pt hs hx of endometriosis and chronic back pain. Pt has temp of 101.9

## 2019-06-17 NOTE — ED Notes (Addendum)
Pt handed her cellphone to this RN to speak with her mom- per pt's mom, pt has a hx of endometriosis and has been out of her pain medication for 4 days

## 2019-06-17 NOTE — ED Notes (Signed)
Blood drawn and ekg perfomed

## 2019-06-17 NOTE — ED Notes (Addendum)
Call bell light answered, Pt reports pharmacy in [place] is closed tomorrow please send to [facility] [place] on [address]

## 2019-06-17 NOTE — ED Notes (Signed)
Pt up to use bathroom 

## 2019-06-17 NOTE — ED Provider Notes (Signed)
[facility] Emergency Department Provider Note ____________________________________________   First MD Initiated Contact with Patient 06/17/19 1627     (approximate)  I have reviewed the triage vital signs and the nursing notes.   HISTORY  Chief Complaint Abdominal Pain    HPI Hannah Shaw is a 23 y.o. female with PMH as noted below including history of endometriosis and chronic pain who presents with lower abdominal pain, midline suprapubic area, acute onset yesterday and persistent since then, and associated with nausea and a few episodes of vomiting earlier.  The patient denies any vaginal bleeding or discharge, urinary symptoms, or diarrhea.  She reports that she ran out of her oxycodone that she takes chronically for her pain about 4 days ago.  Past Medical History:  Diagnosis Date  . Asthma    WELL CONTROLLED  . Chronic kidney disease    H/O STONES  . Endometriosis   . GERD (gastroesophageal reflux disease)   . Headache   . Hypothyroidism   . Thyroid disease     Patient Active Problem List   Diagnosis Date Noted  . 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 APPENDECTOMY    . ENDOMETRIAL BIOPSY    . LAPAROSCOPY N/A 03/13/2016   Procedure: LAPAROSCOPY DIAGNOSTIC;  Surgeon: Honor Loh Ward, MD;  Location: [facility] ORS;  Service: Gynecology;  Laterality: N/A;    Prior to Admission medications   Medication Sig Start Date End Date Taking? Authorizing Provider  albuterol (VENTOLIN HFA) 108 (90 Base) MCG/ACT inhaler Inhale 2 puffs into the lungs every 4 (four) hours as needed for wheezing or shortness of breath. 05/14/19   Paulette Blanch, MD  clonazePAM (KLONOPIN) 1 MG tablet Take 45 mg by mouth 2 (two) times daily as needed.     [provider]  desogestrel-ethinyl estradiol (MIRCETTE) 0.15-0.02/0.01 MG (21/5) tablet Take 1 tablet by mouth at bedtime.  12/20/18   Jonnie Kind, MD  levothyroxine (SYNTHROID, LEVOTHROID) 25 MCG tablet Take 25 mcg by mouth daily before breakfast.     [provider]  Norethindrone-Ethinyl Estradiol-Fe Biphas (LO LOESTRIN FE) 1 MG-10 MCG / 10 MCG tablet Take 1 tablet by mouth daily. 05/17/19   Jonnie Kind, MD  oxyCODONE-acetaminophen (PERCOCET) 7.5-325 MG tablet Take 1 tablet by mouth every 6 (six) hours as needed for severe pain. 01/26/19   Jonnie Kind, MD  oxyCODONE-acetaminophen (PERCOCET) 7.5-325 MG tablet Take 1 tablet by mouth every 6 (six) hours as needed for up to 3 days for severe pain. 06/17/19 06/20/19  Arta Silence, MD    Allergies Morphine and related, Fentanyl, Betadine [povidone iodine], and Iodine  Family History  Problem Relation Age of Onset  . Cervical cancer Mother   . Lung cancer Paternal Uncle     Social History Social History   Tobacco Use  . Smoking status: Current Some Day Smoker    Packs/day: 0.25    Types: Cigarettes  . Smokeless tobacco: Never Used  Substance Use Topics  . Alcohol use: No  . Drug use: No    Review of Systems  Constitutional: Positive for low-grade fever. Eyes: No redness. ENT: No sore throat. Cardiovascular: Denies chest pain. Respiratory: Denies shortness of breath. Gastrointestinal: Positive for nausea and vomiting.  No diarrhea. Genitourinary: Negative for dysuria, vaginal bleeding, or discharge.  Musculoskeletal: Negative for acute back pain. Skin: Negative for rash. Neurological: Negative for headache.  ____________________________________________   PHYSICAL EXAM:  VITAL SIGNS: ED Triage Vitals  Enc Vitals Group     BP 06/17/19 1345 120/67     Pulse Rate 06/17/19 1345 (!) 110     Resp 06/17/19 1345 18     Temp 06/17/19 1345 (!) 101.8 F (38.8 C)     Temp Source 06/17/19 1345 Oral     SpO2 06/17/19 1345 99 %     Weight 06/17/19 1345 100 lb (45.4 kg)     Height 06/17/19 1345 5\' 1"  (1.549 m)     Head  Circumference --      Peak Flow --      Pain Score 06/17/19 1401 10     Pain Loc --      Pain Edu? --      Excl. in [place]? --     Constitutional: Alert and oriented. Well appearing and in no acute distress. Eyes: Conjunctivae are normal.  Head: Atraumatic. Nose: No congestion/rhinnorhea. Mouth/Throat: Mucous membranes are moist.   Neck: Normal range of motion.  Cardiovascular: Good peripheral circulation. Respiratory: Normal respiratory effort.  No retractions.  Gastrointestinal: Soft with mild midline suprapubic tenderness.  No distention.  Genitourinary: Normal external genitalia.  Cervix appears erythematous and inflamed with no significant tenderness.  No significant adnexal tenderness.  Trace amount of whitish discharge. Musculoskeletal: Extremities warm and well perfused.  Neurologic:  Normal speech and language. No gross focal neurologic deficits are appreciated.  Skin:  Skin is warm and dry. No rash noted. Psychiatric: Mood and affect are normal. Speech and behavior are normal.  ____________________________________________   LABS (all labs ordered are listed, but only abnormal results are displayed)  Labs Reviewed  WET PREP, GENITAL - Abnormal; Notable for the following components:      Result Value   WBC, Wet Prep HPF POC FEW (*)    All other components within normal limits  CBC - Abnormal; Notable for the following components:   RDW 11.2 (*)    All other components within normal limits  URINALYSIS, COMPLETE (UACMP) WITH MICROSCOPIC - Abnormal; Notable for the following components:   Color, Urine YELLOW (*)    APPearance HAZY (*)    Hgb urine dipstick MODERATE (*)    Leukocytes,Ua SMALL (*)    All other components within normal limits  GC/CHLAMYDIA PROBE AMP  LIPASE, BLOOD  COMPREHENSIVE METABOLIC PANEL  PREGNANCY, URINE   ____________________________________________  EKG  ED ECG REPORT I, Arta Silence, the attending physician, personally viewed and  interpreted this ECG.  Date: 06/18/2019 EKG Time: 1345 Rate: 118 Rhythm: Sinus tachycardia QRS Axis: normal Intervals: normal ST/T Wave abnormalities: normal Narrative Interpretation: no evidence of acute ischemia; no significant change when compared to EKG of 06/24/2014  ____________________________________________  RADIOLOGY  US pelvis transvaginal: Left 2.4 cm probable hemorrhagic ovarian cyst; no acute abnormality  ____________________________________________   PROCEDURES  Procedure(s) performed: No  Procedures  Critical Care performed: No ____________________________________________   INITIAL IMPRESSION / ASSESSMENT AND PLAN / ED COURSE  Pertinent labs & imaging results that were available during my care of the patient were reviewed by me and considered in my medical decision making (see chart for details).  23 year old female with PMH as noted above including a history of endometriosis presents with acute on chronic suprapubic area pain with some nausea and vomiting.  She also reports running out of her oxycodone several days ago.  On exam the patient is overall well-appearing.  Her vital signs are normal except for fever  of 101.  She has mild suprapubic tenderness.  There is no tenderness about the rest of the abdomen.  Pelvic exam reveals slightly inflamed appearing cervix but no significant tenderness or discharge.  Differential includes endometriosis although this would not explain the fever, cervicitis, UTI/cystitis, ovarian cyst rupture, or possibly exacerbation of the patient's chronic pain given that she has been without her chronic pain medication for several days.  There is no clinical evidence for torsion,  ----------------------------------------- 11:57 PM on 06/17/2019 -----------------------------------------  The patient's wet prep and urinalysis are unremarkable.  Her CBC and metabolic panel are also within normal limits.  Pelvic ultrasound shows a  small left-sided likely hemorrhagic ovarian cyst but no other acute abnormalities.  Overall I suspect the acute pain is likely combination of the ovarian cyst and the patient being off of her chronic pain medications.  It is also possible that she has cervicitis given the findings on my pelvic exam.  There is no clinical evidence of PID and no ultrasound evidence of TOA.  There is no clinical suspicion for torsion.  The patient appears very comfortable and her vital signs are normal.  I gave her ceftriaxone and azithromycin to treat for possible cervicitis.  I reviewed her record in the PMP registry and confirmed that the patient receives Percocet 7.5 mg and only has a history of receiving prescriptions from 2 providers.  Therefore I will prescribe a 3-day supply of oxycodone to cover her until she is able to follow-up.  I discussed the results of the work-up extensively with the patient and with her mother over the phone.  I gave them both thorough return precautions and they expressed understanding.   ____________________________________________   FINAL CLINICAL IMPRESSION(S) / ED DIAGNOSES  Final diagnoses:  Pelvic pain  Cervicitis  Cyst of left ovary      NEW MEDICATIONS STARTED DURING THIS VISIT:  Discharge Medication List as of 06/17/2019 11:34 PM    START taking these medications   Details  !! oxyCODONE-acetaminophen (PERCOCET) 7.5-325 MG tablet Take 1 tablet by mouth every 6 (six) hours as needed for up to 3 days for severe pain., Starting Sat 06/17/2019, Until Tue 06/20/2019, Normal     !! - Potential duplicate medications found. Please discuss with provider.       Note:  This document was prepared using Dragon voice recognition software and may include unintentional dictation errors.    Arta Silence, MD 06/18/19 [phone]

## 2019-06-19 ENCOUNTER — Telehealth: Payer: Self-pay | Admitting: Obstetrics and Gynecology

## 2019-06-19 NOTE — Telephone Encounter (Signed)
Pt states that she was seen in the ER for her cyst and the cyst is bleeding. Please advise.

## 2019-06-20 ENCOUNTER — Telehealth: Payer: Self-pay | Admitting: Obstetrics and Gynecology

## 2019-06-20 ENCOUNTER — Telehealth: Payer: Self-pay | Admitting: *Deleted

## 2019-06-20 NOTE — Telephone Encounter (Signed)
Patient called stated that she went to [facility] on Sat and they told her she has a cyst on her ovary and that it's bleeding which is going into her stomach.  She wants to speak to Dr. Glo Herring.  [phone]

## 2019-06-20 NOTE — Telephone Encounter (Signed)
Returned patient's call to discuss pain and bleeding.  States doctor at [facility] told her they cyst was bleeding into her abdomen and needed treatment.  She was prescribed some pain medication and antibiotics in which she says she is taking.  Informed patient I had Dr Glo Herring look over her visit to the ER and also review the [place] Patient was asked if she was still continuing to take BCP and [place] and patient stated "yes, but it's not helping". She then gave the phone to her mother who says she is scheduled to see a doctor at the pain clinic on 9/3. Asked mother how we could help her daughter who then stated "the pain clinic was going to take better care of her". Mother then stated she did not want to discuss anything further as she has a Chief Executive Officer who is looking over this.

## 2019-06-21 ENCOUNTER — Ambulatory Visit: Payer: Medicare Other | Admitting: Obstetrics and Gynecology

## 2019-06-21 ENCOUNTER — Emergency Department
Admission: EM | Admit: 2019-06-21 | Discharge: 2019-06-21 | Disposition: A | Discharge status: Home / Self Care | Payer: Medicare Other | Attending: Emergency Medicine | Admitting: Emergency Medicine

## 2019-06-21 ENCOUNTER — Other Ambulatory Visit: Payer: Self-pay

## 2019-06-21 DIAGNOSIS — N939 Abnormal uterine and vaginal bleeding, unspecified: Secondary | ICD-10-CM | POA: Diagnosis not present

## 2019-06-21 DIAGNOSIS — Z91041 Radiographic dye allergy status: Secondary | ICD-10-CM | POA: Insufficient documentation

## 2019-06-21 DIAGNOSIS — J45909 Unspecified asthma, uncomplicated: Secondary | ICD-10-CM | POA: Insufficient documentation

## 2019-06-21 DIAGNOSIS — Z79899 Other long term (current) drug therapy: Secondary | ICD-10-CM | POA: Insufficient documentation

## 2019-06-21 DIAGNOSIS — R509 Fever, unspecified: Secondary | ICD-10-CM | POA: Diagnosis not present

## 2019-06-21 DIAGNOSIS — Z885 Allergy status to narcotic agent status: Secondary | ICD-10-CM | POA: Diagnosis not present

## 2019-06-21 DIAGNOSIS — R1030 Lower abdominal pain, unspecified: Secondary | ICD-10-CM | POA: Insufficient documentation

## 2019-06-21 DIAGNOSIS — F17210 Nicotine dependence, cigarettes, uncomplicated: Secondary | ICD-10-CM | POA: Insufficient documentation

## 2019-06-21 LAB — CBC
HCT: 39.3 % (ref 36.0–46.0)
Hemoglobin: 13.9 g/dL (ref 12.0–15.0)
MCH: 32.9 pg (ref 26.0–34.0)
MCHC: 35.4 g/dL (ref 30.0–36.0)
MCV: 92.9 fL (ref 80.0–100.0)
Platelets: 232 10*3/uL (ref 150–400)
RBC: 4.23 MIL/uL (ref 3.87–5.11)
RDW: 11.2 % — ABNORMAL LOW (ref 11.5–15.5)
WBC: 5.4 10*3/uL (ref 4.0–10.5)
nRBC: 0 % (ref 0.0–0.2)

## 2019-06-21 LAB — BASIC METABOLIC PANEL
Anion gap: 7 (ref 5–15)
BUN: 9 mg/dL (ref 6–20)
CO2: 24 mmol/L (ref 22–32)
Calcium: 8.9 mg/dL (ref 8.9–10.3)
Chloride: 106 mmol/L (ref 98–111)
Creatinine, Ser: 0.69 mg/dL (ref 0.44–1.00)
GFR calc Af Amer: >60 mL/min (ref >60)
GFR calc non Af Amer: >60 mL/min (ref >60)
Glucose, Bld: 107 mg/dL — ABNORMAL HIGH (ref 70–99)
Potassium: 3.7 mmol/L (ref 3.5–5.1)
Sodium: 137 mmol/L (ref 135–145)

## 2019-06-21 LAB — POCT PREGNANCY, URINE: Preg Test, Ur: NEGATIVE

## 2019-06-21 MED ORDER — MELOXICAM 7.5 MG PO TABS
15.0000 mg | ORAL_TABLET | Freq: Once | ORAL | Status: AC
  Administered 2019-06-21: 15 mg via ORAL
  Filled 2019-06-21 (×2): qty 2

## 2019-06-21 MED ORDER — MELOXICAM 15 MG PO TABS: 15.0000 mg | ORAL_TABLET | Freq: Every day | ORAL | 0 refills | Status: AC

## 2019-06-21 NOTE — ED Provider Notes (Signed)
[facility] Emergency Department Provider Note  Time seen: 3:11 PM  I have reviewed the triage vital signs and the nursing notes.   HISTORY  Chief Complaint Abdominal Pain   HPI Hannah Shaw is a 23 y.o. female with a past medical history of asthma, endometriosis, headache, chronic abdominal pain, presents to the emergency department for lower abdominal pain and vaginal bleeding.  According to the patient she was seen here 3 days ago for the same, was diagnosed with a likely hemorrhagic cyst in left ovary was discharged with pain medication.  Patient states the pain continues and she is continuing to having vaginal bleeding so she came to the emergency department for evaluation.  Denies any discharge.  No dysuria.  No upper abdominal pain.  No cough or congestion.  States low-grade fever which is chronic for her per patient.  Refusing COVID swab today.   Past Medical History:  Diagnosis Date  . Asthma    WELL CONTROLLED  . Chronic kidney disease    H/O STONES  . Endometriosis   . GERD (gastroesophageal reflux disease)   . Headache   . Hypothyroidism   . Thyroid disease     Patient Active Problem List   Diagnosis Date Noted  . 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 APPENDECTOMY    . ENDOMETRIAL BIOPSY    . LAPAROSCOPY N/A 03/13/2016   Procedure: LAPAROSCOPY DIAGNOSTIC;  Surgeon: Honor Loh Ward, MD;  Location: [facility] ORS;  Service: Gynecology;  Laterality: N/A;    Prior to Admission medications   Medication Sig Start Date End Date Taking? Authorizing Provider  albuterol (VENTOLIN HFA) 108 (90 Base) MCG/ACT inhaler Inhale 2 puffs into the lungs every 4 (four) hours as needed for wheezing or shortness of breath. 05/14/19   Paulette Blanch, MD  clonazePAM (KLONOPIN) 1 MG tablet Take 45 mg by mouth 2 (two) times daily as needed.     [provider]   desogestrel-ethinyl estradiol (MIRCETTE) 0.15-0.02/0.01 MG (21/5) tablet Take 1 tablet by mouth at bedtime. 12/20/18   Jonnie Kind, MD  levothyroxine (SYNTHROID, LEVOTHROID) 25 MCG tablet Take 25 mcg by mouth daily before breakfast.     [provider]  Norethindrone-Ethinyl Estradiol-Fe Biphas (LO LOESTRIN FE) 1 MG-10 MCG / 10 MCG tablet Take 1 tablet by mouth daily. 05/17/19   Jonnie Kind, MD  oxyCODONE-acetaminophen (PERCOCET) 7.5-325 MG tablet Take 1 tablet by mouth every 6 (six) hours as needed for severe pain. 01/26/19   Jonnie Kind, MD    Allergies  Allergen Reactions  . Morphine And Related Hives and Shortness Of Breath  . Fentanyl     PT DENIES THIS ALLERGY DURING PHONE INTERVIEW ON 03-05-16  . Betadine [Povidone Iodine] Rash  . Iodine Rash    Family History  Problem Relation Age of Onset  . Cervical cancer Mother   . Lung cancer Paternal Uncle     Social History Social History   Tobacco Use  . Smoking status: Current Some Day Smoker    Packs/day: 0.25    Types: Cigarettes  . Smokeless tobacco: Never Used  Substance Use Topics  . Alcohol use: No  . Drug use: No    Review of Systems Constitutional: Low-grade fever at home which the patient states is chronic for her. Cardiovascular: Negative for chest pain. Respiratory: Negative for shortness of breath. Gastrointestinal: Lower abdominal pain. Genitourinary:  No dysuria.  Positive for vaginal bleeding. Musculoskeletal: Negative for musculoskeletal complaints Skin: Negative for skin complaints  Neurological: Negative for headache All other ROS negative  ____________________________________________   PHYSICAL EXAM:  VITAL SIGNS: ED Triage Vitals  Enc Vitals Group     BP 06/21/19 1219 104/69     Pulse Rate 06/21/19 1219 (!) 55     Resp 06/21/19 1219 19     Temp 06/21/19 1219 99.9 F (37.7 C)     Temp src --      SpO2 06/21/19 1219 99 %     Weight 06/21/19 1217 90 lb (40.8 kg)      Height 06/21/19 1217 5\' 2"  (1.575 m)     Head Circumference --      Peak Flow --      Pain Score 06/21/19 1217 10     Pain Loc --      Pain Edu? --      Excl. in [place]? --    Constitutional: Alert and oriented. Well appearing and in no distress. Eyes: Normal exam ENT      Head: Normocephalic and atraumatic.      Mouth/Throat: Mucous membranes are moist. Cardiovascular: Normal rate, regular rhythm.  Respiratory: Normal respiratory effort without tachypnea nor retractions. Breath sounds are clear  Gastrointestinal: Soft, mild suprapubic tenderness palpation no rebound guarding or distention. Musculoskeletal: Nontender with normal range of motion in all extremities.  Neurologic:  Normal speech and language. No gross focal neurologic deficits  Skin:  Skin is warm, dry and intact.  Psychiatric: Mood and affect are normal.   ____________________________________________    INITIAL IMPRESSION / ASSESSMENT AND PLAN / ED COURSE  Pertinent labs & imaging results that were available during my care of the patient were reviewed by me and considered in my medical decision making (see chart for details).   Patient presents to the emergency department for lower abdominal pain and vaginal bleeding.  Patient was seen for the same 3 days ago had a pelvic exam performed showing normal work-up, lab work was normal, ultrasound showed 2.4 cm left likely hemorrhagic cyst.  Hemorrhagic cyst could possibly be the cause of the patient's pain.  Patient was discharged with Percocet.  Patient was seeing pain management, was previously on chronic pain medication up until this month.  Most recent note from pain management stated that the patient was unwilling to proceed with the management plan attempting to limit opioids.  Patient states she has an appointment with a new pain management physician coming up.  Per pain management notes lower abdominal pain is been an ongoing issue x6 years.  Patient's work-up today is  reassuring including a normal white blood cell count, stable H&H, normal chemistry.  We will attempt pain management with meloxicam for the patient she will follow-up with her doctor as well as pain management as scheduled which she states is next week.  Patient does have a low-grade fever 99.9, denies any shortness of breath or cough although did cough twice during my exam.  I offered/recommended to test her for COVID given her normal white blood cell count and low-grade temperature with slight cough during my evaluation.  Patient states she was tested a month ago and does not want a test again because of the discomfort associated with the test.  Hannah Shaw was evaluated in Emergency Department on 06/21/2019 for the symptoms described in the history of present illness. She was evaluated in the context of the global COVID-19 pandemic, which  necessitated consideration that the patient might be at risk for infection with the SARS-CoV-2 virus that causes COVID-19. Institutional protocols and algorithms that pertain to the evaluation of patients at risk for COVID-19 are in a state of rapid change based on information released by regulatory bodies including the CDC and federal and state organizations. These policies and algorithms were followed during the patient's care in the ED.  ____________________________________________   FINAL CLINICAL IMPRESSION(S) / ED DIAGNOSES  Lower abdominal pain   Harvest Dark, MD 06/21/19 1514

## 2019-06-21 NOTE — ED Notes (Signed)

## 2019-06-21 NOTE — ED Triage Notes (Signed)
Pt comes via POV from home with c/o abdominal pain. Pt states she was seen here on Saturday for cysts. Pt states she was told to come back if it got worse.  Pt states pain and that now she is bleeding. Pt states she has been going through several pads a day. Pt states bright red blood.  Pt states some vomiting.

## 2019-06-22 ENCOUNTER — Ambulatory Visit: Payer: Medicare Other | Admitting: Obstetrics and Gynecology

## 2019-06-26 ENCOUNTER — Other Ambulatory Visit: Payer: Self-pay

## 2019-06-26 ENCOUNTER — Encounter: Payer: Self-pay | Admitting: Obstetrics and Gynecology

## 2019-06-26 ENCOUNTER — Ambulatory Visit (INDEPENDENT_AMBULATORY_CARE_PROVIDER_SITE_OTHER): Payer: Medicare Other | Admitting: Obstetrics and Gynecology

## 2019-06-26 VITALS — BP 92/66 | HR 66 | Ht 61.0 in | Wt 97.2 lb

## 2019-06-26 DIAGNOSIS — R102 Pelvic and perineal pain: Secondary | ICD-10-CM | POA: Diagnosis not present

## 2019-06-26 DIAGNOSIS — N83202 Unspecified ovarian cyst, left side: Secondary | ICD-10-CM

## 2019-06-26 DIAGNOSIS — G8929 Other chronic pain: Secondary | ICD-10-CM

## 2019-06-26 NOTE — Progress Notes (Signed)
Obstetrics & Gynecology Office Visit   Chief Complaint  Patient presents with   ER follow up    Left ovarian cyst    History of Present Illness: 23 y.o. G81P0010 female who presents in follow up from the ER for abdominal pain. She was noted to have a left ovarian cyst, that appeared hemorrhagic, it measured 2.4 cm.  She was told to go to an OB/GYN for her pain.  She states that she was told the cyst would need to be removed or she would bleed to death, per the patient report.  She continues to have pain.  The pain is located in her suprapubic region, radiating to the left side.  She describes the pain as "like a knife."  She rates the a 10/10 right now. Alleviating factors: percocet 7.5 mg helps some.  She hasn't been taking them for a while since she stopped going to her prior Pain Clinic provider.  She has a new pain clinic doctor in [place].  Her next appt with her pain doctor is on Thursday.  She has an appointment with Dr. Vikki Ports Ward at [facility] at the end of September. She has been in touch with [facility] over the past week.  She states that they want her to come in for a check.  The patient states that Dr. Leonides Schanz is aware of her cyst.  Aggravating factors: moving certain directions. Associated symptoms: none.  Of note she was thought to have an infection on her cervix in the ER. She was given "a shot" (likely ceftriaxone) and azithromycin 1,000 mg.  Her GC/CT cervical swab was negative, as was her wet prep.    The pain got worse recently.  She believes that having to stop taking percocet has made the pain worse.    Of note: she has seen multiple OB/GYNs in the past year (Dr. Glo Herring in [place], Dr. Amalia Hailey at [facility], now she is talking to [place], as well as her visit today). She had an appointment recently at a [facility] in [place] where they did not prescribe her narcotic pain medication.  So, she no longer wants to go there. She refused medication that  was offered.   She was taking Freida Busman, which she states wasn't helping, and she has stopped taking this. She has stopped taking combined OCPs.  She denies hematuria, hematochezia, constipation, hemorrhoids. She does have a history of kidney stones. She states that is not happening now.  Her last pap smear was 09/23/2016 - normal.    Past Medical History:  Diagnosis Date   Asthma    WELL CONTROLLED   Chronic kidney disease    H/O STONES   Endometriosis    GERD (gastroesophageal reflux disease)    Headache    Hypothyroidism    Thyroid disease    Past Surgical History:  Procedure Laterality Date   APPENDECTOMY     ENDOMETRIAL BIOPSY     LAPAROSCOPY N/A 03/13/2016   Procedure: LAPAROSCOPY DIAGNOSTIC;  Surgeon: Honor Loh Ward, MD;  Location: [facility] ORS;  Service: Gynecology;  Laterality: N/A;    Gynecologic History: Patient's last menstrual period was 06/21/2019.  Obstetric History: G1P0010  Family History  Problem Relation Age of Onset   Cervical cancer Mother    Lung cancer Paternal Uncle     Social History   Socioeconomic History   Marital status: Single    Spouse name: Not on file   Number of children: 0   Years of education: Not  on file   Highest education level: Not on file  Occupational History   Not on file  Social Needs   Financial resource strain: Not on file   Food insecurity    Worry: Not on file    Inability: Not on file   Transportation needs    Medical: Not on file    Non-medical: Not on file  Tobacco Use   Smoking status: Current Some Day Smoker    Packs/day: 0.25    Types: Cigarettes   Smokeless tobacco: Never Used  Substance and Sexual Activity   Alcohol use: No   Drug use: Never   Sexual activity: Yes    Birth control/protection: Pill  Lifestyle   Physical activity    Days per week: Not on file    Minutes per session: Not on file   Stress: Not on file  Relationships   Social connections    Talks on phone:  Not on file    Gets together: Not on file    Attends religious service: Not on file    Active member of club or organization: Not on file    Attends meetings of clubs or organizations: Not on file    Relationship status: Not on file   Intimate partner violence    Fear of current or ex partner: Not on file    Emotionally abused: Not on file    Physically abused: Not on file    Forced sexual activity: Not on file  Other Topics Concern   Not on file  Social History Narrative   Not on file    Allergies  Allergen Reactions   Morphine And Related Hives and Shortness Of Breath   Fentanyl     PT DENIES THIS ALLERGY DURING PHONE INTERVIEW ON 03-05-16   Betadine [Povidone Iodine] Rash   Iodine Rash    Prior to Admission medications   Medication Sig Start Date End Date Taking? Authorizing Provider  clonazePAM (KLONOPIN) 0.5 MG tablet Take 0.5 mg by mouth 2 (two) times daily as needed for anxiety.   Yes [provider]  levothyroxine (SYNTHROID, LEVOTHROID) 25 MCG tablet Take 25 mcg by mouth daily before breakfast.    Yes [provider]  meloxicam (MOBIC) 15 MG tablet Take 1 tablet (15 mg total) by mouth daily. 06/21/19 07/21/19 Yes Paduchowski, Lennette Bihari, MD  albuterol (VENTOLIN HFA) 108 (90 Base) MCG/ACT inhaler Inhale 2 puffs into the lungs every 4 (four) hours as needed for wheezing or shortness of breath. 05/14/19   Paulette Blanch, MD    Review of Systems  Constitutional: Negative.   HENT: Negative.   Eyes: Negative.   Respiratory: Negative.   Cardiovascular: Negative.   Gastrointestinal: Positive for abdominal pain. Negative for blood in stool, constipation, diarrhea, heartburn, melena, nausea and vomiting.  Genitourinary: Negative.   Musculoskeletal: Negative.   Skin: Negative.   Neurological: Negative.   Psychiatric/Behavioral: Negative.      Physical Exam BP 92/66    Pulse 66    Ht 5\' 1"  (1.549 m)    Wt 97 lb 3.2 oz (44.1 kg)    LMP 06/21/2019    BMI  18.37 kg/m  Patient's last menstrual period was 06/21/2019. Physical Exam Constitutional:      General: She is in acute distress (mild).     Appearance: Normal appearance. She is well-developed.  Genitourinary:     Genitourinary Comments: Deferred as she has had multiple recent pelvic exams  HENT:     Head: Normocephalic  and atraumatic.  Eyes:     General: No scleral icterus.    Conjunctiva/sclera: Conjunctivae normal.  Neck:     Musculoskeletal: Normal range of motion and neck supple.  Cardiovascular:     Rate and Rhythm: Normal rate and regular rhythm.     Heart sounds: No murmur. No friction rub. No gallop.   Pulmonary:     Effort: Pulmonary effort is normal. No respiratory distress.     Breath sounds: Normal breath sounds. No wheezing or rales.  Abdominal:     General: Bowel sounds are normal. There is no distension.     Palpations: Abdomen is soft. There is no mass.     Tenderness: There is abdominal tenderness (over suprapubic region generally). There is no right CVA tenderness, left CVA tenderness, guarding or rebound.  Musculoskeletal: Normal range of motion.  Neurological:     General: No focal deficit present.     Mental Status: She is alert and oriented to person, place, and time.     Cranial Nerves: No cranial nerve deficit.  Skin:    General: Skin is warm and dry.     Findings: No erythema.  Psychiatric:        Mood and Affect: Mood normal.        Behavior: Behavior normal.        Judgment: Judgment normal.     Female chaperone present for pelvic and breast  portions of the physical exam  Assessment: 23 y.o. G62P0010 female here for  1. Chronic pelvic pain in female      Plan: Problem List Items Addressed This Visit    None    Visit Diagnoses    Chronic pelvic pain in female    -  Primary   Relevant Medications   clonazePAM (KLONOPIN) 0.5 MG tablet     I spent greater than 30 minutes reviewing her chart in order to establish her medical history and  care she has received in the past year, including her recent emergency room visits.  This patient has a complicated clinical history made more complicated by factors such as either her mother or father being present and speaking on the patient's behalf.  The patient has a long history of narcotic use and it appears this pain that she is experiencing started around the time she stopped using her Percocet.  This is per the patient's explanation of her pain and she agrees with the timeline.  She was told today in no uncertain terms that no narcotic pain medication would be given to treat her endometriosis as opioids for chronic pain of gynecologic nature are not beneficial and may cause harm.  We discussed that non-opioid medications could be used to treat her pain.  I recommended a 8 to 12-week follow-up from her prior ultrasound to assess for resolution of her cyst, though I did let her know that hemorrhagic cysts are common and a cyst of the size noted on her ultrasound almost always resolves on its own.  We also discussed her multiple OB/GYN providers over the past year.  My strong recommendation to her was to pick 1 OB/GYN provider and stay with that person to get consistent treatment for her pain.  She voiced understanding of all the above.  She states that she will keep her appointment with Dr. Leonides Schanz next month and decide which OB/GYN she will utilize.  30 minutes spent in face to face discussion with > 50% spent in counseling,management, and coordination of care of  her chronic pelvic pain in female.   Prentice Docker, MD 06/26/2019 1:33 PM

## 2019-07-13 ENCOUNTER — Emergency Department
Admission: EM | Admit: 2019-07-13 | Discharge: 2019-07-13 | Disposition: A | Discharge status: Home / Self Care | Payer: Medicare Other | Attending: Emergency Medicine | Admitting: Emergency Medicine

## 2019-07-13 ENCOUNTER — Other Ambulatory Visit: Payer: Self-pay

## 2019-07-13 DIAGNOSIS — E039 Hypothyroidism, unspecified: Secondary | ICD-10-CM | POA: Insufficient documentation

## 2019-07-13 DIAGNOSIS — F17210 Nicotine dependence, cigarettes, uncomplicated: Secondary | ICD-10-CM | POA: Insufficient documentation

## 2019-07-13 DIAGNOSIS — G8929 Other chronic pain: Secondary | ICD-10-CM | POA: Diagnosis not present

## 2019-07-13 DIAGNOSIS — Z79899 Other long term (current) drug therapy: Secondary | ICD-10-CM | POA: Diagnosis not present

## 2019-07-13 DIAGNOSIS — J45909 Unspecified asthma, uncomplicated: Secondary | ICD-10-CM | POA: Insufficient documentation

## 2019-07-13 DIAGNOSIS — R102 Pelvic and perineal pain: Secondary | ICD-10-CM | POA: Insufficient documentation

## 2019-07-13 LAB — CBC
HCT: 42.9 % (ref 36.0–46.0)
Hemoglobin: 15 g/dL (ref 12.0–15.0)
MCH: 32.7 pg (ref 26.0–34.0)
MCHC: 35 g/dL (ref 30.0–36.0)
MCV: 93.5 fL (ref 80.0–100.0)
Platelets: 265 10*3/uL (ref 150–400)
RBC: 4.59 MIL/uL (ref 3.87–5.11)
RDW: 11.1 % — ABNORMAL LOW (ref 11.5–15.5)
WBC: 7.1 10*3/uL (ref 4.0–10.5)
nRBC: 0 % (ref 0.0–0.2)

## 2019-07-13 LAB — COMPREHENSIVE METABOLIC PANEL
ALT: 21 U/L (ref 0–44)
AST: 28 U/L (ref 15–41)
Albumin: 4.7 g/dL (ref 3.5–5.0)
Alkaline Phosphatase: 40 U/L (ref 38–126)
Anion gap: 9 (ref 5–15)
BUN: 9 mg/dL (ref 6–20)
CO2: 22 mmol/L (ref 22–32)
Calcium: 9 mg/dL (ref 8.9–10.3)
Chloride: 107 mmol/L (ref 98–111)
Creatinine, Ser: 0.59 mg/dL (ref 0.44–1.00)
GFR calc Af Amer: >60 mL/min (ref >60)
GFR calc non Af Amer: >60 mL/min (ref >60)
Glucose, Bld: 78 mg/dL (ref 70–99)
Potassium: 3.9 mmol/L (ref 3.5–5.1)
Sodium: 138 mmol/L (ref 135–145)
Total Bilirubin: 1.4 mg/dL — ABNORMAL HIGH (ref 0.3–1.2)
Total Protein: 8 g/dL (ref 6.5–8.1)

## 2019-07-13 LAB — LIPASE, BLOOD: Lipase: 29 U/L (ref 11–51)

## 2019-07-13 LAB — URINALYSIS, COMPLETE (UACMP) WITH MICROSCOPIC
Bacteria, UA: NONE SEEN
Bilirubin Urine: NEGATIVE
Glucose, UA: NEGATIVE mg/dL
Ketones, ur: NEGATIVE mg/dL
Nitrite: NEGATIVE
Protein, ur: NEGATIVE mg/dL
Specific Gravity, Urine: 1.018 (ref 1.005–1.030)
pH: 7 (ref 5.0–8.0)

## 2019-07-13 LAB — SAMPLE TO BLOOD BANK

## 2019-07-13 LAB — POCT PREGNANCY, URINE
Preg Test, Ur: NEGATIVE
Preg Test, Ur: NEGATIVE

## 2019-07-13 MED ORDER — KETOROLAC TROMETHAMINE 30 MG/ML IJ SOLN
30.0000 mg | Freq: Once | INTRAMUSCULAR | Status: AC
  Administered 2019-07-13: 30 mg via INTRAVENOUS
  Filled 2019-07-13: qty 1

## 2019-07-13 NOTE — ED Notes (Signed)
Pt alert  Iv in place  Family with pt.  Pt reports abnormal labs and was sent to er for eval.

## 2019-07-13 NOTE — ED Provider Notes (Addendum)
[facility] Emergency Department Provider Note  ____________________________________________   First MD Initiated Contact with Patient 07/13/19 1828     (approximate)  I have reviewed the triage vital signs and the nursing notes.   HISTORY  Chief Complaint Abnormal Lab and Abdominal Pain    HPI Hannah Shaw is a 23 y.o. female with chronic lower abdominal pain secondary to endometriosis and cyst followed closely by OB/GYN who presents with abnormal lab.  Patient was told that her hemoglobin was low.  She said she had the hemoglobin drawn a while ago but just got a call about it yesterday.  She has had abnormal vaginal bleeding that she follows with OB for.  Her bleeding is very light at this time and almost gone.  Her abdominal pain is her baseline abdominal pain is in her pelvic region, constant, nothing makes it better, nothing makes it worse.  Feels consistent with her endometriosis.  She has been seen by OB and the pain clinic and there is some concern that she was narcotic seeking in the past.    Past Medical History:  Diagnosis Date  . Asthma    WELL CONTROLLED  . Chronic kidney disease    H/O STONES  . Endometriosis   . GERD (gastroesophageal reflux disease)   . Headache   . Hypothyroidism   . Thyroid disease     Patient Active Problem List   Diagnosis Date Noted  . 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 APPENDECTOMY    . ENDOMETRIAL BIOPSY    . LAPAROSCOPY N/A 03/13/2016   Procedure: LAPAROSCOPY DIAGNOSTIC;  Surgeon: Honor Loh Ward, MD;  Location: [facility] ORS;  Service: Gynecology;  Laterality: N/A;    Prior to Admission medications   Medication Sig Start Date End Date Taking? Authorizing Provider  albuterol (VENTOLIN HFA) 108 (90 Base) MCG/ACT inhaler Inhale 2 puffs into the lungs every 4 (four) hours as needed for wheezing or shortness of breath.  05/14/19   Paulette Blanch, MD  clonazePAM (KLONOPIN) 0.5 MG tablet Take 0.5 mg by mouth 2 (two) times daily as needed for anxiety.    [provider]  desogestrel-ethinyl estradiol (MIRCETTE) 0.15-0.02/0.01 MG (21/5) tablet Take 1 tablet by mouth at bedtime. Patient not taking: Reported on 06/26/2019 12/20/18   Jonnie Kind, MD  levothyroxine (SYNTHROID, LEVOTHROID) 25 MCG tablet Take 25 mcg by mouth daily before breakfast.     [provider]  medroxyPROGESTERone (PROVERA) 10 MG tablet Take 10 mg by mouth daily. 07/07/19   [provider]  meloxicam (MOBIC) 15 MG tablet Take 1 tablet (15 mg total) by mouth daily. 06/21/19 07/21/19  Harvest Dark, MD  Norethindrone-Ethinyl Estradiol-Fe Biphas (LO LOESTRIN FE) 1 MG-10 MCG / 10 MCG tablet Take 1 tablet by mouth daily. Patient not taking: Reported on 06/26/2019 05/17/19   Jonnie Kind, MD  oxyCODONE-acetaminophen (PERCOCET) 7.5-325 MG tablet Take 1 tablet by mouth every 6 (six) hours as needed for severe pain. Patient not taking: Reported on 07/13/2019 01/26/19   Jonnie Kind, MD  prazosin (MINIPRESS) 1 MG capsule Take 1 mg by mouth 2 (two) times daily. 06/08/19   [provider]    Allergies Morphine and related, Fentanyl, Betadine [povidone iodine], and Iodine  Family History  Problem Relation Age of Onset  . Cervical cancer Mother   . Lung cancer Paternal Uncle     Social  History Social History   Tobacco Use  . Smoking status: Current Some Day Smoker    Packs/day: 0.25    Types: Cigarettes  . Smokeless tobacco: Never Used  Substance Use Topics  . Alcohol use: No  . Drug use: Never      Review of Systems Constitutional: No fever/chills Eyes: No visual changes. ENT: No sore throat. Cardiovascular: Denies chest pain. Respiratory: Denies shortness of breath. Gastrointestinal: Lower abdominal pain.  no nausea, no vomiting.  No diarrhea.  No constipation. Genitourinary: Negative for  dysuria. Musculoskeletal: Negative for back pain. Skin: Negative for rash. Neurological: Negative for headaches, focal weakness or numbness. All other ROS negative ____________________________________________   PHYSICAL EXAM:  VITAL SIGNS: ED Triage Vitals  Enc Vitals Group     BP 07/13/19 1539 128/76     Pulse Rate 07/13/19 1539 (!) 110     Resp 07/13/19 1539 16     Temp 07/13/19 1539 100 F (37.8 C)     Temp Source 07/13/19 1539 Oral     SpO2 07/13/19 1539 100 %     Weight 07/13/19 1540 97 lb (44 kg)     Height 07/13/19 1540 5\' 1"  (1.549 m)     Head Circumference --      Peak Flow --      Pain Score 07/13/19 1539 10     Pain Loc --      Pain Edu? --      Excl. in [place]? --     Constitutional: Alert and oriented. Well appearing and in no acute distress. Eyes: Conjunctivae are normal. EOMI. Head: Atraumatic. Nose: No congestion/rhinnorhea. Mouth/Throat: Mucous membranes are moist.   Neck: No stridor. Trachea Midline. FROM Cardiovascular: Normal rate, regular rhythm. Grossly normal heart sounds.  Good peripheral circulation. Respiratory: Normal respiratory effort.  No retractions. Lungs CTAB. Gastrointestinal: Soft No distention. No abdominal bruits.  Lower abdominal pain/pelvic pain without any rebound or guarding Musculoskeletal: No lower extremity tenderness nor edema.  No joint effusions.  No flank tenderness. Neurologic:  Normal speech and language. No gross focal neurologic deficits are appreciated.  Skin:  Skin is warm, dry and intact. No rash noted. Psychiatric: Mood and affect are normal. Speech and behavior are normal. GU: Deferred   ____________________________________________   LABS (all labs ordered are listed, but only abnormal results are displayed)  Labs Reviewed  COMPREHENSIVE METABOLIC PANEL - Abnormal; Notable for the following components:      Result Value   Total Bilirubin 1.4 (*)    All other components within normal limits  CBC - Abnormal;  Notable for the following components:   RDW 11.1 (*)    All other components within normal limits  URINALYSIS, COMPLETE (UACMP) WITH MICROSCOPIC - Abnormal; Notable for the following components:   Color, Urine YELLOW (*)    APPearance HAZY (*)    Hgb urine dipstick MODERATE (*)    Leukocytes,Ua MODERATE (*)    All other components within normal limits  LIPASE, BLOOD  POC URINE PREG, ED  POCT PREGNANCY, URINE  POCT PREGNANCY, URINE  SAMPLE TO BLOOD BANK   __ INITIAL IMPRESSION / ASSESSMENT AND PLAN / ED COURSE  SIRIYAH MACLAREN was evaluated in Emergency Department on 07/13/2019 for the symptoms described in the history of present illness. She was evaluated in the context of the global COVID-19 pandemic, which necessitated consideration that the patient might be at risk for infection with the SARS-CoV-2 virus that causes COVID-19. Institutional protocols and algorithms that pertain  to the evaluation of patients at risk for COVID-19 are in a state of rapid change based on information released by regulatory bodies including the CDC and federal and state organizations. These policies and algorithms were followed during the patient's care in the ED.    Patient is a well-appearing 23 year old who has had multiple visits for abdominal pain most likely secondary to her endometriosis and history of cyst.  Her pain is at baseline at this time.  She is not had any new vomiting or flank tenderness.  She says that she came in only because she was told that she had a low hemoglobin level that she might need a transfusion.  She is unsure exactly when she had this blood drawn but sounds that it was sometime ago.  Will get repeat labs to evaluate for anemia.  Will get labs evaluate for pancreatitis, cholecystitis although she is got no upper abdominal pain.  Patient was slightly tachycardic when she first got here.  Will repeat vital signs to ensure that this is resolved.  Get a pregnancy test as well.  Pregnancy  test is negative.  Lipase is normal.  Slightly elevated total bili but there was hemolysis and patient is not having right upper quadrant pain white count is normal.  No evidence of anemia.  Urine with some RBCs but patient said that the majority of her period stopped yesterday and she is still having a little bit of spotting so most likely the RBCs is from that.  She has no flank tenderness to suggest kidney stones.  Hemoglobin is normal so unclear why she was told it was low.  If it was taking a long time ago it is possible that she did have a low hemoglobin is setting of her vaginal bleeding but now she has done better versus a lab error.   6:58 PM patient noted to have a temperature of 100.  Other vitals are normal.  Patient denies any other infectious symptoms such as UTI, pneumonia.  She already had her appendix removed.  She says that her pelvic pain is at baseline. Declines concern for STD to suggest PID.  No white count and pelvic pain at baseline so unlikely tubo-ovarian abscesss. We discussed CT scan but given this is her baseline pelvic pain and she has no other symptoms and this is not a true fever patient would like to hold off at this time.  She then requested some IV Dilaudid for her pain.  I discussed with patient that I cannot give opioids but would give her a dose of Toradol.  Patient feels comfortable going home after this.  I explained that if.  Patient's fever goes up or she develops any other new symptoms she should return to the ER.  She declined coronavirus testing at this time given she has no other symptoms.  I discussed the provisional nature of ED diagnosis, the treatment so far, the ongoing plan of care, follow up appointments and return precautions with the patient and any family or support people present. They expressed understanding and agreed with the plan, discharged home.   ____________________________________________   FINAL CLINICAL IMPRESSION(S) / ED DIAGNOSES    Final diagnoses:  Chronic pelvic pain in female      MEDICATIONS GIVEN DURING THIS VISIT:  Medications  ketorolac (TORADOL) 30 MG/ML injection 30 mg (has no administration in time range)     ED Discharge Orders    None       Note:  This document  was prepared using Systems analyst and may include unintentional dictation errors.   Vanessa [place], MD 07/13/19 1901    Vanessa , MD 07/13/19 [phone]

## 2019-07-13 NOTE — ED Notes (Signed)
ED Provider at bedside. 

## 2019-07-13 NOTE — ED Triage Notes (Signed)
Reports that she was told to come to ER due to abnormal labs, possibly hemoglobin. Pt unsure. Pt reports heavy menstrual cycle over last 2 months. Pt alert and oriented X4, cooperative, RR even and unlabored, color WNL. Pt in NAD.

## 2019-07-13 NOTE — Discharge Instructions (Addendum)
Your work-up was reassuring including normal hemoglobin.  He should follow-up with your [facility] doctor for your pelvic pain.  Return to the ER he develop vomiting, fevers over 100.4, any other new symptoms.  Your temperature here was 100.0.  This is borderline elevated.  If you develop any other new symptoms with his fever you should return for further work-up.

## 2019-08-01 ENCOUNTER — Other Ambulatory Visit: Payer: Self-pay

## 2019-08-01 ENCOUNTER — Emergency Department: Payer: Medicare Other

## 2019-08-01 ENCOUNTER — Encounter: Payer: Self-pay | Admitting: *Deleted

## 2019-08-01 ENCOUNTER — Emergency Department
Admission: EM | Admit: 2019-08-01 | Discharge: 2019-08-01 | Disposition: A | Discharge status: Home / Self Care | Payer: Medicare Other | Attending: Emergency Medicine | Admitting: Emergency Medicine

## 2019-08-01 DIAGNOSIS — E039 Hypothyroidism, unspecified: Secondary | ICD-10-CM | POA: Insufficient documentation

## 2019-08-01 DIAGNOSIS — R1033 Periumbilical pain: Secondary | ICD-10-CM | POA: Diagnosis not present

## 2019-08-01 DIAGNOSIS — Z79899 Other long term (current) drug therapy: Secondary | ICD-10-CM | POA: Diagnosis not present

## 2019-08-01 DIAGNOSIS — R1084 Generalized abdominal pain: Secondary | ICD-10-CM | POA: Diagnosis present

## 2019-08-01 DIAGNOSIS — J45909 Unspecified asthma, uncomplicated: Secondary | ICD-10-CM | POA: Insufficient documentation

## 2019-08-01 DIAGNOSIS — F17210 Nicotine dependence, cigarettes, uncomplicated: Secondary | ICD-10-CM | POA: Diagnosis not present

## 2019-08-01 LAB — CBC WITH DIFFERENTIAL/PLATELET
Abs Immature Granulocytes: 0.03 10*3/uL (ref 0.00–0.07)
Basophils Absolute: 0 10*3/uL (ref 0.0–0.1)
Basophils Relative: 0 %
Eosinophils Absolute: 0 10*3/uL (ref 0.0–0.5)
Eosinophils Relative: 0 %
HCT: 43.3 % (ref 36.0–46.0)
Hemoglobin: 15.4 g/dL — ABNORMAL HIGH (ref 12.0–15.0)
Immature Granulocytes: 0 %
Lymphocytes Relative: 24 %
Lymphs Abs: 2.4 10*3/uL (ref 0.7–4.0)
MCH: 33.2 pg (ref 26.0–34.0)
MCHC: 35.6 g/dL (ref 30.0–36.0)
MCV: 93.3 fL (ref 80.0–100.0)
Monocytes Absolute: 0.7 10*3/uL (ref 0.1–1.0)
Monocytes Relative: 7 %
Neutro Abs: 6.8 10*3/uL (ref 1.7–7.7)
Neutrophils Relative %: 69 %
Platelets: 322 10*3/uL (ref 150–400)
RBC: 4.64 MIL/uL (ref 3.87–5.11)
RDW: 11.4 % — ABNORMAL LOW (ref 11.5–15.5)
WBC: 9.9 10*3/uL (ref 4.0–10.5)
nRBC: 0 % (ref 0.0–0.2)

## 2019-08-01 LAB — URINALYSIS, COMPLETE (UACMP) WITH MICROSCOPIC
Bacteria, UA: NONE SEEN
Bilirubin Urine: NEGATIVE
Glucose, UA: NEGATIVE mg/dL
Ketones, ur: NEGATIVE mg/dL
Leukocytes,Ua: NEGATIVE
Nitrite: NEGATIVE
Protein, ur: NEGATIVE mg/dL
Specific Gravity, Urine: 1.005 (ref 1.005–1.030)
pH: 6 (ref 5.0–8.0)

## 2019-08-01 LAB — COMPREHENSIVE METABOLIC PANEL
ALT: 16 U/L (ref 0–44)
AST: 17 U/L (ref 15–41)
Albumin: 4.7 g/dL (ref 3.5–5.0)
Alkaline Phosphatase: 41 U/L (ref 38–126)
Anion gap: 11 (ref 5–15)
BUN: 6 mg/dL (ref 6–20)
CO2: 22 mmol/L (ref 22–32)
Calcium: 9.2 mg/dL (ref 8.9–10.3)
Chloride: 103 mmol/L (ref 98–111)
Creatinine, Ser: 0.49 mg/dL (ref 0.44–1.00)
GFR calc Af Amer: >60 mL/min (ref >60)
GFR calc non Af Amer: >60 mL/min (ref >60)
Glucose, Bld: 96 mg/dL (ref 70–99)
Potassium: 3.2 mmol/L — ABNORMAL LOW (ref 3.5–5.1)
Sodium: 136 mmol/L (ref 135–145)
Total Bilirubin: 1.1 mg/dL (ref 0.3–1.2)
Total Protein: 7.9 g/dL (ref 6.5–8.1)

## 2019-08-01 LAB — CBC
HCT: 43.2 % (ref 36.0–46.0)
Hemoglobin: 15.4 g/dL — ABNORMAL HIGH (ref 12.0–15.0)
MCH: 33 pg (ref 26.0–34.0)
MCHC: 35.6 g/dL (ref 30.0–36.0)
MCV: 92.7 fL (ref 80.0–100.0)
Platelets: 307 10*3/uL (ref 150–400)
RBC: 4.66 MIL/uL (ref 3.87–5.11)
RDW: 11.4 % — ABNORMAL LOW (ref 11.5–15.5)
WBC: 10.2 10*3/uL (ref 4.0–10.5)
nRBC: 0 % (ref 0.0–0.2)

## 2019-08-01 LAB — PREGNANCY, URINE: Preg Test, Ur: NEGATIVE

## 2019-08-01 LAB — LIPASE, BLOOD: Lipase: 22 U/L (ref 11–51)

## 2019-08-01 MED ORDER — OXYCODONE-ACETAMINOPHEN 5-325 MG PO TABS
1.5000 | ORAL_TABLET | Freq: Once | ORAL | Status: AC
  Administered 2019-08-01: 1.5 via ORAL
  Filled 2019-08-01 (×2): qty 2

## 2019-08-01 MED ORDER — OXYCODONE-ACETAMINOPHEN 7.5-325 MG PO TABS: 1.0000 | ORAL_TABLET | Freq: Once | ORAL | Status: DC

## 2019-08-01 NOTE — ED Provider Notes (Signed)
[facility] Emergency Department Provider Note   ____________________________________________   First MD Initiated Contact with Patient 08/01/19 1757     (approximate)  I have reviewed the triage vital signs and the nursing notes.   HISTORY  Chief Complaint Abdominal Pain  HPI Hannah Shaw is a 23 y.o. female who reports she was at her gastroenterology appointment for belly pain when she was found to have a low-grade fever of 100.8 and sent here.  She reports she has some abdominal pain just under the umbilicus which is the same pain she is always had.  This is been attributed to endometriosis in the past.  The pain is the same as it is always been its not any different.  The only difference was the fever.  She is not having any cough shortness of breath chest pain dysuria or any other complaints.  Fever is now gone.   Pain is deep and achy same as always.      Past Medical History:  Diagnosis Date  . Asthma    WELL CONTROLLED  . Chronic kidney disease    H/O STONES  . Endometriosis   . GERD (gastroesophageal reflux disease)   . Headache   . Hypothyroidism   . Thyroid disease     Patient Active Problem List   Diagnosis Date Noted  . 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 APPENDECTOMY    . ENDOMETRIAL BIOPSY    . LAPAROSCOPY N/A 03/13/2016   Procedure: LAPAROSCOPY DIAGNOSTIC;  Surgeon: Honor Loh Ward, MD;  Location: [facility] ORS;  Service: Gynecology;  Laterality: N/A;    Prior to Admission medications   Medication Sig Start Date End Date Taking? Authorizing Provider  albuterol (VENTOLIN HFA) 108 (90 Base) MCG/ACT inhaler Inhale 2 puffs into the lungs every 4 (four) hours as needed for wheezing or shortness of breath. 05/14/19   Paulette Blanch, MD  clonazePAM (KLONOPIN) 0.5 MG tablet Take 0.5 mg by mouth 2 (two) times daily as needed for anxiety.     [provider]  desogestrel-ethinyl estradiol (MIRCETTE) 0.15-0.02/0.01 MG (21/5) tablet Take 1 tablet by mouth at bedtime. Patient not taking: Reported on 06/26/2019 12/20/18   Jonnie Kind, MD  levothyroxine (SYNTHROID, LEVOTHROID) 25 MCG tablet Take 25 mcg by mouth daily before breakfast.     [provider]  medroxyPROGESTERone (PROVERA) 10 MG tablet Take 10 mg by mouth daily. 07/07/19   [provider]  Norethindrone-Ethinyl Estradiol-Fe Biphas (LO LOESTRIN FE) 1 MG-10 MCG / 10 MCG tablet Take 1 tablet by mouth daily. Patient not taking: Reported on 06/26/2019 05/17/19   Jonnie Kind, MD  oxyCODONE-acetaminophen (PERCOCET) 7.5-325 MG tablet Take 1 tablet by mouth every 6 (six) hours as needed for severe pain. Patient not taking: Reported on 07/13/2019 01/26/19   Jonnie Kind, MD  prazosin (MINIPRESS) 1 MG capsule Take 1 mg by mouth 2 (two) times daily. 06/08/19   [provider]    Allergies Morphine and related, Fentanyl, Betadine [povidone iodine], and Iodine  Family History  Problem Relation Age of Onset  . Cervical cancer Mother   . Lung cancer Paternal Uncle     Social History Social History   Tobacco Use  . Smoking status: Current Some Day Smoker    Packs/day: 0.25    Types: Cigarettes  . Smokeless tobacco: Never Used  Substance Use Topics  . Alcohol  use: No  . Drug use: Never    Review of Systems  Constitutional: fever low-grade Eyes: No visual changes. ENT: No sore throat. Cardiovascular: Denies chest pain. Respiratory: Denies shortness of breath. Gastrointestinal: Chronic abdominal pain.  No nausea, no vomiting.  No diarrhea.  No constipation. Genitourinary: Negative for dysuria. Musculoskeletal: Negative for back pain. Skin: Negative for rash. Neurological: Negative for headaches, focal weakness   ____________________________________________   PHYSICAL EXAM:  VITAL SIGNS: ED Triage Vitals [08/01/19 1603]  Enc  Vitals Group     BP 132/78     Pulse Rate (!) 106     Resp 16     Temp 99.8 F (37.7 C)     Temp Source Oral     SpO2 98 %     Weight 101 lb (45.8 kg)     Height 5\' 2"  (1.575 m)     Head Circumference      Peak Flow      Pain Score 10     Pain Loc      Pain Edu?      Excl. in [place]?     Constitutional: Alert and oriented. Well appearing and in no acute distress. Eyes: Conjunctivae are normal.  Head: Atraumatic. Nose: No congestion/rhinnorhea. Mouth/Throat: Mucous membranes are moist.  Oropharynx non-erythematous. Neck: No stridor. Cardiovascular: Normal rate, regular rhythm. Grossly normal heart sounds.  Good peripheral circulation. Respiratory: Normal respiratory effort.  No retractions. Lungs CTAB. Gastrointestinal: Soft moderately tender to palpation and percussion just below the umbilicus.  No distention. No abdominal bruits. No CVA tenderness.  There is no suprapubic tenderness. Musculoskeletal: No lower extremity tenderness nor edema.  No joint effusions. Neurologic:  Normal speech and language. No gross focal neurologic deficits are appreciated.  Skin:  Skin is warm, dry and intact. No rash noted.   ____________________________________________   LABS (all labs ordered are listed, but only abnormal results are displayed)  Labs Reviewed  COMPREHENSIVE METABOLIC PANEL - Abnormal; Notable for the following components:      Result Value   Potassium 3.2 (*)    All other components within normal limits  CBC - Abnormal; Notable for the following components:   Hemoglobin 15.4 (*)    RDW 11.4 (*)    All other components within normal limits  URINALYSIS, COMPLETE (UACMP) WITH MICROSCOPIC - Abnormal; Notable for the following components:   Color, Urine STRAW (*)    APPearance CLEAR (*)    Hgb urine dipstick MODERATE (*)    All other components within normal limits  CBC WITH DIFFERENTIAL/PLATELET - Abnormal; Notable for the following components:   Hemoglobin 15.4 (*)     RDW 11.4 (*)    All other components within normal limits  LIPASE, BLOOD  PREGNANCY, URINE  POC URINE PREG, ED   ____________________________________________  EKG   ____________________________________________  RADIOLOGY  ED MD interpretation: Abdominal films read by radiology reviewed by me do not show any acute disease.  Official radiology report(s): Dg Abdomen Acute W/chest  Result Date: 08/01/2019 CLINICAL DATA:  Abdomen pain. EXAM: DG ABDOMEN ACUTE W/ 1V CHEST COMPARISON:  None. FINDINGS: There is no evidence of dilated bowel loops or free intraperitoneal air. No radiopaque calculi or other significant radiographic abnormality is seen. Heart size and mediastinal contours are within normal limits. Both lungs are clear. IMPRESSION: Negative abdominal radiographs.  No acute cardiopulmonary disease. Electronically Signed   By: Abelardo Diesel M.D.   On: 08/01/2019 19:38    ____________________________________________   PROCEDURES  Procedure(s) performed (including Critical Care):  Procedures   ____________________________________________   INITIAL IMPRESSION / ASSESSMENT AND PLAN / ED COURSE  MARILLYN TESH was evaluated in Emergency Department on 08/01/2019 for the symptoms described in the history of present illness. She was evaluated in the context of the global COVID-19 pandemic, which necessitated consideration that the patient might be at risk for infection with the SARS-CoV-2 virus that causes COVID-19. Institutional protocols and algorithms that pertain to the evaluation of patients at risk for COVID-19 are in a state of rapid change based on information released by regulatory bodies including the CDC and federal and state organizations. These policies and algorithms were followed during the patient's care in the ED.     Patient reports she has Percocet at home which she takes for her pain.  She says the pain is the same as it always is with from her endometriosis.  Her  lab work is essentially normal.  She is no longer running a fever.  She is not tachycardic now.  She looks well and feels at baseline.  I will let her go home.  She will return for any further problems.         ____________________________________________   FINAL CLINICAL IMPRESSION(S) / ED DIAGNOSES  Final diagnoses:  Periumbilical abdominal pain     ED Discharge Orders    None       Note:  This document was prepared using Dragon voice recognition software and may include unintentional dictation errors.    Nena Polio, MD 08/01/19 1958

## 2019-08-01 NOTE — ED Notes (Signed)
boyfriend at bedside at this time.

## 2019-08-01 NOTE — ED Notes (Signed)
Pt ambulatory to toilet with a steady gait. 

## 2019-08-01 NOTE — ED Notes (Signed)
Pt providde with phone by this RN to call significant other.

## 2019-08-01 NOTE — ED Triage Notes (Signed)
Pt to ED reporting abd pain and tenderness for "a while" with a hx of endometriosis.   Pt was at GI doctor today but was sent to ED because they reported pt had a temp of 100.8. Upon arrival to ED pts temp is 99.8.

## 2019-08-01 NOTE — ED Notes (Signed)
Patient transported to X-ray 

## 2019-08-01 NOTE — Discharge Instructions (Addendum)
Please return for worsening pain, fever or vomiting.  Please also return if you get a cough or shortness of breath.  Please follow-up with your doctors.  Lab work and x-rays we did here today look okay.

## 2019-08-01 NOTE — ED Notes (Signed)
Pt st taking tylenol 2hrs ago for "the pain".

## 2019-08-29 ENCOUNTER — Other Ambulatory Visit: Payer: Self-pay

## 2019-08-29 ENCOUNTER — Other Ambulatory Visit
Admission: RE | Admit: 2019-08-29 | Discharge: 2019-08-29 | Disposition: A | Discharge status: Home / Self Care | Payer: Medicare Other | Source: Ambulatory Visit | Attending: General Surgery | Admitting: General Surgery

## 2019-08-29 DIAGNOSIS — Z01812 Encounter for preprocedural laboratory examination: Secondary | ICD-10-CM | POA: Insufficient documentation

## 2019-08-29 DIAGNOSIS — Z20828 Contact with and (suspected) exposure to other viral communicable diseases: Secondary | ICD-10-CM | POA: Insufficient documentation

## 2019-08-29 LAB — SARS CORONAVIRUS 2 (TAT 6-24 HRS): SARS Coronavirus 2: NEGATIVE

## 2019-08-31 ENCOUNTER — Encounter: Payer: Self-pay | Admitting: *Deleted

## 2019-09-05 ENCOUNTER — Other Ambulatory Visit
Admission: RE | Admit: 2019-09-05 | Discharge: 2019-09-05 | Disposition: A | Discharge status: Home / Self Care | Payer: Medicare Other | Source: Ambulatory Visit | Attending: General Surgery | Admitting: General Surgery

## 2019-09-05 DIAGNOSIS — Z20828 Contact with and (suspected) exposure to other viral communicable diseases: Secondary | ICD-10-CM | POA: Insufficient documentation

## 2019-09-05 DIAGNOSIS — Z01812 Encounter for preprocedural laboratory examination: Secondary | ICD-10-CM | POA: Diagnosis present

## 2019-09-05 LAB — SARS CORONAVIRUS 2 (TAT 6-24 HRS): SARS Coronavirus 2: NEGATIVE

## 2019-09-07 ENCOUNTER — Encounter: Payer: Self-pay | Admitting: *Deleted

## 2019-09-08 ENCOUNTER — Encounter
Admission: RE | Disposition: A | Discharge status: Home / Self Care | Payer: Self-pay | Source: Home / Self Care | Attending: General Surgery

## 2019-09-08 ENCOUNTER — Ambulatory Visit: Payer: Medicare Other | Admitting: Certified Registered"

## 2019-09-08 ENCOUNTER — Other Ambulatory Visit: Payer: Self-pay

## 2019-09-08 ENCOUNTER — Encounter: Payer: Self-pay | Admitting: *Deleted

## 2019-09-08 ENCOUNTER — Ambulatory Visit
Admission: RE | Admit: 2019-09-08 | Discharge: 2019-09-08 | Disposition: A | Discharge status: Home / Self Care | Payer: Medicare Other | Attending: General Surgery | Admitting: General Surgery

## 2019-09-08 DIAGNOSIS — E039 Hypothyroidism, unspecified: Secondary | ICD-10-CM | POA: Insufficient documentation

## 2019-09-08 DIAGNOSIS — N189 Chronic kidney disease, unspecified: Secondary | ICD-10-CM | POA: Diagnosis not present

## 2019-09-08 DIAGNOSIS — Z793 Long term (current) use of hormonal contraceptives: Secondary | ICD-10-CM | POA: Insufficient documentation

## 2019-09-08 DIAGNOSIS — Z79899 Other long term (current) drug therapy: Secondary | ICD-10-CM | POA: Insufficient documentation

## 2019-09-08 DIAGNOSIS — J45909 Unspecified asthma, uncomplicated: Secondary | ICD-10-CM | POA: Insufficient documentation

## 2019-09-08 DIAGNOSIS — Z79890 Hormone replacement therapy (postmenopausal): Secondary | ICD-10-CM | POA: Insufficient documentation

## 2019-09-08 DIAGNOSIS — R1084 Generalized abdominal pain: Secondary | ICD-10-CM | POA: Diagnosis not present

## 2019-09-08 DIAGNOSIS — K219 Gastro-esophageal reflux disease without esophagitis: Secondary | ICD-10-CM | POA: Diagnosis not present

## 2019-09-08 DIAGNOSIS — D125 Benign neoplasm of sigmoid colon: Secondary | ICD-10-CM | POA: Diagnosis not present

## 2019-09-08 DIAGNOSIS — K2970 Gastritis, unspecified, without bleeding: Secondary | ICD-10-CM | POA: Insufficient documentation

## 2019-09-08 DIAGNOSIS — K319 Disease of stomach and duodenum, unspecified: Secondary | ICD-10-CM | POA: Insufficient documentation

## 2019-09-08 DIAGNOSIS — F17210 Nicotine dependence, cigarettes, uncomplicated: Secondary | ICD-10-CM | POA: Insufficient documentation

## 2019-09-08 DIAGNOSIS — K529 Noninfective gastroenteritis and colitis, unspecified: Secondary | ICD-10-CM | POA: Insufficient documentation

## 2019-09-08 HISTORY — PX: ESOPHAGOGASTRODUODENOSCOPY (EGD) WITH PROPOFOL: SHX5813

## 2019-09-08 HISTORY — DX: Anxiety disorder, unspecified: F41.9

## 2019-09-08 HISTORY — DX: Personal history of other diseases of the female genital tract: Z87.42

## 2019-09-08 HISTORY — DX: Personal history of urinary calculi: Z87.442

## 2019-09-08 HISTORY — PX: COLONOSCOPY WITH PROPOFOL: SHX5780

## 2019-09-08 LAB — POCT PREGNANCY, URINE: Preg Test, Ur: NEGATIVE

## 2019-09-08 SURGERY — COLONOSCOPY WITH PROPOFOL
Anesthesia: General

## 2019-09-08 MED ORDER — MIDAZOLAM HCL 2 MG/2ML IJ SOLN
INTRAMUSCULAR | Status: DC | PRN
  Administered 2019-09-08: 2 mg via INTRAVENOUS

## 2019-09-08 MED ORDER — PHENYLEPHRINE HCL (PRESSORS) 10 MG/ML IV SOLN
INTRAVENOUS | Status: DC | PRN
  Administered 2019-09-08: 100 ug via INTRAVENOUS

## 2019-09-08 MED ORDER — PROPOFOL 10 MG/ML IV BOLUS
INTRAVENOUS | Status: DC | PRN
  Administered 2019-09-08: 10 mg via INTRAVENOUS
  Administered 2019-09-08: 70 mg via INTRAVENOUS

## 2019-09-08 MED ORDER — GLYCOPYRROLATE 0.2 MG/ML IJ SOLN
INTRAMUSCULAR | Status: DC | PRN
  Administered 2019-09-08: 0.2 mg via INTRAVENOUS

## 2019-09-08 MED ORDER — SODIUM CHLORIDE 0.9 % IV SOLN
INTRAVENOUS | Status: DC
  Administered 2019-09-08 (×2): via INTRAVENOUS

## 2019-09-08 MED ORDER — MIDAZOLAM HCL 2 MG/2ML IJ SOLN
INTRAMUSCULAR | Status: AC
  Filled 2019-09-08: qty 2

## 2019-09-08 MED ORDER — LIDOCAINE HCL (CARDIAC) PF 100 MG/5ML IV SOSY
PREFILLED_SYRINGE | INTRAVENOUS | Status: DC | PRN
  Administered 2019-09-08: 80 mg via INTRATRACHEAL

## 2019-09-08 MED ORDER — PROPOFOL 500 MG/50ML IV EMUL
INTRAVENOUS | Status: DC | PRN
  Administered 2019-09-08: 200 ug/kg/min via INTRAVENOUS

## 2019-09-08 MED ORDER — LIDOCAINE HCL (PF) 2 % IJ SOLN
INTRAMUSCULAR | Status: AC
  Filled 2019-09-08: qty 10

## 2019-09-08 NOTE — Transfer of Care (Signed)
Immediate Anesthesia Transfer of Care Note  Patient: Hannah Shaw  Procedure(s) Performed: COLONOSCOPY WITH PROPOFOL (N/A ) ESOPHAGOGASTRODUODENOSCOPY (EGD) WITH PROPOFOL (N/A )  Patient Location: Endoscopy Unit  Anesthesia Type:General  Level of Consciousness: drowsy and responds to stimulation  Airway & Oxygen Therapy: Patient Spontanous Breathing and Patient connected to face mask oxygen  Post-op Assessment: Report given to RN and Post -op Vital signs reviewed and stable  Post vital signs: Reviewed and stable  Last Vitals:  Vitals Value Taken Time  BP 104/56 09/08/19 0952  Temp    Pulse 78 09/08/19 0952  Resp 29 09/08/19 0953  SpO2 100 % 09/08/19 0952  Vitals shown include unvalidated device data.  Last Pain:  Vitals:   09/08/19 0832  TempSrc: Oral  PainSc: 9          Complications: No apparent anesthesia complications

## 2019-09-08 NOTE — Anesthesia Postprocedure Evaluation (Signed)
Anesthesia Post Note  Patient: COREENA TASSY  Procedure(s) Performed: COLONOSCOPY WITH PROPOFOL (N/A ) ESOPHAGOGASTRODUODENOSCOPY (EGD) WITH PROPOFOL (N/A )  Patient location during evaluation: Endoscopy Anesthesia Type: General Level of consciousness: awake and alert Pain management: pain level controlled Vital Signs Assessment: post-procedure vital signs reviewed and stable Respiratory status: spontaneous breathing, nonlabored ventilation, respiratory function stable and patient connected to nasal cannula oxygen Cardiovascular status: blood pressure returned to baseline and stable Postop Assessment: no apparent nausea or vomiting Anesthetic complications: no     Last Vitals:  Vitals:   09/08/19 1012 09/08/19 1022  BP: 112/69 128/85  Pulse: 88 92  Resp: (!) 21 18  Temp:    SpO2: 100% 100%    Last Pain:  Vitals:   09/08/19 1022  TempSrc:   PainSc: 0-No pain                 Precious Haws Lexine Jaspers

## 2019-09-08 NOTE — Anesthesia Preprocedure Evaluation (Signed)
Anesthesia Evaluation  Patient identified by MRN, date of birth, ID band Patient awake    Reviewed: Allergy & Precautions, H&P , NPO status , Patient's Chart, lab work & pertinent test results  History of Anesthesia Complications Negative for: history of anesthetic complications  Airway Mallampati: II  TM Distance: >3 FB Neck ROM: full    Dental  (+) Chipped, Poor Dentition, Missing   Pulmonary neg shortness of breath, asthma , Current Smoker and Patient abstained from smoking.,           Cardiovascular Exercise Tolerance: Good (-) angina(-) Past MI and (-) DOE negative cardio ROS       Neuro/Psych  Headaches, PSYCHIATRIC DISORDERS negative psych ROS   GI/Hepatic Neg liver ROS, GERD  Medicated and Controlled,  Endo/Other  Hypothyroidism   Renal/GU Renal disease  negative genitourinary   Musculoskeletal   Abdominal   Peds  Hematology negative hematology ROS (+)   Anesthesia Other Findings Past Medical History: No date: Anxiety No date: Asthma     Comment:  WELL CONTROLLED No date: Chronic kidney disease     Comment:  H/O STONES No date: Endometriosis No date: GERD (gastroesophageal reflux disease) No date: Headache No date: History of kidney stones No date: History of ovarian cyst No date: Hypothyroidism No date: Thyroid disease  Past Surgical History: No date: APPENDECTOMY No date: DIAGNOSTIC LAPAROSCOPY No date: ENDOMETRIAL BIOPSY 03/13/2016: LAPAROSCOPY; N/A     Comment:  Procedure: LAPAROSCOPY DIAGNOSTIC;  Surgeon: Honor Loh               Ward, MD;  Location: [facility] ORS;  Service: Gynecology;                Laterality: N/A;  BMI    Body Mass Index: 17.92 kg/m      Reproductive/Obstetrics negative OB ROS                             Anesthesia Physical Anesthesia Plan  ASA: III  Anesthesia Plan: General   Post-op Pain Management:    Induction: Intravenous  PONV  Risk Score and Plan: Propofol infusion and TIVA  Airway Management Planned: Natural Airway and Nasal Cannula  Additional Equipment:   Intra-op Plan:   Post-operative Plan:   Informed Consent: I have reviewed the patients History and Physical, chart, labs and discussed the procedure including the risks, benefits and alternatives for the proposed anesthesia with the patient or authorized representative who has indicated his/her understanding and acceptance.     Dental Advisory Given  Plan Discussed with: Anesthesiologist, CRNA and Surgeon  Anesthesia Plan Comments: (Patient consented for risks of anesthesia including but not limited to:  - adverse reactions to medications - risk of intubation if required - damage to teeth, lips or other oral mucosa - sore throat or hoarseness - Damage to heart, brain, lungs or loss of life  Patient voiced understanding.)        Anesthesia Quick Evaluation

## 2019-09-08 NOTE — Anesthesia Post-op Follow-up Note (Signed)
Anesthesia QCDR form completed.        

## 2019-09-08 NOTE — Op Note (Signed)
[facility] Gastroenterology Patient Name: Hannah Shaw Procedure Date: 09/08/2019 9:02 AM MRN: [id number] Account #: [id number] Date of Birth: 27-Jul-1996 Admit Type: Outpatient Age: 23 Room: [facility] ENDO ROOM 1 Gender: Female Note Status: Finalized Procedure:             Upper GI endoscopy Indications:           Generalized abdominal pain, Diarrhea Providers:             Robert Bellow, MD Referring MD:          [facility] (Referring MD) Medicines:             Monitored Anesthesia Care Complications:         No immediate complications. Procedure:             Pre-Anesthesia Assessment:                        - Prior to the procedure, a History and Physical was                         performed, and patient medications, allergies and                         sensitivities were reviewed. The patient's tolerance                         of previous anesthesia was reviewed.                        - The risks and benefits of the procedure and the                         sedation options and risks were discussed with the                         patient. All questions were answered and informed                         consent was obtained.                        After obtaining informed consent, the endoscope was                         passed under direct vision. Throughout the procedure,                         the patient's blood pressure, pulse, and oxygen                         saturations were monitored continuously. The Endoscope                         was introduced through the mouth, and advanced to the                         third part of duodenum. The upper GI endoscopy was  accomplished without difficulty. The patient tolerated                         the procedure well. Findings:      The esophagus was normal.      Diffuse mild inflammation characterized by erythema was found in the       prepyloric  region of the stomach. Biopsies were taken with a cold       forceps for histology.      The examined duodenum was normal. Biopsies of the mucosa of the third       portion of the duodenum completed. Impression:            - Normal esophagus.                        - Gastritis. Biopsied.                        - Normal examined duodenum. Recommendation:        - Perform a colonoscopy today. Procedure Code(s):     --- Professional ---                        [id number], Esophagogastroduodenoscopy, flexible,                         transoral; with biopsy, single or multiple Diagnosis Code(s):     --- Professional ---                        K29.70, Gastritis, unspecified, without bleeding                        R10.84, Generalized abdominal pain                        R19.7, Diarrhea, unspecified CPT copyright 2019 American Medical Association. All rights reserved. The codes documented in this report are preliminary and upon coder review may  be revised to meet current compliance requirements. Robert Bellow, MD 09/08/2019 9:20:49 AM This report has been signed electronically. Number of Addenda: 0 Note Initiated On: 09/08/2019 9:02 AM Estimated Blood Loss:  Estimated blood loss: none.      [facility]

## 2019-09-08 NOTE — Op Note (Signed)
[facility] Gastroenterology Patient Name: Hannah Shaw Procedure Date: 09/08/2019 9:01 AM MRN: [id number] Account #: [id number] Date of Birth: 1996/04/18 Admit Type: Outpatient Age: 23 Room: [facility] ENDO ROOM 1 Gender: Female Note Status: Finalized Procedure:             Colonoscopy Indications:           Chronic diarrhea Providers:             Robert Bellow, MD Referring MD:          [facility] (Referring MD) Medicines:             Monitored Anesthesia Care Complications:         No immediate complications. Procedure:             Pre-Anesthesia Assessment:                        - Prior to the procedure, a History and Physical was                         performed, and patient medications, allergies and                         sensitivities were reviewed. The patient's tolerance                         of previous anesthesia was reviewed.                        - The risks and benefits of the procedure and the                         sedation options and risks were discussed with the                         patient. All questions were answered and informed                         consent was obtained.                        After obtaining informed consent, the colonoscope was                         passed under direct vision. Throughout the procedure,                         the patient's blood pressure, pulse, and oxygen                         saturations were monitored continuously. The                         Colonoscope was introduced through the anus and                         advanced to the the terminal ileum. The colonoscopy                         was performed without  difficulty. The patient                         tolerated the procedure well. The quality of the bowel                         preparation was excellent. Findings:      A 5 mm polyp was found in the sigmoid colon. The polyp was sessile.       Biopsies  were taken with a cold forceps for histology.      The retroflexed view of the distal rectum and anal verge was normal and       showed no anal or rectal abnormalities. Impression:            - One 5 mm polyp in the sigmoid colon. Biopsied.                        - The distal rectum and anal verge are normal on                         retroflexion view. Recommendation:        - Telephone endoscopist for pathology results in 1                         week. Procedure Code(s):     --- Professional ---                        [phone], Colonoscopy, flexible; with biopsy, single or                         multiple Diagnosis Code(s):     --- Professional ---                        K63.5, Polyp of colon                        K52.9, Noninfective gastroenteritis and colitis,                         unspecified CPT copyright 2019 American Medical Association. All rights reserved. The codes documented in this report are preliminary and upon coder review may  be revised to meet current compliance requirements. Robert Bellow, MD 09/08/2019 9:51:14 AM This report has been signed electronically. Number of Addenda: 0 Note Initiated On: 09/08/2019 9:01 AM Scope Withdrawal Time: 0 hours 16 minutes 39 seconds  Total Procedure Duration: 0 hours 25 minutes 13 seconds  Estimated Blood Loss:  Estimated blood loss: none.      [facility]

## 2019-09-08 NOTE — H&P (Signed)
Hannah Shaw [id number] 1995-12-09      HPI: 23 year old woman with a 6-year history of diarrhea.  Rare formed stools.  No history of blood or mucus.  No associated abdominal pain on questioning today, but reported with diffuse abdominal pain in the EMR.  The patient reports that she has had nausea and vomiting.  She denies any improvement with the use of Zofran or Levsin.  She presents today for upper and lower endoscopy.  Medications Prior to Admission  Medication Sig Dispense Refill Last Dose  . ipratropium (ATROVENT HFA) 17 MCG/ACT inhaler Inhale 2 puffs into the lungs every 6 (six) hours.     Marland Kitchen levothyroxine (SYNTHROID, LEVOTHROID) 25 MCG tablet Take 25 mcg by mouth daily before breakfast.    09/07/2019 at Unknown time  . medroxyPROGESTERone (PROVERA) 10 MG tablet Take 10 mg by mouth daily.   Past Month at Unknown time  . albuterol (VENTOLIN HFA) 108 (90 Base) MCG/ACT inhaler Inhale 2 puffs into the lungs every 4 (four) hours as needed for wheezing or shortness of breath. 18 g 0   . desogestrel-ethinyl estradiol (MIRCETTE) 0.15-0.02/0.01 MG (21/5) tablet Take 1 tablet by mouth at bedtime. (Patient not taking: Reported on 06/26/2019) 1 Package 6   . Norethindrone-Ethinyl Estradiol-Fe Biphas (LO LOESTRIN FE) 1 MG-10 MCG / 10 MCG tablet Take 1 tablet by mouth daily. (Patient not taking: Reported on 06/26/2019) 84 tablet 3   . oxyCODONE-acetaminophen (PERCOCET) 7.5-325 MG tablet Take 1 tablet by mouth every 6 (six) hours as needed for severe pain. (Patient not taking: Reported on 07/13/2019) 30 tablet 0   . prazosin (MINIPRESS) 1 MG capsule Take 1 mg by mouth 2 (two) times daily.      Allergies  Allergen Reactions  . Morphine And Related Hives and Shortness Of Breath  . Fentanyl     PT DENIES THIS ALLERGY DURING PHONE INTERVIEW ON 03-05-16  . Betadine [Povidone Iodine] Rash  . Iodine Rash   Past Medical History:  Diagnosis Date  . Anxiety   . Asthma    WELL CONTROLLED  . Chronic  kidney disease    H/O STONES  . Endometriosis   . GERD (gastroesophageal reflux disease)   . Headache   . History of kidney stones   . History of ovarian cyst   . Hypothyroidism   . Thyroid disease    Past Surgical History:  Procedure Laterality Date  . APPENDECTOMY    . DIAGNOSTIC LAPAROSCOPY    . ENDOMETRIAL BIOPSY    . LAPAROSCOPY N/A 03/13/2016   Procedure: LAPAROSCOPY DIAGNOSTIC;  Surgeon: Honor Loh Ward, MD;  Location: [facility] ORS;  Service: Gynecology;  Laterality: N/A;   Social History   Socioeconomic History  . Marital status: Single    Spouse name: Not on file  . Number of children: 0  . Years of education: Not on file  . Highest education level: Not on file  Occupational History  . Not on file  Social Needs  . Financial resource strain: Not on file  . Food insecurity    Worry: Not on file    Inability: Not on file  . Transportation needs    Medical: Not on file    Non-medical: Not on file  Tobacco Use  . Smoking status: Current Some Day Smoker    Packs/day: 0.50    Types: Cigarettes  . Smokeless tobacco: Never Used  Substance and Sexual Activity  . Alcohol use: No  . Drug use: Never  . Sexual  activity: Yes    Birth control/protection: Pill  Lifestyle  . Physical activity    Days per week: Not on file    Minutes per session: Not on file  . Stress: Not on file  Relationships  . Social Herbalist on phone: Not on file    Gets together: Not on file    Attends religious service: Not on file    Active member of club or organization: Not on file    Attends meetings of clubs or organizations: Not on file    Relationship status: Not on file  . Intimate partner violence    Fear of current or ex partner: Not on file    Emotionally abused: Not on file    Physically abused: Not on file    Forced sexual activity: Not on file  Other Topics Concern  . Not on file  Social History Narrative  . Not on file   Social History   Social History  Narrative  . Not on file     ROS: Negative.     PE: HEENT: Negative. Lungs: Clear. Cardio: RR  Assessment/Plan:  Proceed with planned endoscopy.  Forest Gleason Mariska Daffin 09/08/2019

## 2019-09-11 ENCOUNTER — Encounter: Payer: Self-pay | Admitting: General Surgery

## 2019-09-12 LAB — SURGICAL PATHOLOGY

## 2019-10-12 ENCOUNTER — Other Ambulatory Visit: Payer: Self-pay

## 2019-10-12 ENCOUNTER — Emergency Department
Admission: EM | Admit: 2019-10-12 | Discharge: 2019-10-13 | Disposition: A | Discharge status: Home / Self Care | Payer: Medicare Other | Attending: Emergency Medicine | Admitting: Emergency Medicine

## 2019-10-12 DIAGNOSIS — E039 Hypothyroidism, unspecified: Secondary | ICD-10-CM | POA: Insufficient documentation

## 2019-10-12 DIAGNOSIS — R102 Pelvic and perineal pain: Secondary | ICD-10-CM | POA: Insufficient documentation

## 2019-10-12 DIAGNOSIS — F17210 Nicotine dependence, cigarettes, uncomplicated: Secondary | ICD-10-CM | POA: Diagnosis not present

## 2019-10-12 DIAGNOSIS — Z79899 Other long term (current) drug therapy: Secondary | ICD-10-CM | POA: Diagnosis not present

## 2019-10-12 DIAGNOSIS — G8929 Other chronic pain: Secondary | ICD-10-CM | POA: Insufficient documentation

## 2019-10-12 DIAGNOSIS — J45909 Unspecified asthma, uncomplicated: Secondary | ICD-10-CM | POA: Insufficient documentation

## 2019-10-12 LAB — COMPREHENSIVE METABOLIC PANEL
ALT: 23 U/L (ref 0–44)
AST: 18 U/L (ref 15–41)
Albumin: 4.7 g/dL (ref 3.5–5.0)
Alkaline Phosphatase: 58 U/L (ref 38–126)
Anion gap: 10 (ref 5–15)
BUN: 14 mg/dL (ref 6–20)
CO2: 25 mmol/L (ref 22–32)
Calcium: 9.2 mg/dL (ref 8.9–10.3)
Chloride: 101 mmol/L (ref 98–111)
Creatinine, Ser: 0.65 mg/dL (ref 0.44–1.00)
GFR calc Af Amer: >60 mL/min (ref >60)
GFR calc non Af Amer: >60 mL/min (ref >60)
Glucose, Bld: 101 mg/dL — ABNORMAL HIGH (ref 70–99)
Potassium: 4 mmol/L (ref 3.5–5.1)
Sodium: 136 mmol/L (ref 135–145)
Total Bilirubin: 0.9 mg/dL (ref 0.3–1.2)
Total Protein: 7.9 g/dL (ref 6.5–8.1)

## 2019-10-12 LAB — LIPASE, BLOOD: Lipase: 24 U/L (ref 11–51)

## 2019-10-12 LAB — URINALYSIS, COMPLETE (UACMP) WITH MICROSCOPIC
Bacteria, UA: NONE SEEN
Bilirubin Urine: NEGATIVE
Glucose, UA: NEGATIVE mg/dL
Ketones, ur: NEGATIVE mg/dL
Nitrite: NEGATIVE
Protein, ur: NEGATIVE mg/dL
Specific Gravity, Urine: 1.009 (ref 1.005–1.030)
pH: 8 (ref 5.0–8.0)

## 2019-10-12 LAB — CBC
HCT: 45.2 % (ref 36.0–46.0)
Hemoglobin: 16 g/dL — ABNORMAL HIGH (ref 12.0–15.0)
MCH: 33.8 pg (ref 26.0–34.0)
MCHC: 35.4 g/dL (ref 30.0–36.0)
MCV: 95.4 fL (ref 80.0–100.0)
Platelets: 318 10*3/uL (ref 150–400)
RBC: 4.74 MIL/uL (ref 3.87–5.11)
RDW: 10.9 % — ABNORMAL LOW (ref 11.5–15.5)
WBC: 12.5 10*3/uL — ABNORMAL HIGH (ref 4.0–10.5)
nRBC: 0 % (ref 0.0–0.2)

## 2019-10-12 LAB — POCT PREGNANCY, URINE: Preg Test, Ur: NEGATIVE

## 2019-10-12 MED ORDER — SODIUM CHLORIDE 0.9% FLUSH: 3.0000 mL | Freq: Once | INTRAVENOUS | Status: DC

## 2019-10-12 NOTE — ED Triage Notes (Signed)
Pt to the er for lower abdominal pain over the bladder. Pt denies n/v/d or pain with urination. Pt taking IBU at home for pain.

## 2019-10-13 DIAGNOSIS — R102 Pelvic and perineal pain: Secondary | ICD-10-CM | POA: Diagnosis not present

## 2019-10-13 MED ORDER — DICYCLOMINE HCL 10 MG PO CAPS: 10.0000 mg | ORAL_CAPSULE | Freq: Once | ORAL | Status: DC

## 2019-10-13 MED ORDER — KETOROLAC TROMETHAMINE 30 MG/ML IJ SOLN
30.0000 mg | Freq: Once | INTRAMUSCULAR | Status: AC
  Administered 2019-10-13: 30 mg via INTRAMUSCULAR
  Filled 2019-10-13: qty 1

## 2019-10-13 MED ORDER — DICYCLOMINE HCL 20 MG PO TABS: 20.0000 mg | ORAL_TABLET | Freq: Three times a day (TID) | ORAL | 0 refills | Status: DC | PRN

## 2019-10-13 NOTE — ED Provider Notes (Signed)
[facility] Emergency Department Provider Note   First MD Initiated Contact with Patient 10/12/19 [id number]     (approximate)  I have reviewed the triage vital signs and the nursing notes.   HISTORY  Chief Complaint Abdominal Pain    HPI Hannah Shaw is a 23 y.o. female with below list of previous medical conditions including chronic pelvic pain which patient has been seen multiple times in the emergency department for the same presents to the emergency department stating "pain in the same spot".  Patient states that current pain score is 10 out of 10.  Patient denies any nausea vomiting diarrhea constipation.  Patient denies any fever.  Patient denies any urinary symptoms.        Past Medical History:  Diagnosis Date  . Anxiety   . Asthma    WELL CONTROLLED  . Chronic kidney disease    H/O STONES  . Endometriosis   . GERD (gastroesophageal reflux disease)   . Headache   . History of kidney stones   . History of ovarian cyst   . Hypothyroidism   . Thyroid disease     Patient Active Problem List   Diagnosis Date Noted  . 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 APPENDECTOMY    . COLONOSCOPY WITH PROPOFOL N/A 09/08/2019   Procedure: COLONOSCOPY WITH PROPOFOL;  Surgeon: Robert Bellow, MD;  Location: [facility] ENDOSCOPY;  Service: Endoscopy;  Laterality: N/A;  . DIAGNOSTIC LAPAROSCOPY    . ENDOMETRIAL BIOPSY    . ESOPHAGOGASTRODUODENOSCOPY (EGD) WITH PROPOFOL N/A 09/08/2019   Procedure: ESOPHAGOGASTRODUODENOSCOPY (EGD) WITH PROPOFOL;  Surgeon: Robert Bellow, MD;  Location: [facility] ENDOSCOPY;  Service: Endoscopy;  Laterality: N/A;  . LAPAROSCOPY N/A 03/13/2016   Procedure: LAPAROSCOPY DIAGNOSTIC;  Surgeon: Honor Loh Ward, MD;  Location: [facility] ORS;  Service: Gynecology;  Laterality: N/A;    Prior to Admission medications   Medication Sig Start Date End Date  Taking? Authorizing Provider  albuterol (VENTOLIN HFA) 108 (90 Base) MCG/ACT inhaler Inhale 2 puffs into the lungs every 4 (four) hours as needed for wheezing or shortness of breath. 05/14/19   Paulette Blanch, MD  desogestrel-ethinyl estradiol (MIRCETTE) 0.15-0.02/0.01 MG (21/5) tablet Take 1 tablet by mouth at bedtime. Patient not taking: Reported on 06/26/2019 12/20/18   Jonnie Kind, MD  ipratropium (ATROVENT HFA) 17 MCG/ACT inhaler Inhale 2 puffs into the lungs every 6 (six) hours.    [provider]  levothyroxine (SYNTHROID, LEVOTHROID) 25 MCG tablet Take 25 mcg by mouth daily before breakfast.     [provider]  medroxyPROGESTERone (PROVERA) 10 MG tablet Take 10 mg by mouth daily. 07/07/19   [provider]  Norethindrone-Ethinyl Estradiol-Fe Biphas (LO LOESTRIN FE) 1 MG-10 MCG / 10 MCG tablet Take 1 tablet by mouth daily. Patient not taking: Reported on 06/26/2019 05/17/19   Jonnie Kind, MD  oxyCODONE-acetaminophen (PERCOCET) 7.5-325 MG tablet Take 1 tablet by mouth every 6 (six) hours as needed for severe pain. Patient not taking: Reported on 07/13/2019 01/26/19   Jonnie Kind, MD  prazosin (MINIPRESS) 1 MG capsule Take 1 mg by mouth 2 (two) times daily. 06/08/19   [provider]    Allergies Morphine and related, Fentanyl, Betadine [povidone iodine], and Iodine  Family History  Problem Relation Age of Onset  . Cervical cancer Mother   . Lung cancer Paternal Uncle  Social History Social History   Tobacco Use  . Smoking status: Current Some Day Smoker    Packs/day: 0.50    Types: Cigarettes  . Smokeless tobacco: Never Used  Substance Use Topics  . Alcohol use: No  . Drug use: Never    Review of Systems Constitutional: No fever/chills Eyes: No visual changes. ENT: No sore throat. Cardiovascular: Denies chest pain. Respiratory: Denies shortness of breath. Gastrointestinal: No abdominal pain.  No nausea, no vomiting.  No  diarrhea.  No constipation. Genitourinary: Negative for dysuria. Musculoskeletal: Negative for neck pain.  Negative for back pain. Integumentary: Negative for rash. Neurological: Negative for headaches, focal weakness or numbness.  ____________________________________________   PHYSICAL EXAM:  VITAL SIGNS: ED Triage Vitals [10/12/19 1926]  Enc Vitals Group     BP 110/68     Pulse Rate 92     Resp 18     Temp 98.7 F (37.1 C)     Temp Source Oral     SpO2 100 %     Weight 45.8 kg (101 lb)     Height 1.549 m (5\' 1" )     Head Circumference      Peak Flow      Pain Score 10     Pain Loc      Pain Edu?      Excl. in [place]?     Constitutional: Alert and oriented.  Eyes: Conjunctivae are normal.  Head: Atraumatic. Mouth/Throat: Patient is wearing a mask. Neck: No stridor.  No meningeal signs.   Cardiovascular: Normal rate, regular rhythm. Good peripheral circulation. Grossly normal heart sounds. Respiratory: Normal respiratory effort.  No retractions. Gastrointestinal: Soft and nontender. No distention.  Musculoskeletal: No lower extremity tenderness nor edema. No gross deformities of extremities. Neurologic:  Normal speech and language. No gross focal neurologic deficits are appreciated.  Skin:  Skin is warm, dry and intact. Psychiatric: Mood and affect are normal. Speech and behavior are normal.  ____________________________________________   LABS (all labs ordered are listed, but only abnormal results are displayed)  Labs Reviewed  COMPREHENSIVE METABOLIC PANEL - Abnormal; Notable for the following components:      Result Value   Glucose, Bld 101 (*)    All other components within normal limits  CBC - Abnormal; Notable for the following components:   WBC 12.5 (*)    Hemoglobin 16.0 (*)    RDW 10.9 (*)    All other components within normal limits  URINALYSIS, COMPLETE (UACMP) WITH MICROSCOPIC - Abnormal; Notable for the following components:   Color, Urine STRAW  (*)    APPearance CLEAR (*)    Hgb urine dipstick SMALL (*)    Leukocytes,Ua TRACE (*)    All other components within normal limits  LIPASE, BLOOD  POC URINE PREG, ED  POCT PREGNANCY, URINE     Procedures   ____________________________________________   INITIAL IMPRESSION / MDM / ASSESSMENT AND PLAN / ED COURSE  As part of my medical decision making, I reviewed the following data within the electronic MEDICAL RECORD NUMBER  23 year old female presenting to the emergency department with pelvic discomfort itches is consistent with the patient's known history of chronic pelvic pain.    ____________________________________________  FINAL CLINICAL IMPRESSION(S) / ED DIAGNOSES  Final diagnoses:  Chronic abdominal pain     MEDICATIONS GIVEN DURING THIS VISIT:  Medications  sodium chloride flush (NS) 0.9 % injection 3 mL (has no administration in time range)  ketorolac (TORADOL) 30 MG/ML injection 30 mg (  has no administration in time range)  dicyclomine (BENTYL) capsule 10 mg (has no administration in time range)     ED Discharge Orders    None      *Please note:  Hannah Shaw was evaluated in Emergency Department on 10/13/2019 for the symptoms described in the history of present illness. She was evaluated in the context of the global COVID-19 pandemic, which necessitated consideration that the patient might be at risk for infection with the SARS-CoV-2 virus that causes COVID-19. Institutional protocols and algorithms that pertain to the evaluation of patients at risk for COVID-19 are in a state of rapid change based on information released by regulatory bodies including the CDC and federal and state organizations. These policies and algorithms were followed during the patient's care in the ED.  Some ED evaluations and interventions may be delayed as a result of limited staffing during the pandemic.*  Note:  This document was prepared using Dragon voice recognition software and  may include unintentional dictation errors.   Gregor Hams, MD 10/13/19 [id number]

## 2019-10-29 ENCOUNTER — Encounter: Payer: Self-pay | Admitting: Emergency Medicine

## 2019-10-29 ENCOUNTER — Other Ambulatory Visit: Payer: Self-pay

## 2019-10-29 DIAGNOSIS — J45909 Unspecified asthma, uncomplicated: Secondary | ICD-10-CM | POA: Diagnosis not present

## 2019-10-29 DIAGNOSIS — Z79899 Other long term (current) drug therapy: Secondary | ICD-10-CM | POA: Insufficient documentation

## 2019-10-29 DIAGNOSIS — F17210 Nicotine dependence, cigarettes, uncomplicated: Secondary | ICD-10-CM | POA: Diagnosis not present

## 2019-10-29 DIAGNOSIS — R112 Nausea with vomiting, unspecified: Secondary | ICD-10-CM | POA: Insufficient documentation

## 2019-10-29 DIAGNOSIS — E039 Hypothyroidism, unspecified: Secondary | ICD-10-CM | POA: Insufficient documentation

## 2019-10-29 DIAGNOSIS — G8929 Other chronic pain: Secondary | ICD-10-CM | POA: Insufficient documentation

## 2019-10-29 DIAGNOSIS — R1030 Lower abdominal pain, unspecified: Secondary | ICD-10-CM | POA: Insufficient documentation

## 2019-10-29 LAB — URINALYSIS, COMPLETE (UACMP) WITH MICROSCOPIC
Bilirubin Urine: NEGATIVE
Glucose, UA: NEGATIVE mg/dL
Ketones, ur: NEGATIVE mg/dL
Leukocytes,Ua: NEGATIVE
Nitrite: NEGATIVE
Protein, ur: NEGATIVE mg/dL
Specific Gravity, Urine: 1.01 (ref 1.005–1.030)
pH: 6 (ref 5.0–8.0)

## 2019-10-29 LAB — CBC
HCT: 43.1 % (ref 36.0–46.0)
Hemoglobin: 14.9 g/dL (ref 12.0–15.0)
MCH: 33 pg (ref 26.0–34.0)
MCHC: 34.6 g/dL (ref 30.0–36.0)
MCV: 95.6 fL (ref 80.0–100.0)
Platelets: 238 10*3/uL (ref 150–400)
RBC: 4.51 MIL/uL (ref 3.87–5.11)
RDW: 10.9 % — ABNORMAL LOW (ref 11.5–15.5)
WBC: 8.1 10*3/uL (ref 4.0–10.5)
nRBC: 0 % (ref 0.0–0.2)

## 2019-10-29 LAB — COMPREHENSIVE METABOLIC PANEL
ALT: 23 U/L (ref 0–44)
AST: 21 U/L (ref 15–41)
Albumin: 4.4 g/dL (ref 3.5–5.0)
Alkaline Phosphatase: 42 U/L (ref 38–126)
Anion gap: 10 (ref 5–15)
BUN: 7 mg/dL (ref 6–20)
CO2: 25 mmol/L (ref 22–32)
Calcium: 9.2 mg/dL (ref 8.9–10.3)
Chloride: 103 mmol/L (ref 98–111)
Creatinine, Ser: 0.7 mg/dL (ref 0.44–1.00)
GFR calc Af Amer: >60 mL/min (ref >60)
GFR calc non Af Amer: >60 mL/min (ref >60)
Glucose, Bld: 94 mg/dL (ref 70–99)
Potassium: 3.5 mmol/L (ref 3.5–5.1)
Sodium: 138 mmol/L (ref 135–145)
Total Bilirubin: 1.5 mg/dL — ABNORMAL HIGH (ref 0.3–1.2)
Total Protein: 7.4 g/dL (ref 6.5–8.1)

## 2019-10-29 LAB — LIPASE, BLOOD: Lipase: 22 U/L (ref 11–51)

## 2019-10-29 LAB — POCT PREGNANCY, URINE: Preg Test, Ur: NEGATIVE

## 2019-10-29 NOTE — ED Triage Notes (Signed)
Pt arrived via POV with reports of lower abdominal pain x 3 days with vomiting.  Pt denies any dysuria.  Pt states she is taking ibuprofen and oxycodone for the pain but no relief.

## 2019-10-30 ENCOUNTER — Emergency Department
Admission: EM | Admit: 2019-10-30 | Discharge: 2019-10-30 | Disposition: A | Discharge status: Home / Self Care | Payer: Medicare Other | Attending: Emergency Medicine | Admitting: Emergency Medicine

## 2019-10-30 DIAGNOSIS — R1030 Lower abdominal pain, unspecified: Secondary | ICD-10-CM | POA: Diagnosis not present

## 2019-10-30 DIAGNOSIS — R112 Nausea with vomiting, unspecified: Secondary | ICD-10-CM

## 2019-10-30 DIAGNOSIS — G8929 Other chronic pain: Secondary | ICD-10-CM

## 2019-10-30 MED ORDER — ONDANSETRON 4 MG PO TBDP
4.0000 mg | ORAL_TABLET | Freq: Once | ORAL | Status: AC
  Administered 2019-10-30: 02:00:00 4 mg via ORAL
  Filled 2019-10-30: qty 1

## 2019-10-30 MED ORDER — DICYCLOMINE HCL 10 MG PO CAPS
10.0000 mg | ORAL_CAPSULE | Freq: Once | ORAL | Status: AC
  Administered 2019-10-30: 10 mg via ORAL
  Filled 2019-10-30: qty 1

## 2019-10-30 NOTE — Discharge Instructions (Addendum)
You have been seen in the Emergency Department (ED) for abdominal pain.  Your evaluation did not identify a clear cause of your symptoms but was generally reassuring.  Please follow up as instructed above regarding today's emergent visit and the symptoms that are bothering you.  As we discussed, we cannot provide narcotics for chronic pain according to the [facility] Chronic Pain policy.  Return to the ED if your abdominal pain worsens or fails to improve, you develop bloody vomiting, bloody diarrhea, you are unable to tolerate fluids due to vomiting, fever greater than 101, or other symptoms that concern you.

## 2019-10-30 NOTE — ED Notes (Signed)
ED Provider at bedside with this writer.

## 2019-10-30 NOTE — ED Provider Notes (Signed)
[facility] Emergency Department Provider Note  ____________________________________________   First MD Initiated Contact with Patient 10/30/19 [phone]     (approximate)  I have reviewed the triage vital signs and the nursing notes.   HISTORY  Chief Complaint Abdominal Pain    HPI Hannah Shaw is a 24 y.o. female with a well-documented history of chronic abdominal/pelvic pain  with chronic narcotics and pain management in the past who now sees a family practitioner in [place] for pain management.  She presents tonight for 3 days of lower abdominal pain associated with occasional nausea and vomiting.  She reports the pain is severe and feels the same as prior.  She says that she has a primary care doctor, GI doctor, and OB/GYN, and I was formally a pain management doctor.  She has not seen them recently but she has had some phone calls with the least OB/GYN clinic.  She has not had any dysuria.  She denies fever/chills, sore throat, chest pain, shortness of breath.  Nothing in particular makes her symptoms better or worse.  She says she has been taking her regular medications but is not certain when she last took her Bentyl.  She thinks maybe yesterday.        Past Medical History:  Diagnosis Date  . Anxiety   . Asthma    WELL CONTROLLED  . Chronic kidney disease    H/O STONES  . Endometriosis   . GERD (gastroesophageal reflux disease)   . Headache   . History of kidney stones   . History of ovarian cyst   . Hypothyroidism   . Thyroid disease     Patient Active Problem List   Diagnosis Date Noted  . 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 APPENDECTOMY    . COLONOSCOPY WITH PROPOFOL N/A 09/08/2019   Procedure: COLONOSCOPY WITH PROPOFOL;  Surgeon: Robert Bellow, MD;  Location: [facility] ENDOSCOPY;  Service: Endoscopy;  Laterality: N/A;  . DIAGNOSTIC  LAPAROSCOPY    . ENDOMETRIAL BIOPSY    . ESOPHAGOGASTRODUODENOSCOPY (EGD) WITH PROPOFOL N/A 09/08/2019   Procedure: ESOPHAGOGASTRODUODENOSCOPY (EGD) WITH PROPOFOL;  Surgeon: Robert Bellow, MD;  Location: [facility] ENDOSCOPY;  Service: Endoscopy;  Laterality: N/A;  . LAPAROSCOPY N/A 03/13/2016   Procedure: LAPAROSCOPY DIAGNOSTIC;  Surgeon: Honor Loh Ward, MD;  Location: [facility] ORS;  Service: Gynecology;  Laterality: N/A;    Prior to Admission medications   Medication Sig Start Date End Date Taking? Authorizing Provider  albuterol (VENTOLIN HFA) 108 (90 Base) MCG/ACT inhaler Inhale 2 puffs into the lungs every 4 (four) hours as needed for wheezing or shortness of breath. 05/14/19   Paulette Blanch, MD  desogestrel-ethinyl estradiol (MIRCETTE) 0.15-0.02/0.01 MG (21/5) tablet Take 1 tablet by mouth at bedtime. Patient not taking: Reported on 06/26/2019 12/20/18   Jonnie Kind, MD  dicyclomine (BENTYL) 20 MG tablet Take 1 tablet (20 mg total) by mouth 3 (three) times daily as needed for spasms. 10/13/19 10/12/20  Gregor Hams, MD  ipratropium (ATROVENT HFA) 17 MCG/ACT inhaler Inhale 2 puffs into the lungs every 6 (six) hours.    [provider]  levothyroxine (SYNTHROID, LEVOTHROID) 25 MCG tablet Take 25 mcg by mouth daily before breakfast.     [provider]  medroxyPROGESTERone (PROVERA) 10 MG tablet Take 10 mg by mouth daily. 07/07/19   [provider]  Norethindrone-Ethinyl Estradiol-Fe Biphas (LO LOESTRIN FE)  1 MG-10 MCG / 10 MCG tablet Take 1 tablet by mouth daily. Patient not taking: Reported on 06/26/2019 05/17/19   Jonnie Kind, MD  oxyCODONE-acetaminophen (PERCOCET) 7.5-325 MG tablet Take 1 tablet by mouth every 6 (six) hours as needed for severe pain. Patient not taking: Reported on 07/13/2019 01/26/19   Jonnie Kind, MD  prazosin (MINIPRESS) 1 MG capsule Take 1 mg by mouth 2 (two) times daily. 06/08/19   [provider]    Allergies Morphine and  related, Fentanyl, Betadine [povidone iodine], and Iodine  Family History  Problem Relation Age of Onset  . Cervical cancer Mother   . Lung cancer Paternal Uncle     Social History Social History   Tobacco Use  . Smoking status: Current Some Day Smoker    Packs/day: 0.50    Types: Cigarettes  . Smokeless tobacco: Never Used  Substance Use Topics  . Alcohol use: No  . Drug use: Never    Review of Systems Constitutional: No fever/chills Eyes: No visual changes. ENT: No sore throat. Cardiovascular: Denies chest pain. Respiratory: Denies shortness of breath. Gastrointestinal: Abdominal pain for 3 days similar to chronic abdominal pain associated with nausea and vomiting Genitourinary: Negative for dysuria. Musculoskeletal: Negative for neck pain.  Negative for back pain. Integumentary: Negative for rash. Neurological: Negative for headaches, focal weakness or numbness.   ____________________________________________   PHYSICAL EXAM:  VITAL SIGNS: ED Triage Vitals  Enc Vitals Group     BP 10/29/19 1659 104/77     Pulse Rate 10/29/19 1659 (!) 104     Resp 10/29/19 1659 18     Temp 10/29/19 1659 98.7 F (37.1 C)     Temp Source 10/29/19 1659 Oral     SpO2 10/29/19 1659 99 %     Weight 10/29/19 1653 45.8 kg (101 lb)     Height 10/29/19 1653 1.549 m (5\' 1" )     Head Circumference --      Peak Flow --      Pain Score 10/29/19 1653 10     Pain Loc --      Pain Edu? --      Excl. in [place]? --     Constitutional: Alert and oriented.  No acute distress, ambulatory without difficulty.  Weighted for many hours in the lobby to be evaluated. Eyes: Conjunctivae are normal.  Head: Atraumatic. Nose: No congestion/rhinnorhea. Mouth/Throat: Patient is wearing a mask. Neck: No stridor.  No meningeal signs.   Cardiovascular: Normal rate, regular rhythm. Good peripheral circulation. Grossly normal heart sounds. Respiratory: Normal respiratory effort.  No  retractions. Gastrointestinal: Soft and nondistended.  Mild generalized tenderness to palpation of the lower abdomen but with distraction, the patient indicates no tenderness and is able to continue talking to me and answering questions while I am palpating. Musculoskeletal: No lower extremity tenderness nor edema. No gross deformities of extremities. Neurologic:  Normal speech and language. No gross focal neurologic deficits are appreciated.  Skin:  Skin is warm, dry and intact. Psychiatric: Mood and affect are normal. Speech and behavior are normal.  ____________________________________________   LABS (all labs ordered are listed, but only abnormal results are displayed)  Labs Reviewed  COMPREHENSIVE METABOLIC PANEL - Abnormal; Notable for the following components:      Result Value   Total Bilirubin 1.5 (*)    All other components within normal limits  CBC - Abnormal; Notable for the following components:   RDW 10.9 (*)    All other  components within normal limits  URINALYSIS, COMPLETE (UACMP) WITH MICROSCOPIC - Abnormal; Notable for the following components:   Color, Urine YELLOW (*)    APPearance CLEAR (*)    Hgb urine dipstick MODERATE (*)    Bacteria, UA RARE (*)    All other components within normal limits  LIPASE, BLOOD  POC URINE PREG, ED  POCT PREGNANCY, URINE   ____________________________________________  EKG  No indication for EKG ____________________________________________  RADIOLOGY I, Hinda Kehr, personally viewed and evaluated these images (plain radiographs) as part of my medical decision making, as well as reviewing the written report by the radiologist.  ED MD interpretation: No indication for emergent imaging  Official radiology report(s): No results found.  ____________________________________________   PROCEDURES   Procedure(s) performed (including Critical Care):  Procedures   ____________________________________________   INITIAL  IMPRESSION / MDM / [place] / ED COURSE  As part of my medical decision making, I reviewed the following data within the [place] notes reviewed and incorporated, Labs reviewed , Old chart reviewed, Notes from prior ED visits and [place] Controlled Substance Database   Differential diagnosis includes, but is not limited to, chronic abdominal pain, biliary colic, renal/ureteral colic, STD/PID/TOA.  The patient is well-appearing in no distress with normal vital signs.  She is ambulatory after more than 9 hours in the emergency department and her pain remains constant.  I reviewed the medical records including prior clinic notes and ED visits.  Her presentation is similar to prior and she has had numerous CT scans and ultrasounds in the past.  We talked about the possibility of this being an ovarian cyst similar to prior but that there is no indication of emergency.  She has some hemoglobinuria which she has also had in the past.  There is no evidence of infection in her urine.  On none of the last few scans that I reviewed that she have any evidence of kidney stones and should she have a small stone it should not be clinically relevant and she is already on narcotics, prescribed within the last 2 weeks (120 tablets) by her family medicine doctor in [place].  There is no indication for further treatment at this time.  I provided reassurance to the patient that there is no indication of an emergent medical condition that she needs to follow-up with one of her outpatient providers.  She states that she understands and agrees with the plan.  I am giving her a Bentyl 10 mg capsule and Zofran 4 mg ODT p.o. prior to discharge.  She states that she has all the other medication she needs at home.          ____________________________________________  FINAL CLINICAL IMPRESSION(S) / ED DIAGNOSES  Final diagnoses:  Chronic abdominal pain  Non-intractable vomiting with nausea,  unspecified vomiting type     MEDICATIONS GIVEN DURING THIS VISIT:  Medications  dicyclomine (BENTYL) capsule 10 mg (10 mg Oral Given 10/30/19 0224)  ondansetron (ZOFRAN-ODT) disintegrating tablet 4 mg (4 mg Oral Given 10/30/19 0225)     ED Discharge Orders    None      *Please note:  Hannah Shaw was evaluated in Emergency Department on 10/30/2019 for the symptoms described in the history of present illness. She was evaluated in the context of the global COVID-19 pandemic, which necessitated consideration that the patient might be at risk for infection with the SARS-CoV-2 virus that causes COVID-19. Institutional protocols and algorithms that pertain to  the evaluation of patients at risk for COVID-19 are in a state of rapid change based on information released by regulatory bodies including the CDC and federal and state organizations. These policies and algorithms were followed during the patient's care in the ED.  Some ED evaluations and interventions may be delayed as a result of limited staffing during the pandemic.*  Note:  This document was prepared using Dragon voice recognition software and may include unintentional dictation errors.   Hinda Kehr, MD 10/30/19 Reece Agar

## 2019-12-12 ENCOUNTER — Telehealth: Payer: Self-pay | Admitting: Pain Medicine

## 2019-12-12 NOTE — Telephone Encounter (Signed)
Referring back to your request. We do not offer anything for pelvic pain.  She needs to be referred to a place that deals with pelvic floor issues. Thank you.

## 2019-12-12 NOTE — Telephone Encounter (Signed)
Will have to ask the employee that does referrrals

## 2019-12-12 NOTE — Telephone Encounter (Signed)
Patients PCP would like to know why she was denied for her pain mgmt referral? Please ask Dr. Dossie Arbour for a reason and notify the office. They wanted to speak with a nurse or the doctor

## 2019-12-12 NOTE — Telephone Encounter (Signed)
Message sent to the referring MD.

## 2019-12-13 LAB — GC/CHLAMYDIA PROBE AMP
Chlamydia trachomatis, NAA: NEGATIVE
Neisseria Gonorrhoeae by PCR: NEGATIVE

## 2019-12-13 NOTE — Telephone Encounter (Signed)
She doesn't have pelvic pain she has abdominal pain.  It's almost entirely GI related.  FYI  does that change things?  She has normal reproductive organs.    CW

## 2019-12-14 NOTE — Telephone Encounter (Signed)
Will send request to the MD  for his decision.
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
## 2020-01-28 ENCOUNTER — Emergency Department
Admission: EM | Admit: 2020-01-28 | Discharge: 2020-01-28 | Disposition: A | Discharge status: Home / Self Care | Payer: Medicare Other | Attending: Student | Admitting: Student

## 2020-01-28 ENCOUNTER — Emergency Department: Payer: Medicare Other

## 2020-01-28 ENCOUNTER — Other Ambulatory Visit: Payer: Self-pay

## 2020-01-28 ENCOUNTER — Encounter: Payer: Self-pay | Admitting: Emergency Medicine

## 2020-01-28 DIAGNOSIS — J45909 Unspecified asthma, uncomplicated: Secondary | ICD-10-CM | POA: Insufficient documentation

## 2020-01-28 DIAGNOSIS — Z79899 Other long term (current) drug therapy: Secondary | ICD-10-CM | POA: Diagnosis not present

## 2020-01-28 DIAGNOSIS — R109 Unspecified abdominal pain: Secondary | ICD-10-CM

## 2020-01-28 DIAGNOSIS — R1033 Periumbilical pain: Secondary | ICD-10-CM | POA: Insufficient documentation

## 2020-01-28 DIAGNOSIS — E039 Hypothyroidism, unspecified: Secondary | ICD-10-CM | POA: Insufficient documentation

## 2020-01-28 DIAGNOSIS — N189 Chronic kidney disease, unspecified: Secondary | ICD-10-CM | POA: Diagnosis not present

## 2020-01-28 DIAGNOSIS — F17210 Nicotine dependence, cigarettes, uncomplicated: Secondary | ICD-10-CM | POA: Insufficient documentation

## 2020-01-28 DIAGNOSIS — G8929 Other chronic pain: Secondary | ICD-10-CM | POA: Diagnosis not present

## 2020-01-28 LAB — URINALYSIS, COMPLETE (UACMP) WITH MICROSCOPIC
Bilirubin Urine: NEGATIVE
Glucose, UA: NEGATIVE mg/dL
Ketones, ur: NEGATIVE mg/dL
Nitrite: NEGATIVE
Protein, ur: NEGATIVE mg/dL
Specific Gravity, Urine: 1.004 — ABNORMAL LOW (ref 1.005–1.030)
pH: 8 (ref 5.0–8.0)

## 2020-01-28 LAB — COMPREHENSIVE METABOLIC PANEL
ALT: 17 U/L (ref 0–44)
AST: 19 U/L (ref 15–41)
Albumin: 5 g/dL (ref 3.5–5.0)
Alkaline Phosphatase: 42 U/L (ref 38–126)
Anion gap: 9 (ref 5–15)
BUN: 5 mg/dL — ABNORMAL LOW (ref 6–20)
CO2: 25 mmol/L (ref 22–32)
Calcium: 9 mg/dL (ref 8.9–10.3)
Chloride: 104 mmol/L (ref 98–111)
Creatinine, Ser: 0.68 mg/dL (ref 0.44–1.00)
GFR calc Af Amer: >60 mL/min (ref >60)
GFR calc non Af Amer: >60 mL/min (ref >60)
Glucose, Bld: 101 mg/dL — ABNORMAL HIGH (ref 70–99)
Potassium: 3.1 mmol/L — ABNORMAL LOW (ref 3.5–5.1)
Sodium: 138 mmol/L (ref 135–145)
Total Bilirubin: 1 mg/dL (ref 0.3–1.2)
Total Protein: 8.3 g/dL — ABNORMAL HIGH (ref 6.5–8.1)

## 2020-01-28 LAB — CBC
HCT: 43.1 % (ref 36.0–46.0)
Hemoglobin: 15.1 g/dL — ABNORMAL HIGH (ref 12.0–15.0)
MCH: 32.5 pg (ref 26.0–34.0)
MCHC: 35 g/dL (ref 30.0–36.0)
MCV: 92.9 fL (ref 80.0–100.0)
Platelets: 248 10*3/uL (ref 150–400)
RBC: 4.64 MIL/uL (ref 3.87–5.11)
RDW: 10.8 % — ABNORMAL LOW (ref 11.5–15.5)
WBC: 7.8 10*3/uL (ref 4.0–10.5)
nRBC: 0 % (ref 0.0–0.2)

## 2020-01-28 LAB — LIPASE, BLOOD: Lipase: 26 U/L (ref 11–51)

## 2020-01-28 LAB — POCT PREGNANCY, URINE: Preg Test, Ur: NEGATIVE

## 2020-01-28 MED ORDER — SODIUM CHLORIDE 0.9 % IV BOLUS
500.0000 mL | Freq: Once | INTRAVENOUS | Status: AC
  Administered 2020-01-28: 500 mL via INTRAVENOUS

## 2020-01-28 MED ORDER — KETOROLAC TROMETHAMINE 30 MG/ML IJ SOLN
15.0000 mg | Freq: Once | INTRAMUSCULAR | Status: AC
  Administered 2020-01-28: 15 mg via INTRAVENOUS
  Filled 2020-01-28: qty 1

## 2020-01-28 MED ORDER — SODIUM CHLORIDE 0.9% FLUSH: 3.0000 mL | Freq: Once | INTRAVENOUS | Status: DC

## 2020-01-28 MED ORDER — DICYCLOMINE HCL 20 MG PO TABS: 20.0000 mg | ORAL_TABLET | Freq: Three times a day (TID) | ORAL | 0 refills | Status: DC

## 2020-01-28 MED ORDER — OXYCODONE HCL 5 MG PO TABS
7.5000 mg | ORAL_TABLET | Freq: Once | ORAL | Status: AC
  Administered 2020-01-28: 7.5 mg via ORAL
  Filled 2020-01-28: qty 2

## 2020-01-28 MED ORDER — ONDANSETRON HCL 4 MG/2ML IJ SOLN
4.0000 mg | Freq: Once | INTRAMUSCULAR | Status: AC
  Administered 2020-01-28: 4 mg via INTRAVENOUS
  Filled 2020-01-28: qty 2

## 2020-01-28 NOTE — ED Provider Notes (Signed)
[facility] Emergency Department Provider Note  ____________________________________________   First MD Initiated Contact with Patient 01/28/20 1349     (approximate)  I have reviewed the triage vital signs and the nursing notes.  History  Chief Complaint Abdominal Pain    HPI Hannah Shaw is a 24 y.o. female with hx of chronic abdominal pain (on chronic opiates, followed by GI - on chart review it appears she calls her GI clinic several times weekly) who presents for acute on chronic abdominal pain. Patient states she has had different abdominal pain than her normal for the last several days. She reports a sharp, stabbing pain, just below the umbilicus. Constant. No aggravating components. Improved somewhat with her oxycodone, not improved with her IBS medications. 8/10 in severity. No radiation. Reports baseline hx of n/v that is common w/ her pain, unchanged. Denies new foods or medication. No constipation or diarrhea. No urinary symptoms.    Past Medical Hx Past Medical History:  Diagnosis Date  . Anxiety   . Asthma    WELL CONTROLLED  . Chronic kidney disease    H/O STONES  . Endometriosis   . GERD (gastroesophageal reflux disease)   . Headache   . History of kidney stones   . History of ovarian cyst   . Hypothyroidism   . Thyroid disease     Problem List Patient Active Problem List   Diagnosis Date Noted  . 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 APPENDECTOMY    . COLONOSCOPY WITH PROPOFOL N/A 09/08/2019   Procedure: COLONOSCOPY WITH PROPOFOL;  Surgeon: Robert Bellow, MD;  Location: [facility] ENDOSCOPY;  Service: Endoscopy;  Laterality: N/A;  . DIAGNOSTIC LAPAROSCOPY    . ENDOMETRIAL BIOPSY    . ESOPHAGOGASTRODUODENOSCOPY (EGD) WITH PROPOFOL N/A 09/08/2019   Procedure: ESOPHAGOGASTRODUODENOSCOPY (EGD) WITH PROPOFOL;  Surgeon:  Robert Bellow, MD;  Location: [facility] ENDOSCOPY;  Service: Endoscopy;  Laterality: N/A;  . LAPAROSCOPY N/A 03/13/2016   Procedure: LAPAROSCOPY DIAGNOSTIC;  Surgeon: Honor Loh Ward, MD;  Location: [facility] ORS;  Service: Gynecology;  Laterality: N/A;    Medications Prior to Admission medications   Medication Sig Start Date End Date Taking? Authorizing Provider  albuterol (VENTOLIN HFA) 108 (90 Base) MCG/ACT inhaler Inhale 2 puffs into the lungs every 4 (four) hours as needed for wheezing or shortness of breath. 05/14/19   Paulette Blanch, MD  desogestrel-ethinyl estradiol (MIRCETTE) 0.15-0.02/0.01 MG (21/5) tablet Take 1 tablet by mouth at bedtime. Patient not taking: Reported on 06/26/2019 12/20/18   Jonnie Kind, MD  dicyclomine (BENTYL) 20 MG tablet Take 1 tablet (20 mg total) by mouth 3 (three) times daily before meals for 7 days. 01/28/20 02/04/20  Lilia Pro., MD  ipratropium (ATROVENT HFA) 17 MCG/ACT inhaler Inhale 2 puffs into the lungs every 6 (six) hours.    [provider]  levothyroxine (SYNTHROID, LEVOTHROID) 25 MCG tablet Take 25 mcg by mouth daily before breakfast.     [provider]  medroxyPROGESTERone (PROVERA) 10 MG tablet Take 10 mg by mouth daily. 07/07/19   [provider]  Norethindrone-Ethinyl Estradiol-Fe Biphas (LO LOESTRIN FE) 1 MG-10 MCG / 10 MCG tablet Take 1 tablet by mouth daily. Patient not taking: Reported on 06/26/2019 05/17/19   Jonnie Kind, MD  oxyCODONE-acetaminophen (PERCOCET) 7.5-325 MG tablet Take 1 tablet by mouth every 6 (six) hours as needed for  severe pain. Patient not taking: Reported on 07/13/2019 01/26/19   Jonnie Kind, MD  prazosin (MINIPRESS) 1 MG capsule Take 1 mg by mouth 2 (two) times daily. 06/08/19   [provider]    Allergies Morphine and related, Fentanyl, Sertraline, Betadine [povidone iodine], and Iodine  Family Hx Family History  Problem Relation Age of Onset  . Cervical cancer Mother   . Lung  cancer Paternal Uncle     Social Hx Social History   Tobacco Use  . Smoking status: Current Some Day Smoker    Packs/day: 0.50    Types: Cigarettes  . Smokeless tobacco: Never Used  Substance Use Topics  . Alcohol use: No  . Drug use: Never     Review of Systems  Constitutional: Negative for fever. Negative for chills. Eyes: Negative for visual changes. ENT: Negative for sore throat. Cardiovascular: Negative for chest pain. Respiratory: Negative for shortness of breath. Gastrointestinal: Negative for nausea. + for vomiting. + acute on chronic abdominal pain Genitourinary: Negative for dysuria. Musculoskeletal: Negative for leg swelling. Skin: Negative for rash. Neurological: Negative for headaches.   Physical Exam  Vital Signs: ED Triage Vitals  Enc Vitals Group     BP 01/28/20 1303 125/87     Pulse Rate 01/28/20 1303 (!) 113     Resp 01/28/20 1303 18     Temp 01/28/20 1303 99.2 F (37.3 C)     Temp src --      SpO2 01/28/20 1303 100 %     Weight 01/28/20 1304 111 lb (50.3 kg)     Height 01/28/20 1304 5\' 2"  (1.575 m)     Head Circumference --      Peak Flow --      Pain Score 01/28/20 1304 10     Pain Loc --      Pain Edu? --      Excl. in [place]? --     Constitutional: Alert and oriented. Well appearing. NAD.  Head: Normocephalic. Atraumatic. Eyes: Conjunctivae clear. Sclera anicteric. Pupils equal and symmetric. Nose: No masses or lesions. No congestion or rhinorrhea. Mouth/Throat: Wearing mask.  Neck: No stridor. Trachea midline.  Cardiovascular: Tachycardic on arrival, HR 90-100s on my exam, regular rhythm. Extremities well perfused. Respiratory: Normal respiratory effort.  Lungs CTAB. Gastrointestinal: Soft. Non-distended. Non-tender thoughout. No rebound or guarding.  Genitourinary: Deferred. Musculoskeletal: No lower extremity edema. No deformities. Neurologic:  Normal speech and language. No gross focal or lateralizing neurologic deficits are  appreciated.  Skin: Skin is warm, dry and intact. No rash noted. Psychiatric: Mood and affect are appropriate for situation.  EKG  N/A    Radiology  Personally reviewed available imaging myself.   CT  IMPRESSION:  1. There are scattered subcentimeter mesenteric lymph nodes which do  not meet size criteria for pathologic significance. These lymph  nodes are considered nonspecific by imaging. In the appropriate  clinical setting, these lymph nodes potentially could be indicative  of a degree of mesenteric adenitis.  2. No appreciable bowel wall thickening or bowel obstruction. No  abscess in the abdomen pelvis. Appendix absent. No periappendiceal  region inflammation.  3. No evident renal or ureteral calculus. No hydronephrosis. Urinary  bladder wall thickness within normal limits.    Procedures  Procedure(s) performed (including critical care):  Procedures   Initial Impression / Assessment and Plan / MDM / ED Course  24 y.o. female who presents to the ED for acute on chronic abdominal pain  Ddx: acute on  chronic known abdominal pain, UTI, stone, colitis, IBS, abdominal crampings. She is s/p appendectomy.  Will plan for labs, symptom control. On chart review she has not had CT imaging in about 6 month. Given this is a change from her normal pain, feel it is reasonable to repeat today. She is agreeable.  CT could be indicative of a degree of mesenteric adenitis, otherwise unremarkable. Labs w/o actionable derangments. Given normal labs, stable VS (HR improved on recheck), feel she is appropriate for d/c with supportive care and outpatient follow up. Patient is agreeable with plan.    _______________________________   As part of my medical decision making I have reviewed available labs, radiology tests, reviewed old records/chart review.   Final Clinical Impression(s) / ED Diagnosis  Final diagnoses:  Chronic abdominal pain  Central abdominal pain       Note:   This document was prepared using Dragon voice recognition software and may include unintentional dictation errors.   Lilia Pro., MD 01/28/20 2137

## 2020-01-28 NOTE — ED Notes (Signed)
Pt provided phone to use.

## 2020-01-28 NOTE — ED Triage Notes (Signed)
FIRST NURSE NOTE:  Here for abdominal pain, has hx of abd pain, no distress noted on arrival.   Denies any N/V/D

## 2020-01-28 NOTE — Discharge Instructions (Signed)
Thank you for letting us take care of you in the emergency department today.   Please continue to take any regular, prescribed medications.   New medications we have prescribed:  Bentyl, as needed for abdominal cramping  Please follow up with: Your primary care doctor to review your ER visit and follow up on your symptoms.    Please return to the ER for any new or worsening symptoms.

## 2020-01-28 NOTE — ED Notes (Signed)
Pt ambulatory to toilet to urinate. Awaiting [place] IV placement.

## 2020-01-28 NOTE — ED Notes (Signed)
Pt taken to CT via stretcher.

## 2020-01-28 NOTE — ED Triage Notes (Signed)
Pt to ER with c/o LLQ abdominal pain since Thursday.  Pt states n/v, denies diarrhea.

## 2020-01-28 NOTE — ED Notes (Signed)
Attempted to start IV to provide pt medications. Pt veins extremely small. Asked Psychologist, occupational to look with [place] machine.

## 2020-02-05 DIAGNOSIS — Z79899 Other long term (current) drug therapy: Secondary | ICD-10-CM | POA: Insufficient documentation

## 2020-02-05 DIAGNOSIS — J45909 Unspecified asthma, uncomplicated: Secondary | ICD-10-CM | POA: Diagnosis not present

## 2020-02-05 DIAGNOSIS — W5501XA Bitten by cat, initial encounter: Secondary | ICD-10-CM | POA: Diagnosis not present

## 2020-02-05 DIAGNOSIS — S41141A Puncture wound with foreign body of right upper arm, initial encounter: Secondary | ICD-10-CM | POA: Diagnosis present

## 2020-02-05 DIAGNOSIS — E039 Hypothyroidism, unspecified: Secondary | ICD-10-CM | POA: Diagnosis not present

## 2020-02-05 DIAGNOSIS — Y939 Activity, unspecified: Secondary | ICD-10-CM | POA: Diagnosis not present

## 2020-02-05 DIAGNOSIS — F17210 Nicotine dependence, cigarettes, uncomplicated: Secondary | ICD-10-CM | POA: Insufficient documentation

## 2020-02-05 DIAGNOSIS — Y929 Unspecified place or not applicable: Secondary | ICD-10-CM | POA: Diagnosis not present

## 2020-02-05 DIAGNOSIS — Y999 Unspecified external cause status: Secondary | ICD-10-CM | POA: Diagnosis not present

## 2020-02-06 ENCOUNTER — Other Ambulatory Visit: Payer: Self-pay

## 2020-02-06 ENCOUNTER — Encounter: Payer: Self-pay | Admitting: Emergency Medicine

## 2020-02-06 ENCOUNTER — Emergency Department
Admission: EM | Admit: 2020-02-06 | Discharge: 2020-02-06 | Disposition: A | Discharge status: Home / Self Care | Payer: Medicare Other | Attending: Emergency Medicine | Admitting: Emergency Medicine

## 2020-02-06 DIAGNOSIS — W5501XA Bitten by cat, initial encounter: Secondary | ICD-10-CM

## 2020-02-06 DIAGNOSIS — S41141A Puncture wound with foreign body of right upper arm, initial encounter: Secondary | ICD-10-CM | POA: Diagnosis not present

## 2020-02-06 LAB — CBC WITH DIFFERENTIAL/PLATELET
Abs Immature Granulocytes: 0.04 10*3/uL (ref 0.00–0.07)
Basophils Absolute: 0.1 10*3/uL (ref 0.0–0.1)
Basophils Relative: 0 %
Eosinophils Absolute: 0.1 10*3/uL (ref 0.0–0.5)
Eosinophils Relative: 1 %
HCT: 41.3 % (ref 36.0–46.0)
Hemoglobin: 14.5 g/dL (ref 12.0–15.0)
Immature Granulocytes: 0 %
Lymphocytes Relative: 27 %
Lymphs Abs: 3.1 10*3/uL (ref 0.7–4.0)
MCH: 32.7 pg (ref 26.0–34.0)
MCHC: 35.1 g/dL (ref 30.0–36.0)
MCV: 93.2 fL (ref 80.0–100.0)
Monocytes Absolute: 0.6 10*3/uL (ref 0.1–1.0)
Monocytes Relative: 5 %
Neutro Abs: 7.5 10*3/uL (ref 1.7–7.7)
Neutrophils Relative %: 67 %
Platelets: 238 10*3/uL (ref 150–400)
RBC: 4.43 MIL/uL (ref 3.87–5.11)
RDW: 11.3 % — ABNORMAL LOW (ref 11.5–15.5)
WBC: 11.3 10*3/uL — ABNORMAL HIGH (ref 4.0–10.5)
nRBC: 0 % (ref 0.0–0.2)

## 2020-02-06 LAB — COMPREHENSIVE METABOLIC PANEL
ALT: 15 U/L (ref 0–44)
AST: 16 U/L (ref 15–41)
Albumin: 4.5 g/dL (ref 3.5–5.0)
Alkaline Phosphatase: 36 U/L — ABNORMAL LOW (ref 38–126)
Anion gap: 10 (ref 5–15)
BUN: 6 mg/dL (ref 6–20)
CO2: 19 mmol/L — ABNORMAL LOW (ref 22–32)
Calcium: 9 mg/dL (ref 8.9–10.3)
Chloride: 107 mmol/L (ref 98–111)
Creatinine, Ser: 0.57 mg/dL (ref 0.44–1.00)
GFR calc Af Amer: >60 mL/min (ref >60)
GFR calc non Af Amer: >60 mL/min (ref >60)
Glucose, Bld: 99 mg/dL (ref 70–99)
Potassium: 3.4 mmol/L — ABNORMAL LOW (ref 3.5–5.1)
Sodium: 136 mmol/L (ref 135–145)
Total Bilirubin: 1 mg/dL (ref 0.3–1.2)
Total Protein: 8 g/dL (ref 6.5–8.1)

## 2020-02-06 LAB — POCT PREGNANCY, URINE: Preg Test, Ur: NEGATIVE

## 2020-02-06 MED ORDER — NAPROXEN 500 MG PO TABS
500.0000 mg | ORAL_TABLET | Freq: Once | ORAL | Status: AC
  Administered 2020-02-06: 500 mg via ORAL
  Filled 2020-02-06: qty 1

## 2020-02-06 MED ORDER — OXYCODONE-ACETAMINOPHEN 5-325 MG PO TABS: 1.0000 | ORAL_TABLET | ORAL | 0 refills | Status: AC | PRN

## 2020-02-06 MED ORDER — AMOXICILLIN-POT CLAVULANATE 875-125 MG PO TABS: 1.0000 | ORAL_TABLET | Freq: Two times a day (BID) | ORAL | 0 refills | Status: AC

## 2020-02-06 MED ORDER — AMOXICILLIN-POT CLAVULANATE 875-125 MG PO TABS
1.0000 | ORAL_TABLET | Freq: Once | ORAL | Status: DC
  Filled 2020-02-06: qty 1

## 2020-02-06 MED ORDER — AMOXICILLIN-POT CLAVULANATE 875-125 MG PO TABS
1.0000 | ORAL_TABLET | Freq: Once | ORAL | Status: AC
  Administered 2020-02-06: 1 via ORAL

## 2020-02-06 MED ORDER — OXYCODONE-ACETAMINOPHEN 5-325 MG PO TABS
1.0000 | ORAL_TABLET | Freq: Once | ORAL | Status: AC
  Administered 2020-02-06: 1 via ORAL
  Filled 2020-02-06: qty 1

## 2020-02-06 MED ORDER — NAPROXEN 500 MG PO TABS: 500.0000 mg | ORAL_TABLET | Freq: Two times a day (BID) | ORAL | Status: DC

## 2020-02-06 NOTE — Discharge Instructions (Addendum)
Follow discharge care instruction take medication as directed. °

## 2020-02-06 NOTE — ED Notes (Signed)
See triage note  States she was bitten by a stray cat   Left lower arm is red and has some green drainage  No fever  Some warmth with touch

## 2020-02-06 NOTE — ED Triage Notes (Addendum)
Pt presents to ED with cat bite ot right lower arm. Pt states she was bitten by a stray cat last night and today it is very red and had green drainage. 3 puncture wounds noted; 2 to top of arm and one on forearm. Redness swelling and warmth noted to affected area. Marked with pen.

## 2020-02-06 NOTE — ED Provider Notes (Signed)
[facility] Emergency Department Provider Note   ____________________________________________   First MD Initiated Contact with Patient 02/06/20 0730     (approximate)  I have reviewed the triage vital signs and the nursing notes.   HISTORY  Chief Complaint Animal Bite    HPI Hannah Shaw is a 24 y.o. female patient presents with cat bite to the right arm.  Patient states they have been last night.  Patient states areas return very red with mild drainage.  Patient denies fever chills associated with complaint.  Patient states tetanus shot is up-to-date.         Past Medical History:  Diagnosis Date  . Anxiety   . Asthma    WELL CONTROLLED  . Chronic kidney disease    H/O STONES  . Endometriosis   . GERD (gastroesophageal reflux disease)   . Headache   . History of kidney stones   . History of ovarian cyst   . Hypothyroidism   . Thyroid disease     Patient Active Problem List   Diagnosis Date Noted  . 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 APPENDECTOMY    . COLONOSCOPY WITH PROPOFOL N/A 09/08/2019   Procedure: COLONOSCOPY WITH PROPOFOL;  Surgeon: Robert Bellow, MD;  Location: [facility] ENDOSCOPY;  Service: Endoscopy;  Laterality: N/A;  . DIAGNOSTIC LAPAROSCOPY    . ENDOMETRIAL BIOPSY    . ESOPHAGOGASTRODUODENOSCOPY (EGD) WITH PROPOFOL N/A 09/08/2019   Procedure: ESOPHAGOGASTRODUODENOSCOPY (EGD) WITH PROPOFOL;  Surgeon: Robert Bellow, MD;  Location: [facility] ENDOSCOPY;  Service: Endoscopy;  Laterality: N/A;  . LAPAROSCOPY N/A 03/13/2016   Procedure: LAPAROSCOPY DIAGNOSTIC;  Surgeon: Honor Loh Ward, MD;  Location: [facility] ORS;  Service: Gynecology;  Laterality: N/A;    Prior to Admission medications   Medication Sig Start Date End Date Taking? Authorizing Provider  albuterol (VENTOLIN HFA) 108 (90 Base) MCG/ACT inhaler Inhale 2 puffs into the  lungs every 4 (four) hours as needed for wheezing or shortness of breath. 05/14/19   Paulette Blanch, MD  dicyclomine (BENTYL) 20 MG tablet Take 1 tablet (20 mg total) by mouth 3 (three) times daily before meals for 7 days. 01/28/20 02/04/20  Lilia Pro., MD  ipratropium (ATROVENT HFA) 17 MCG/ACT inhaler Inhale 2 puffs into the lungs every 6 (six) hours.    [provider]  levothyroxine (SYNTHROID, LEVOTHROID) 25 MCG tablet Take 25 mcg by mouth daily before breakfast.     [provider]  medroxyPROGESTERone (PROVERA) 10 MG tablet Take 10 mg by mouth daily. 07/07/19   [provider]  naproxen (NAPROSYN) 500 MG tablet Take 1 tablet (500 mg total) by mouth 2 (two) times daily with a meal. 02/06/20   Sable Feil, PA-C  oxyCODONE-acetaminophen (PERCOCET) 5-325 MG tablet Take 1 tablet by mouth every 4 (four) hours as needed for up to 5 days for severe pain. 02/06/20 02/11/20  Sable Feil, PA-C  prazosin (MINIPRESS) 1 MG capsule Take 1 mg by mouth 2 (two) times daily. 06/08/19   [provider]    Allergies Morphine and related, Fentanyl, Sertraline, Betadine [povidone iodine], and Iodine  Family History  Problem Relation Age of Onset  . Cervical cancer Mother   . Lung cancer Paternal Uncle     Social History Social History   Tobacco Use  . Smoking status: Current Every Day Smoker    Packs/day: 0.50  Types: Cigarettes  . Smokeless tobacco: Never Used  Substance Use Topics  . Alcohol use: No  . Drug use: Never    Review of Systems Constitutional: No fever/chills Eyes: No visual changes. ENT: No sore throat. Cardiovascular: Denies chest pain. Respiratory: Denies shortness of breath. Gastrointestinal: No abdominal pain.  No nausea, no vomiting.  No diarrhea.  No constipation. Genitourinary: Negative for dysuria. Musculoskeletal: Negative for back pain. Skin: Negative for rash. Neurological: Negative for headaches, focal weakness or  numbness. Psychiatric: Anxiety.  Endocrine:  Hypothyroidism. Hematological/Lymphatic:  Allergic/Immunilogical: Morphine, fentanyl, Betadine, and iodine. ____________________________________________   PHYSICAL EXAM:  VITAL SIGNS: ED Triage Vitals  Enc Vitals Group     BP 02/05/20 2356 129/77     Pulse Rate 02/05/20 2356 (!) 111     Resp 02/05/20 2356 18     Temp 02/05/20 2356 98.7 F (37.1 C)     Temp Source 02/05/20 2356 Oral     SpO2 02/05/20 2356 100 %     Weight 02/05/20 2356 111 lb (50.3 kg)     Height 02/05/20 2356 5\' 2"  (1.575 m)     Head Circumference --      Peak Flow --      Pain Score 02/06/20 0001 10     Pain Loc --      Pain Edu? --      Excl. in [place]? --    Constitutional: Alert and oriented. Well appearing and in no acute distress. Cardiovascular: Tachycardic, regular rhythm. Grossly normal heart sounds.  Good peripheral circulation. Respiratory: Normal respiratory effort.  No retractions. Lungs CTAB. Neurologic:  Normal speech and language. No gross focal neurologic deficits are appreciated. No gait instability. Skin: 2 puncture wounds on erythematous base right forearm.Marland Kitchen Psychiatric: Mood and affect are normal. Speech and behavior are normal.  ____________________________________________   LABS (all labs ordered are listed, but only abnormal results are displayed)  Labs Reviewed  CBC WITH DIFFERENTIAL/PLATELET - Abnormal; Notable for the following components:      Result Value   WBC 11.3 (*)    RDW 11.3 (*)    All other components within normal limits  COMPREHENSIVE METABOLIC PANEL - Abnormal; Notable for the following components:   Potassium 3.4 (*)    CO2 19 (*)    Alkaline Phosphatase 36 (*)    All other components within normal limits  CULTURE, BLOOD (ROUTINE X 2)  CULTURE, BLOOD (ROUTINE X 2)  LACTIC ACID, PLASMA  LACTIC ACID, PLASMA  POC URINE PREG, ED  POCT PREGNANCY, URINE    ____________________________________________  EKG   ____________________________________________  RADIOLOGY  ED MD interpretation:    Official radiology report(s): No results found.  ____________________________________________   PROCEDURES  Procedure(s) performed (including Critical Care):  Procedures   ____________________________________________   INITIAL IMPRESSION / ASSESSMENT AND PLAN / ED COURSE  As part of my medical decision making, I reviewed the following data within the [place]     Patient presents with pain and redness secondary to cat bite to the right forearm.  Patient given discharge care instructions and advised take medication as directed.  Patient advised follow-up PCP if no improvement in 3 to 5 days.  Return to ED if condition worsens.    Hannah Shaw was evaluated in Emergency Department on 02/06/2020 for the symptoms described in the history of present illness. She was evaluated in the context of the global COVID-19 pandemic, which necessitated consideration that the patient might be at risk for  infection with the SARS-CoV-2 virus that causes COVID-19. Institutional protocols and algorithms that pertain to the evaluation of patients at risk for COVID-19 are in a state of rapid change based on information released by regulatory bodies including the CDC and federal and state organizations. These policies and algorithms were followed during the patient's care in the ED.       ____________________________________________   FINAL CLINICAL IMPRESSION(S) / ED DIAGNOSES  Final diagnoses:  Cat bite, initial encounter     ED Discharge Orders         Ordered    oxyCODONE-acetaminophen (PERCOCET) 5-325 MG tablet  Every 4 hours PRN     02/06/20 0738    naproxen (NAPROSYN) 500 MG tablet  2 times daily with meals     02/06/20 0738           Note:  This document was prepared using Dragon voice recognition software and may  include unintentional dictation errors.    Sable Feil, PA-C 02/06/20 0745    Duffy Bruce, MD 02/12/20 2143

## 2020-02-06 NOTE — ED Notes (Signed)
Redness and swelling to wounds does not appear to have changed since initial triage.  Pt resting quietly with eyes closed.

## 2020-02-25 ENCOUNTER — Encounter (HOSPITAL_COMMUNITY): Payer: Self-pay | Admitting: *Deleted

## 2020-02-25 ENCOUNTER — Emergency Department (HOSPITAL_COMMUNITY)
Admission: EM | Admit: 2020-02-25 | Discharge: 2020-02-25 | Disposition: A | Discharge status: Home / Self Care | Payer: Medicare Other | Attending: Emergency Medicine | Admitting: Emergency Medicine

## 2020-02-25 ENCOUNTER — Other Ambulatory Visit: Payer: Self-pay

## 2020-02-25 DIAGNOSIS — Z5321 Procedure and treatment not carried out due to patient leaving prior to being seen by health care provider: Secondary | ICD-10-CM | POA: Insufficient documentation

## 2020-02-25 DIAGNOSIS — M79645 Pain in left finger(s): Secondary | ICD-10-CM | POA: Diagnosis present

## 2020-02-25 NOTE — ED Triage Notes (Signed)
Pt states a spider bit her on tip of left middle finger, no redness or marks noted.  Pt has a spider in a small glass jar and has brought it with her.  Occurred last night per pt.

## 2020-02-26 ENCOUNTER — Emergency Department
Admission: EM | Admit: 2020-02-26 | Discharge: 2020-02-26 | Disposition: A | Discharge status: Home / Self Care | Payer: Medicare Other | Attending: Emergency Medicine | Admitting: Emergency Medicine

## 2020-02-26 ENCOUNTER — Encounter: Payer: Self-pay | Admitting: Emergency Medicine

## 2020-02-26 ENCOUNTER — Emergency Department: Payer: Medicare Other

## 2020-02-26 DIAGNOSIS — R0781 Pleurodynia: Secondary | ICD-10-CM

## 2020-02-26 DIAGNOSIS — J45909 Unspecified asthma, uncomplicated: Secondary | ICD-10-CM | POA: Diagnosis not present

## 2020-02-26 DIAGNOSIS — F17210 Nicotine dependence, cigarettes, uncomplicated: Secondary | ICD-10-CM | POA: Insufficient documentation

## 2020-02-26 DIAGNOSIS — R0789 Other chest pain: Secondary | ICD-10-CM | POA: Diagnosis not present

## 2020-02-26 DIAGNOSIS — T63301A Toxic effect of unspecified spider venom, accidental (unintentional), initial encounter: Secondary | ICD-10-CM | POA: Diagnosis present

## 2020-02-26 DIAGNOSIS — E039 Hypothyroidism, unspecified: Secondary | ICD-10-CM | POA: Insufficient documentation

## 2020-02-26 DIAGNOSIS — Z79899 Other long term (current) drug therapy: Secondary | ICD-10-CM | POA: Insufficient documentation

## 2020-02-26 MED ORDER — PREDNISONE 10 MG (21) PO TBPK: ORAL_TABLET | ORAL | 0 refills | Status: DC

## 2020-02-26 NOTE — ED Triage Notes (Signed)
Pt reports she was bit by spider on the left middle finger x2 days ago. Pt has spider with her in container. No swelling or redness noted to area.

## 2020-02-26 NOTE — Discharge Instructions (Addendum)
Follow-up with your regular doctor as needed.  Return emergency department worsening.  Use medication as prescribed

## 2020-02-26 NOTE — ED Provider Notes (Signed)
[facility] Emergency Department Provider Note  ____________________________________________   First MD Initiated Contact with Patient 02/26/20 1944     (approximate)  I have reviewed the triage vital signs and the nursing notes.   HISTORY  Chief Complaint Insect Bite    HPI Hannah Shaw is a 24 y.o. female presents emergency department stating she was bit by a spider the other day, no redness or swelling at the area of the spider bite, now she is complaining of left anterior rib pain/chest pain.  States it is sometimes worse after she eats sometimes worse with movement.  No concern consistent precursor to pain.  Patient needs to see a lung specialist.  Patient has a history of asthma hypothyroidism chronic kidney disease anxiety and ovarian cyst.    Past Medical History:  Diagnosis Date  . Anxiety   . Asthma    WELL CONTROLLED  . Chronic kidney disease    H/O STONES  . Endometriosis   . GERD (gastroesophageal reflux disease)   . Headache   . History of kidney stones   . History of ovarian cyst   . Hypothyroidism   . Thyroid disease     Patient Active Problem List   Diagnosis Date Noted  . 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 APPENDECTOMY    . COLONOSCOPY WITH PROPOFOL N/A 09/08/2019   Procedure: COLONOSCOPY WITH PROPOFOL;  Surgeon: Robert Bellow, MD;  Location: [facility] ENDOSCOPY;  Service: Endoscopy;  Laterality: N/A;  . DIAGNOSTIC LAPAROSCOPY    . ENDOMETRIAL BIOPSY    . ESOPHAGOGASTRODUODENOSCOPY (EGD) WITH PROPOFOL N/A 09/08/2019   Procedure: ESOPHAGOGASTRODUODENOSCOPY (EGD) WITH PROPOFOL;  Surgeon: Robert Bellow, MD;  Location: [facility] ENDOSCOPY;  Service: Endoscopy;  Laterality: N/A;  . LAPAROSCOPY N/A 03/13/2016   Procedure: LAPAROSCOPY DIAGNOSTIC;  Surgeon: Honor Loh Ward, MD;  Location: [facility] ORS;  Service: Gynecology;  Laterality: N/A;     Prior to Admission medications   Medication Sig Start Date End Date Taking? Authorizing Provider  albuterol (VENTOLIN HFA) 108 (90 Base) MCG/ACT inhaler Inhale 2 puffs into the lungs every 4 (four) hours as needed for wheezing or shortness of breath. 05/14/19   Paulette Blanch, MD  dicyclomine (BENTYL) 20 MG tablet Take 1 tablet (20 mg total) by mouth 3 (three) times daily before meals for 7 days. 01/28/20 02/04/20  Lilia Pro., MD  ipratropium (ATROVENT HFA) 17 MCG/ACT inhaler Inhale 2 puffs into the lungs every 6 (six) hours.    [provider]  levothyroxine (SYNTHROID, LEVOTHROID) 25 MCG tablet Take 25 mcg by mouth daily before breakfast.     [provider]  medroxyPROGESTERone (PROVERA) 10 MG tablet Take 10 mg by mouth daily. 07/07/19   [provider]  naproxen (NAPROSYN) 500 MG tablet Take 1 tablet (500 mg total) by mouth 2 (two) times daily with a meal. 02/06/20   Sable Feil, PA-C  prazosin (MINIPRESS) 1 MG capsule Take 1 mg by mouth 2 (two) times daily. 06/08/19   [provider]  predniSONE (STERAPRED UNI-PAK 21 TAB) 10 MG (21) TBPK tablet Take 6 pills on day one then decrease by 1 pill each day 02/26/20   Versie Starks, PA-C    Allergies Morphine and related, Fentanyl, Sertraline, Betadine [povidone iodine], and Iodine  Family History  Problem Relation Age of Onset  . Cervical cancer Mother   . Lung cancer Paternal  Uncle     Social History Social History   Tobacco Use  . Smoking status: Current Every Day Smoker    Packs/day: 0.50    Types: Cigarettes  . Smokeless tobacco: Never Used  Substance Use Topics  . Alcohol use: No  . Drug use: Never    Review of Systems  Constitutional: No fever/chills Eyes: No visual changes. ENT: No sore throat. Respiratory: Denies cough, positive for left rib pain Cardiovascular: Denies chest pain Gastrointestinal: Denies abdominal pain Genitourinary: Negative for dysuria. Musculoskeletal:  Negative for back pain. Skin: Negative for rash. Psychiatric: no mood changes,     ____________________________________________   PHYSICAL EXAM:  VITAL SIGNS: ED Triage Vitals [02/26/20 1916]  Enc Vitals Group     BP 118/85     Pulse Rate 100     Resp 18     Temp 99.2 F (37.3 C)     Temp Source Oral     SpO2 100 %     Weight 103 lb (46.7 kg)     Height 5\' 2"  (1.575 m)     Head Circumference      Peak Flow      Pain Score      Pain Loc      Pain Edu?      Excl. in [place]?     Constitutional: Alert and oriented. Well appearing and in no acute distress. Eyes: Conjunctivae are normal.  Head: Atraumatic. Nose: No congestion/rhinnorhea. Mouth/Throat: Mucous membranes are moist.   Neck:  supple no lymphadenopathy noted Cardiovascular: Normal rate, regular rhythm. Heart sounds are normal Respiratory: Normal respiratory effort.  No retractions, lungs c t a, left ribs are tender at the anterior aspect Abd: soft nontender bs normal all 4 quad, no hepatosplenomegaly noted GU: deferred Musculoskeletal: FROM all extremities, warm and well perfused Neurologic:  Normal speech and language.  Skin:  Skin is warm, dry and intact. No rash noted.  No evidence of spider bite Psychiatric: Mood and affect are normal. Speech and behavior are normal.  ____________________________________________   LABS (all labs ordered are listed, but only abnormal results are displayed)  Labs Reviewed - No data to display ____________________________________________   ____________________________________________  RADIOLOGY  Chest x-ray is normal  ____________________________________________   PROCEDURES  Procedure(s) performed: No  Procedures    ____________________________________________   INITIAL IMPRESSION / ASSESSMENT AND PLAN / ED COURSE  Pertinent labs & imaging results that were available during my care of the patient were reviewed by me and considered in my medical decision  making (see chart for details).   Patient is 24 year old female presents emergency department with complaints of a spider bite and left rib pain.  See HPI  Physical exam patient appears well.  Anterior left ribs are tender.  Remainder the exam is unremarkable.  Chest x-ray is normal  I did explain the findings to the patient.  The spider in the chart looks like a normal house spider has not a black widow.  She is to follow-up with your regular doctor as needed.  Return emergency department if worsening.  Given a prescription of prednisone and discharged stable    ZANAY COLGLAZIER was evaluated in Emergency Department on 02/26/2020 for the symptoms described in the history of present illness. She was evaluated in the context of the global COVID-19 pandemic, which necessitated consideration that the patient might be at risk for infection with the SARS-CoV-2 virus that causes COVID-19. Institutional protocols and algorithms that pertain to the evaluation of  patients at risk for COVID-19 are in a state of rapid change based on information released by regulatory bodies including the CDC and federal and state organizations. These policies and algorithms were followed during the patient's care in the ED.   As part of my medical decision making, I reviewed the following data within the [place] notes reviewed and incorporated, Old chart reviewed, Radiograph reviewed , Notes from prior ED visits and [place] Controlled Substance Database  ____________________________________________   FINAL CLINICAL IMPRESSION(S) / ED DIAGNOSES  Final diagnoses:  Spider bite wound, accidental or unintentional, initial encounter  Rib pain      NEW MEDICATIONS STARTED DURING THIS VISIT:  Discharge Medication List as of 02/26/2020  8:59 PM    START taking these medications   Details  predniSONE (STERAPRED UNI-PAK 21 TAB) 10 MG (21) TBPK tablet Take 6 pills on day one then decrease by 1 pill each  day, Print         Note:  This document was prepared using Dragon voice recognition software and may include unintentional dictation errors.    Versie Starks, PA-C 02/26/20 Rob Bunting    Delman Kitten, MD 02/27/20 Laureen Abrahams

## 2020-02-26 NOTE — ED Notes (Signed)
See triage note. Pt presents crushed insect in small glass jar and reports that she was bitten on the middle finger of her left hand two nights ago. No swelling or erythema evident. Pt reports shortness of breath since reported bit. Pt appears NAD, no respiratory Sx evident.

## 2020-03-15 ENCOUNTER — Ambulatory Visit: Payer: Medicare Other | Admitting: Obstetrics and Gynecology

## 2020-03-25 NOTE — Progress Notes (Signed)
03/26/20 9:01 PM   Hannah Shaw 1996-10-20 [id number]  Referring provider: Ok Edwards, NP [place],  [place] [address] Chief Complaint  Patient presents with  . Hematuria    HPI: Hannah Shaw is a 24 y.o. F who presents today for the evaluation and management of microscopic hematuria. Accompanied w/ father.    Visited PCP on 03/05/20 c/o IBS/chronic abdominal pain. UA from visit revealed 4-10 RBC and rare bacteria consistent w/ UA from 01/2020.   CT A/P wo contrast from 01/28/20 unremarkable for hematuria.   She reports of middle back pain. She denies burning w/ urination or dysuria. She had a kidney stone which she passed when she was 24 yo. No recent upper tract imaging. She has had 2 abdominal surgeries for her endometriosis.   She is a smoker. Her parents are also smokers.   PMH: Past Medical History:  Diagnosis Date  . Anxiety   . Asthma    WELL CONTROLLED  . Chronic kidney disease    H/O STONES  . Endometriosis   . GERD (gastroesophageal reflux disease)   . Headache   . History of kidney stones   . History of ovarian cyst   . Hypothyroidism   . Miscarriage   . Thyroid disease     Surgical History: Past Surgical History:  Procedure Laterality Date  . APPENDECTOMY    . COLONOSCOPY WITH PROPOFOL N/A 09/08/2019   Procedure: COLONOSCOPY WITH PROPOFOL;  Surgeon: Robert Bellow, MD;  Location: [facility] ENDOSCOPY;  Service: Endoscopy;  Laterality: N/A;  . DIAGNOSTIC LAPAROSCOPY    . ENDOMETRIAL BIOPSY    . ESOPHAGOGASTRODUODENOSCOPY (EGD) WITH PROPOFOL N/A 09/08/2019   Procedure: ESOPHAGOGASTRODUODENOSCOPY (EGD) WITH PROPOFOL;  Surgeon: Robert Bellow, MD;  Location: [facility] ENDOSCOPY;  Service: Endoscopy;  Laterality: N/A;  . LAPAROSCOPY N/A 03/13/2016   Procedure: LAPAROSCOPY DIAGNOSTIC;  Surgeon: Honor Loh Ward, MD;  Location: [facility] ORS;  Service: Gynecology;  Laterality: N/A;    Home Medications:    Allergies as of 03/26/2020      Reactions   Morphine And Related Hives, Shortness Of Breath   Amitriptyline Other (See Comments)   Leg Pain   Fentanyl    PT DENIES THIS ALLERGY DURING PHONE INTERVIEW ON 03-05-16   Quetiapine Other (See Comments)   Really Bad shakes and joint pain.   Sertraline    Other reaction(s): Vomiting   Betadine [povidone Iodine] Rash   Iodine Rash      Medication List       Accurate as of March 26, 2020  9:01 PM. If you have any questions, ask your nurse or doctor.        STOP taking these medications   clonazePAM 0.5 MG tablet Commonly known as: KLONOPIN Stopped by: Hollice Espy, MD   medroxyPROGESTERone 150 MG/ML injection Commonly known as: DEPO-PROVERA Stopped by: Hollice Espy, MD   mirtazapine 15 MG tablet Commonly known as: REMERON Stopped by: Hollice Espy, MD   predniSONE 10 MG (21) Tbpk tablet Commonly known as: STERAPRED UNI-PAK 21 TAB Stopped by: Hollice Espy, MD     TAKE these medications   albuterol 108 (90 Base) MCG/ACT inhaler Commonly known as: VENTOLIN HFA Inhale 2 puffs into the lungs every 4 (four) hours as needed for wheezing or shortness of breath.   cyanocobalamin 1000 MCG/ML injection Commonly known as: (VITAMIN B-12) Inject into the muscle.   diclofenac Sodium 1 % Gel Commonly known as: VOLTAREN  dicyclomine 20 MG tablet Commonly known as: BENTYL Take 1 tablet (20 mg total) by mouth 3 (three) times daily before meals for 7 days. What changed: Another medication with the same name was removed. Continue taking this medication, and follow the directions you see here. Changed by: Hollice Espy, MD   Flovent HFA 110 MCG/ACT inhaler Generic drug: fluticasone   Fluoxetine HCl (PMDD) 10 MG Tabs Take by mouth.   ibuprofen 600 MG tablet Commonly known as: ADVIL Take 600 mg by mouth every 6 (six) hours as needed.   ipratropium 17 MCG/ACT inhaler Commonly known as: ATROVENT HFA Inhale 2 puffs into the  lungs every 6 (six) hours.   levothyroxine 25 MCG tablet Commonly known as: SYNTHROID Take 25 mcg by mouth daily before breakfast.   medroxyPROGESTERone 10 MG tablet Commonly known as: PROVERA Take 10 mg by mouth daily.   naproxen 500 MG tablet Commonly known as: Naprosyn Take 1 tablet (500 mg total) by mouth 2 (two) times daily with a meal.   Nora-BE 0.35 MG tablet Generic drug: norethindrone Take 1 tablet by mouth daily.   oxyCODONE-acetaminophen 5-325 MG tablet Commonly known as: PERCOCET/ROXICET Take 1 tablet by mouth 3 (three) times daily as needed.   pantoprazole 40 MG tablet Commonly known as: PROTONIX Take 40 mg by mouth daily.   prazosin 1 MG capsule Commonly known as: MINIPRESS Take 1 mg by mouth 2 (two) times daily.   Sprintec 28 0.25-35 MG-MCG tablet Generic drug: norgestimate-ethinyl estradiol Take 1 tablet by mouth daily.   Vitamin D (Ergocalciferol) 1.25 MG (50000 UNIT) Caps capsule Commonly known as: DRISDOL       Allergies:  Allergies  Allergen Reactions  . Morphine And Related Hives and Shortness Of Breath  . Amitriptyline Other (See Comments)    Leg Pain  . Fentanyl     PT DENIES THIS ALLERGY DURING PHONE INTERVIEW ON 03-05-16  . Quetiapine Other (See Comments)    Really Bad shakes and joint pain.  . Sertraline     Other reaction(s): Vomiting  . Betadine [Povidone Iodine] Rash  . Iodine Rash    Family History: Family History  Problem Relation Age of Onset  . Cervical cancer Mother   . Lung cancer Paternal Uncle     Social History:  reports that she has been smoking cigarettes. She has been smoking about 0.50 packs per day. She has never used smokeless tobacco. She reports that she does not drink alcohol or use drugs.   Physical Exam: BP 112/72   Pulse (!) 112   Ht 5\' 3"  (1.6 m)   Wt 105 lb (47.6 kg)   LMP  (LMP Unknown) Comment: believes LMP was april 2021  BMI 18.60 kg/m   Constitutional:  Alert and oriented, No acute  distress.  Accompanied by father today who provides much of history. HEENT: [place] AT, moist mucus membranes.  Trachea midline, no masses. Cardiovascular: No clubbing, cyanosis, or edema. Respiratory: Normal respiratory effort, no increased work of breathing. Skin: No rashes, bruises or suspicious lesions. Neurologic: Grossly intact, no focal deficits, moving all 4 extremities. Psychiatric: Normal mood and affect.  Laboratory Data:  Lab Results  Component Value Date   CREATININE 0.57 02/06/2020   Assessment & Plan:    1. Microscopic hematuria  DDx discussed totday We discussed the differential diagnosis for microscopic hematuria including nephrolithiasis, renal or upper tract tumors, bladder stones, UTIs, or bladder tumors as well as undetermined etiologies. Per AUA guidelines, I did recommend consideration of  microscopic hematuria evaluation including RUS, and office cystoscopy.    [place] [address] [place], [place] [address] [phone]  I, Lucas Mallow, am acting as a scribe for Dr. Hollice Espy,  I have reviewed the above documentation for accuracy and completeness, and I agree with the above.   Hollice Espy, MD

## 2020-03-26 ENCOUNTER — Encounter (INDEPENDENT_AMBULATORY_CARE_PROVIDER_SITE_OTHER): Payer: Self-pay

## 2020-03-26 ENCOUNTER — Encounter: Payer: Self-pay | Admitting: Urology

## 2020-03-26 ENCOUNTER — Ambulatory Visit (INDEPENDENT_AMBULATORY_CARE_PROVIDER_SITE_OTHER): Payer: Medicare Other | Admitting: Urology

## 2020-03-26 ENCOUNTER — Other Ambulatory Visit: Payer: Self-pay

## 2020-03-26 VITALS — BP 112/72 | HR 112 | Ht 63.0 in | Wt 105.0 lb

## 2020-03-26 DIAGNOSIS — R3129 Other microscopic hematuria: Secondary | ICD-10-CM | POA: Diagnosis not present

## 2020-03-26 NOTE — Patient Instructions (Signed)
Cystoscopy Cystoscopy is a procedure that is used to help diagnose and sometimes treat conditions that affect the lower urinary tract. The lower urinary tract includes the bladder and the urethra. The urethra is the tube that drains urine from the bladder. Cystoscopy is done using a thin, tube-shaped instrument with a light and camera at the end (cystoscope). The cystoscope may be hard or flexible, depending on the goal of the procedure. The cystoscope is inserted through the urethra, into the bladder. Cystoscopy may be recommended if you have:  Urinary tract infections that keep coming back.  Blood in the urine (hematuria).  An inability to control when you urinate (urinary incontinence) or an overactive bladder.  Unusual cells found in a urine sample.  A blockage in the urethra, such as a urinary stone.  Painful urination.  An abnormality in the bladder found during an intravenous pyelogram (IVP) or CT scan. Cystoscopy may also be done to remove a sample of tissue to be examined under a microscope (biopsy). What are the risks? Generally, this is a safe procedure. However, problems may occur, including:  Infection.  Bleeding.  What happens during the procedure?  1. You will be given one or more of the following: ? A medicine to numb the area (local anesthetic). 2. The area around the opening of your urethra will be cleaned. 3. The cystoscope will be passed through your urethra into your bladder. 4. Germ-free (sterile) fluid will flow through the cystoscope to fill your bladder. The fluid will stretch your bladder so that your health care provider can clearly examine your bladder walls. 5. Your doctor will look at the urethra and bladder. 6. The cystoscope will be removed The procedure may vary among health care providers  What can I expect after the procedure? After the procedure, it is common to have: 1. Some soreness or pain in your abdomen and urethra. 2. Urinary symptoms.  These include: ? Mild pain or burning when you urinate. Pain should stop within a few minutes after you urinate. This may last for up to 1 week. ? A small amount of blood in your urine for several days. ? Feeling like you need to urinate but producing only a small amount of urine. Follow these instructions at home: General instructions  Return to your normal activities as told by your health care provider.   Do not drive for 24 hours if you were given a sedative during your procedure.  Watch for any blood in your urine. If the amount of blood in your urine increases, call your health care provider.  If a tissue sample was removed for testing (biopsy) during your procedure, it is up to you to get your test results. Ask your health care provider, or the department that is doing the test, when your results will be ready.  Drink enough fluid to keep your urine pale yellow.  Keep all follow-up visits as told by your health care provider. This is important. Contact a health care provider if you:  Have pain that gets worse or does not get better with medicine, especially pain when you urinate.  Have trouble urinating.  Have more blood in your urine. Get help right away if you:  Have blood clots in your urine.  Have abdominal pain.  Have a fever or chills.  Are unable to urinate. Summary  Cystoscopy is a procedure that is used to help diagnose and sometimes treat conditions that affect the lower urinary tract.  Cystoscopy is done using   a thin, tube-shaped instrument with a light and camera at the end.  After the procedure, it is common to have some soreness or pain in your abdomen and urethra.  Watch for any blood in your urine. If the amount of blood in your urine increases, call your health care provider.  If you were prescribed an antibiotic medicine, take it as told by your health care provider. Do not stop taking the antibiotic even if you start to feel better. This  information is not intended to replace advice given to you by your health care provider. Make sure you discuss any questions you have with your health care provider. Document Revised: 10/04/2018 Document Reviewed: 10/04/2018 Elsevier Patient Education  2020 Elsevier Inc.   

## 2020-03-27 LAB — MICROSCOPIC EXAMINATION

## 2020-03-27 LAB — URINALYSIS, COMPLETE
Bilirubin, UA: NEGATIVE
Glucose, UA: NEGATIVE
Ketones, UA: NEGATIVE
Leukocytes,UA: NEGATIVE
Nitrite, UA: NEGATIVE
Protein,UA: NEGATIVE
Specific Gravity, UA: 1.02 (ref 1.005–1.030)
Urobilinogen, Ur: 0.2 mg/dL (ref 0.2–1.0)
pH, UA: 6 (ref 5.0–7.5)

## 2020-03-28 ENCOUNTER — Other Ambulatory Visit: Payer: Self-pay | Admitting: Obstetrics and Gynecology

## 2020-03-28 ENCOUNTER — Telehealth: Payer: Self-pay

## 2020-03-28 DIAGNOSIS — Z3201 Encounter for pregnancy test, result positive: Secondary | ICD-10-CM

## 2020-03-28 DIAGNOSIS — O3680X0 Pregnancy with inconclusive fetal viability, not applicable or unspecified: Secondary | ICD-10-CM

## 2020-03-28 NOTE — Telephone Encounter (Signed)
I can't put it in she has medicare so she needs a waiver printed.  A provider in clinic will need to do it because I can't put it in as a future order without printing the waiver

## 2020-03-28 NOTE — Telephone Encounter (Signed)
I went up to [facility] to see if she could put in order for pt and I was advised to call pt and make sure she wasn't having any vag spotting/bleeding. If she isn't, she should wait until her appt here to est care (04/02/20). Pt has been seen in 3 other practices so she should contact her latest one where she was seen. When I called pt to advise per our midwife, she hung up on me.

## 2020-03-28 NOTE — Telephone Encounter (Signed)
Pt called after hours nurse line and said she was having pain below her navel in the middle. She would like an hcg order put in due to a home UPT positive test and is concerned due to a miscarriage 2 yrs ago. Has appt with SDJ 04/02/20 for pelvic pain. Can you pls put order in?

## 2020-03-28 NOTE — Telephone Encounter (Signed)
Can you put order in for pt pls?

## 2020-04-02 ENCOUNTER — Ambulatory Visit: Payer: Medicare Other | Admitting: Obstetrics and Gynecology

## 2020-04-11 ENCOUNTER — Other Ambulatory Visit: Payer: Self-pay

## 2020-04-11 ENCOUNTER — Ambulatory Visit
Admission: RE | Admit: 2020-04-11 | Discharge: 2020-04-11 | Disposition: A | Discharge status: Home / Self Care | Payer: Medicare Other | Source: Ambulatory Visit | Attending: Urology | Admitting: Urology

## 2020-04-11 DIAGNOSIS — R3129 Other microscopic hematuria: Secondary | ICD-10-CM | POA: Diagnosis present

## 2020-04-15 NOTE — Progress Notes (Signed)
   04/16/20   CC:  Chief Complaint  Patient presents with  . Cysto    HPI: Hannah Shaw is a 24 y.o. F w/ hx of microscopic hematuria returns today for a cysto.   Visited PCP on 03/05/20 c/o IBS/chronic abdominal pain. UA from visit revealed 4-10 RBC and rare bacteria consistent w/ UA from 01/2020.   CT A/P wo contrast from 01/28/20 unremarkable for hematuria.  On previous visit she c/o middle back pain. She denied burning w/ urination or dysuria. She has had 2 abdominal surgeries for her endometriosis.   RUS from 04/11/20 unremarkable.   She is a smoker. Her parents are also smokers.   Today's Vitals   04/16/20 1439  BP: 117/80  Pulse: (!) 101  Weight: 105 lb (47.6 kg)   Body mass index is 18.6 kg/m. NED. A&Ox3.   No respiratory distress   Abd soft, NT, ND Normal external genitalia with patent urethral meatus  Cystoscopy Procedure Note  Patient identification was confirmed, informed consent was obtained, and patient was prepped using Betadine solution.  Lidocaine jelly was administered per urethral meatus.    Procedure: - Flexible cystoscope introduced, without any difficulty.   - Thorough search of the bladder revealed:    normal urethral meatus    normal urothelium    no stones    no ulcers     no tumors    no urethral polyps    no trabeculation  - Ureteral orifices were normal in position and appearance.  Post-Procedure: - Patient tolerated the procedure well  Pertinent Imagings:  CLINICAL DATA:  Initial evaluation for microscopic hematuria.  EXAM: RENAL / URINARY TRACT ULTRASOUND COMPLETE  COMPARISON:  Prior CT from 01/28/2020.  FINDINGS: Right Kidney:  Renal measurements: 10.4 x 3.8 x 4.5 cm = volume: 94 mL. Renal echogenicity within normal limits. No nephrolithiasis or hydronephrosis. No focal renal mass.  Left Kidney:  Renal measurements: 10.3 x 5.2 x 4.6 cm = volume: 131 mL. Renal echogenicity within normal limits. No  nephrolithiasis or hydronephrosis. No focal renal mass.  Bladder:  Appears normal for degree of bladder distention. Bilateral jets are visualized.  Other:  None.  IMPRESSION: Normal renal ultrasound. No nephrolithiasis, focal renal mass, or other abnormality to explain patient's symptoms identified.   Electronically Signed   By: Jeannine Boga M.D.   On: 04/11/2020 23:01  I have personally reviewed the images and agree with radiologist interpretation.   Assessment/ Plan:  1. Microscopic hematuria  RUS unremarkable  NED on cysto  F/u prn   I, Nethusan Sivanesan, am acting as a scribe for Dr. Hollice Espy,  I have reviewed the above documentation for accuracy and completeness, and I agree with the above.   Hollice Espy, MD

## 2020-04-16 ENCOUNTER — Other Ambulatory Visit: Payer: Self-pay

## 2020-04-16 ENCOUNTER — Ambulatory Visit (INDEPENDENT_AMBULATORY_CARE_PROVIDER_SITE_OTHER): Payer: Medicare Other | Admitting: Urology

## 2020-04-16 VITALS — BP 117/80 | HR 101 | Wt 105.0 lb

## 2020-04-16 DIAGNOSIS — R1084 Generalized abdominal pain: Secondary | ICD-10-CM | POA: Insufficient documentation

## 2020-04-16 DIAGNOSIS — R3129 Other microscopic hematuria: Secondary | ICD-10-CM | POA: Diagnosis not present

## 2020-04-17 ENCOUNTER — Other Ambulatory Visit: Payer: Self-pay | Admitting: Urology

## 2020-04-18 LAB — URINALYSIS, COMPLETE
Bilirubin, UA: NEGATIVE
Glucose, UA: NEGATIVE
Ketones, UA: NEGATIVE
Leukocytes,UA: NEGATIVE
Nitrite, UA: NEGATIVE
Protein,UA: NEGATIVE
Specific Gravity, UA: 1.02 (ref 1.005–1.030)
Urobilinogen, Ur: 0.2 mg/dL (ref 0.2–1.0)
pH, UA: 8.5 — ABNORMAL HIGH (ref 5.0–7.5)

## 2020-04-18 LAB — MICROSCOPIC EXAMINATION

## 2020-04-23 ENCOUNTER — Institutional Professional Consult (permissible substitution): Payer: Medicare Other | Admitting: Pulmonary Disease

## 2020-04-30 ENCOUNTER — Encounter: Payer: Medicare Other | Admitting: Obstetrics and Gynecology

## 2020-05-02 ENCOUNTER — Other Ambulatory Visit: Payer: Self-pay

## 2020-05-02 ENCOUNTER — Emergency Department: Payer: Medicare Other

## 2020-05-02 ENCOUNTER — Encounter: Payer: Self-pay | Admitting: *Deleted

## 2020-05-02 DIAGNOSIS — Y9289 Other specified places as the place of occurrence of the external cause: Secondary | ICD-10-CM | POA: Insufficient documentation

## 2020-05-02 DIAGNOSIS — W228XXA Striking against or struck by other objects, initial encounter: Secondary | ICD-10-CM | POA: Diagnosis not present

## 2020-05-02 DIAGNOSIS — Y999 Unspecified external cause status: Secondary | ICD-10-CM | POA: Insufficient documentation

## 2020-05-02 DIAGNOSIS — Z5321 Procedure and treatment not carried out due to patient leaving prior to being seen by health care provider: Secondary | ICD-10-CM | POA: Insufficient documentation

## 2020-05-02 DIAGNOSIS — S8000XA Contusion of unspecified knee, initial encounter: Secondary | ICD-10-CM | POA: Diagnosis not present

## 2020-05-02 DIAGNOSIS — Y939 Activity, unspecified: Secondary | ICD-10-CM | POA: Insufficient documentation

## 2020-05-02 DIAGNOSIS — Y929 Unspecified place or not applicable: Secondary | ICD-10-CM | POA: Insufficient documentation

## 2020-05-02 NOTE — ED Triage Notes (Signed)
Pt has pain and swelling to right knee.  Pt struck knee on the sofa last night.  Bruising to knee noted.  Pt alert.

## 2020-05-03 ENCOUNTER — Emergency Department
Admission: EM | Admit: 2020-05-03 | Discharge: 2020-05-03 | Disposition: A | Discharge status: Home / Self Care | Payer: Medicare Other | Attending: Emergency Medicine | Admitting: Emergency Medicine

## 2020-05-08 ENCOUNTER — Encounter (HOSPITAL_COMMUNITY): Payer: Self-pay | Admitting: Emergency Medicine

## 2020-05-08 ENCOUNTER — Other Ambulatory Visit: Payer: Self-pay

## 2020-05-08 ENCOUNTER — Emergency Department (HOSPITAL_COMMUNITY)
Admission: EM | Admit: 2020-05-08 | Discharge: 2020-05-08 | Disposition: A | Discharge status: Home / Self Care | Payer: Medicare Other | Attending: Emergency Medicine | Admitting: Emergency Medicine

## 2020-05-08 DIAGNOSIS — M25561 Pain in right knee: Secondary | ICD-10-CM | POA: Diagnosis not present

## 2020-05-08 DIAGNOSIS — Z5321 Procedure and treatment not carried out due to patient leaving prior to being seen by health care provider: Secondary | ICD-10-CM | POA: Diagnosis not present

## 2020-05-08 NOTE — ED Notes (Signed)
Pt refused ice pack- pt had a knee xray at [facility] on 05/02/20

## 2020-05-08 NOTE — ED Triage Notes (Signed)
Pt c/o right knee pain that began a week ago.

## 2020-05-17 ENCOUNTER — Telehealth: Payer: Self-pay

## 2020-05-17 ENCOUNTER — Other Ambulatory Visit: Payer: Self-pay | Admitting: Obstetrics and Gynecology

## 2020-05-17 DIAGNOSIS — N921 Excessive and frequent menstruation with irregular cycle: Secondary | ICD-10-CM

## 2020-05-17 MED ORDER — MEDROXYPROGESTERONE ACETATE 10 MG PO TABS: 10.0000 mg | ORAL_TABLET | Freq: Every day | ORAL | 0 refills | Status: DC

## 2020-05-17 NOTE — Telephone Encounter (Signed)
Please advise 

## 2020-05-17 NOTE — Telephone Encounter (Signed)
Rx for Provera sent

## 2020-05-17 NOTE — Telephone Encounter (Signed)
Pt has an appointment on 8/4 with SDJ, she states she has been bleeding for 6 weeks, would like something to help stop the bleeding until her appointment.

## 2020-05-20 NOTE — Telephone Encounter (Signed)
Pt awre via vm

## 2020-05-21 ENCOUNTER — Other Ambulatory Visit: Payer: Self-pay | Admitting: Family Medicine

## 2020-05-21 DIAGNOSIS — S8991XA Unspecified injury of right lower leg, initial encounter: Secondary | ICD-10-CM

## 2020-05-21 DIAGNOSIS — M25561 Pain in right knee: Secondary | ICD-10-CM

## 2020-05-29 ENCOUNTER — Encounter: Payer: Medicare Other | Admitting: Obstetrics and Gynecology

## 2020-06-07 ENCOUNTER — Ambulatory Visit: Payer: Medicare Other

## 2020-06-12 ENCOUNTER — Institutional Professional Consult (permissible substitution): Payer: Medicare Other | Admitting: Pulmonary Disease

## 2020-06-18 ENCOUNTER — Telehealth: Payer: Self-pay

## 2020-06-18 NOTE — Telephone Encounter (Signed)
Pt called wanting an appt c SDJ; has had several appts which she cancelled; states she woke up yesterday morning drenched in blood; not saturating a pad q64min-1hr today - is using a larger pad; adv the earliest appt would be with [facility]; SDJ would be further out; pt states she will see [facility]; adv we need her to keep this appt b/c there are a lot of other pts who need to be seen too; pt states she will keep appt; before tx to [facility] I adv pt it is our policy that if she is saturating a pad q23min-1hr for her to go to the ED.

## 2020-06-20 ENCOUNTER — Ambulatory Visit: Payer: Medicare Other | Admitting: Obstetrics & Gynecology

## 2020-06-24 ENCOUNTER — Other Ambulatory Visit: Payer: Medicare Other

## 2020-07-04 ENCOUNTER — Other Ambulatory Visit: Payer: Self-pay

## 2020-07-04 ENCOUNTER — Ambulatory Visit
Admission: RE | Admit: 2020-07-04 | Discharge: 2020-07-04 | Disposition: A | Discharge status: Home / Self Care | Payer: Medicare Other | Source: Ambulatory Visit | Attending: Family Medicine | Admitting: Family Medicine

## 2020-07-04 DIAGNOSIS — S8991XA Unspecified injury of right lower leg, initial encounter: Secondary | ICD-10-CM

## 2020-07-04 DIAGNOSIS — M25561 Pain in right knee: Secondary | ICD-10-CM

## 2020-07-05 ENCOUNTER — Ambulatory Visit: Payer: Medicare Other | Admitting: Obstetrics & Gynecology

## 2020-07-23 ENCOUNTER — Encounter: Payer: Self-pay | Admitting: Obstetrics & Gynecology

## 2020-07-23 ENCOUNTER — Other Ambulatory Visit: Payer: Self-pay

## 2020-07-23 ENCOUNTER — Ambulatory Visit (INDEPENDENT_AMBULATORY_CARE_PROVIDER_SITE_OTHER): Payer: Medicare Other | Admitting: Obstetrics & Gynecology

## 2020-07-23 ENCOUNTER — Other Ambulatory Visit (HOSPITAL_COMMUNITY)
Admission: RE | Admit: 2020-07-23 | Discharge: 2020-07-23 | Disposition: A | Discharge status: Home / Self Care | Payer: Medicare Other | Source: Ambulatory Visit | Attending: Obstetrics & Gynecology | Admitting: Obstetrics & Gynecology

## 2020-07-23 ENCOUNTER — Telehealth: Payer: Self-pay | Admitting: Obstetrics & Gynecology

## 2020-07-23 VITALS — BP 120/80 | Ht 62.0 in | Wt 107.0 lb

## 2020-07-23 DIAGNOSIS — N921 Excessive and frequent menstruation with irregular cycle: Secondary | ICD-10-CM | POA: Diagnosis not present

## 2020-07-23 DIAGNOSIS — N9489 Other specified conditions associated with female genital organs and menstrual cycle: Secondary | ICD-10-CM | POA: Diagnosis not present

## 2020-07-23 DIAGNOSIS — Z113 Encounter for screening for infections with a predominantly sexual mode of transmission: Secondary | ICD-10-CM

## 2020-07-23 NOTE — Patient Instructions (Signed)
Menorrhagia Menorrhagia is when your menstrual periods are heavy or last longer than normal. Follow these instructions at home: Medicines   Take over-the-counter and prescription medicines exactly as told by your doctor. This includes iron pills.  Do not change or switch medicines without asking your doctor.  Do not take aspirin or medicines that contain aspirin 1 week before or during your period. Aspirin may make bleeding worse. General instructions  If you need to change your pad or tampon more than once every 2 hours, limit your activity until the bleeding stops.  Iron pills can cause problems when pooping (constipation). To prevent or treat pooping problems while taking prescription iron pills, your doctor may suggest that you: ? Drink enough fluid to keep your pee (urine) clear or pale yellow. ? Take over-the-counter or prescription medicines. ? Eat foods that are high in fiber. These foods include:  Fresh fruits and vegetables.  Whole grains.  Beans. ? Limit foods that are high in fat and processed sugars. This includes fried and sweet foods.  Eat healthy meals and foods that are high in iron. Foods that have a lot of iron include: ? Leafy green vegetables. ? Meat. ? Liver. ? Eggs. ? Whole grain breads and cereals.  Do not try to lose weight until your heavy bleeding has stopped and you have normal amounts of iron in your blood. If you need to lose weight, work with your doctor.  Keep all follow-up visits as told by your doctor. This is important. Contact a doctor if:  You soak through a pad or tampon every 1 or 2 hours, and this happens every time you have a period.  You need to use pads and tampons at the same time because you are bleeding so much.  You are taking medicine and you: ? Feel sick to your stomach (nauseous). ? Throw up (vomit). ? Have watery poop (diarrhea).  You have other problems that may be related to the medicine you are taking. Get help  right away if:  You soak through more than a pad or tampon in 1 hour.  You pass clots bigger than 1 inch (2.5 cm) wide.  You feel short of breath.  You feel like your heart is beating too fast.  You feel dizzy or you pass out (faint).  You feel very weak or tired. Summary  Menorrhagia is when your menstrual periods are heavy or last longer than normal.  Take over-the-counter and prescription medicines exactly as told by your doctor. This includes iron pills.  Contact a doctor if you soak through more than a pad or tampon in 1 hour or are passing large clots. This information is not intended to replace advice given to you by your health care provider. Make sure you discuss any questions you have with your health care provider. Document Revised: 01/19/2018 Document Reviewed: 11/02/2016 Elsevier Patient Education  2020 Elsevier Inc.  

## 2020-07-23 NOTE — Telephone Encounter (Signed)
Patient coming in to see [place] on 08/06/2020 for possible Nexplanon insertion.

## 2020-07-23 NOTE — Progress Notes (Signed)
HPI:      [name] is a 24 y.o. G1P0010 who LMP was No LMP recorded., presents today for a problem visit.  She complains of menometrorrhagia that  began several months ago and its severity is described as severe.  She has irregular periods from 21 to 30 days and they are associated with moderate menstrual cramping.  She has used the following for attempts at control: tampon and pad.  Soils clothes.  Unpredictable.  Provera no help.  OCPs no help and had side effects.  Not pregnant.  Previous evaluation: office visit on early 2021 w [place] (cysts).  Prior Diagnosis: dysfunctional uterine bleeding. Previous Treatment: hormones, no help as listed above.  She is single partner, contraception - none.  Hx of STDs: none. She is premenopausal.  PMHx: She  has a past medical history of Anxiety, Asthma, Chronic kidney disease, Endometriosis, GERD (gastroesophageal reflux disease), Headache, History of kidney stones, History of ovarian cyst, Hypothyroidism, Miscarriage, and Thyroid disease. Also,  has a past surgical history that includes Appendectomy; Endometrial biopsy; laparoscopy (N/A, 03/13/2016); Diagnostic laparoscopy; Colonoscopy with propofol (N/A, 09/08/2019); and Esophagogastroduodenoscopy (egd) with propofol (N/A, 09/08/2019)., family history includes Cervical cancer in her mother; Lung cancer in her paternal uncle.,  reports that she has been smoking cigarettes. She has been smoking about 0.50 packs per day. She has never used smokeless tobacco. She reports that she does not drink alcohol and does not use drugs.  She  Current Outpatient Medications:  .  albuterol (VENTOLIN HFA) 108 (90 Base) MCG/ACT inhaler, Inhale 2 puffs into the lungs every 4 (four) hours as needed for wheezing or shortness of breath., Disp: 18 g, Rfl: 0 .  cyanocobalamin (,VITAMIN B-12,) 1000 MCG/ML injection, Inject into the muscle., Disp: , Rfl:  .  diclofenac Sodium (VOLTAREN) 1 % GEL, , Disp: , Rfl:  .  Fluoxetine  HCl, PMDD, 10 MG TABS, Take by mouth., Disp: , Rfl:  .  fluticasone (FLOVENT HFA) 110 MCG/ACT inhaler, , Disp: , Rfl:  .  ibuprofen (ADVIL) 600 MG tablet, Take 600 mg by mouth every 6 (six) hours as needed., Disp: , Rfl:  .  ipratropium (ATROVENT HFA) 17 MCG/ACT inhaler, Inhale 2 puffs into the lungs every 6 (six) hours., Disp: , Rfl:  .  levothyroxine (SYNTHROID, LEVOTHROID) 25 MCG tablet, Take 25 mcg by mouth daily before breakfast. , Disp: , Rfl:  .  medroxyPROGESTERone (PROVERA) 10 MG tablet, Take 10 mg by mouth daily., Disp: , Rfl:  .  naproxen (NAPROSYN) 500 MG tablet, Take 1 tablet (500 mg total) by mouth 2 (two) times daily with a meal., Disp: 20 tablet, Rfl: 00 .  NORA-BE 0.35 MG tablet, Take 1 tablet by mouth daily., Disp: , Rfl:  .  oxyCODONE-acetaminophen (PERCOCET/ROXICET) 5-325 MG tablet, Take 1 tablet by mouth 3 (three) times daily as needed., Disp: , Rfl:  .  pantoprazole (PROTONIX) 40 MG tablet, Take 40 mg by mouth daily., Disp: , Rfl:  .  prazosin (MINIPRESS) 1 MG capsule, Take 1 mg by mouth 2 (two) times daily., Disp: , Rfl:  .  SPRINTEC 28 0.25-35 MG-MCG tablet, Take 1 tablet by mouth daily., Disp: , Rfl:  .  Vitamin D, Ergocalciferol, (DRISDOL) 1.25 MG (50000 UNIT) CAPS capsule, , Disp: , Rfl:  .  dicyclomine (BENTYL) 20 MG tablet, Take 1 tablet (20 mg total) by mouth 3 (three) times daily before meals for 7 days., Disp: 21 tablet, Rfl: 0 .  medroxyPROGESTERone (PROVERA) 10  MG tablet, Take 1 tablet (10 mg total) by mouth daily for 10 days., Disp: 10 tablet, Rfl: 0  Also, is allergic to morphine and related, amitriptyline, fentanyl, quetiapine, sertraline, betadine [povidone iodine], and iodine.  Review of Systems  Constitutional: Negative for chills, fever and malaise/fatigue.  HENT: Negative for congestion, sinus pain and sore throat.   Eyes: Negative for blurred vision and pain.  Respiratory: Negative for cough and wheezing.   Cardiovascular: Negative for chest pain and  leg swelling.  Gastrointestinal: Negative for abdominal pain, constipation, diarrhea, heartburn, nausea and vomiting.  Genitourinary: Positive for frequency. Negative for dysuria, hematuria and urgency.  Musculoskeletal: Negative for back pain, joint pain, myalgias and neck pain.  Skin: Negative for itching and rash.  Neurological: Negative for dizziness, tremors and weakness.  Endo/Heme/Allergies: Bruises/bleeds easily.  Psychiatric/Behavioral: Negative for depression. The patient is nervous/anxious. The patient does not have insomnia.     Objective: BP 120/80   Ht 5\' 2"  (1.575 m)   Wt 107 lb (48.5 kg)   BMI 19.57 kg/m  Physical Exam Constitutional:      General: She is not in acute distress.    Appearance: She is well-developed.  Genitourinary:     Pelvic exam was performed with patient supine.     Vagina and uterus normal.     No vaginal erythema or bleeding.     No cervical motion tenderness, discharge, polyp or nabothian cyst.     Uterus is mobile.     Uterus is not enlarged.     No uterine mass detected.    Uterus is midaxial.     No right or left adnexal mass present.     Right adnexa not tender.     Left adnexa not tender.  HENT:     Head: Normocephalic and atraumatic.     Nose: Nose normal.  Abdominal:     General: There is no distension.     Palpations: Abdomen is soft.     Tenderness: There is no abdominal tenderness.  Musculoskeletal:        General: Normal range of motion.  Neurological:     Mental Status: She is alert and oriented to person, place, and time.     Cranial Nerves: No cranial nerve deficit.  Skin:    General: Skin is warm and dry.  Psychiatric:        Attention and Perception: Attention normal.        Mood and Affect: Mood and affect normal.        Speech: Speech normal.        Behavior: Behavior normal.        Thought Content: Thought content normal.        Judgment: Judgment normal.     ASSESSMENT/PLAN:  menometrorrhagia  Problem  List Items Addressed This Visit    Screen for STD (sexually transmitted disease)    -  Primary   Relevant Orders   Cervicovaginal ancillary only   Menorrhagia with irregular cycle       Relevant Orders   CBC   US PELVIC COMPLETE WITH TRANSVAGINAL   Other specified conditions associated with female genital organs and menstrual cycle        Relevant Orders   Beta hCG quant (ref lab) to rule abnormal pregnancy    Patient has abnormal uterine bleeding . She has a normal exam today, with no evidence of lesions.  Evaluation includes the following: exam, labs such as hormonal testing, and pelvic  ultrasound to evaluate for any structural gynecologic abnormalities.  Patient to follow up after testing.  Treatment option for menorrhagia or menometrorrhagia discussed in great detail with the patient.  Options include hormonal therapy, IUD therapy such as Mirena, D&C, Ablation, and Hysterectomy.  The pros and cons of each option discussed with patient.  Pt considering Nexplanon (her request of this type and info) as next best tx option (prefer hormonal therapy over surgery) Pros and cons discussed, info gv  Barnett Applebaum, MD, [facility] 07/23/2020  1:41 PM

## 2020-07-24 LAB — BETA HCG QUANT (REF LAB): hCG Quant: <1 m[IU]/mL

## 2020-07-24 LAB — CBC
Hematocrit: 44.3 % (ref 34.0–46.6)
Hemoglobin: 15 g/dL (ref 11.1–15.9)
MCH: 32.6 pg (ref 26.6–33.0)
MCHC: 33.9 g/dL (ref 31.5–35.7)
MCV: 96 fL (ref 79–97)
Platelets: 314 10*3/uL (ref 150–450)
RBC: 4.6 x10E6/uL (ref 3.77–5.28)
RDW: 11 % — ABNORMAL LOW (ref 11.7–15.4)
WBC: 7 10*3/uL (ref 3.4–10.8)

## 2020-07-25 ENCOUNTER — Telehealth: Payer: Self-pay

## 2020-07-25 LAB — CERVICOVAGINAL ANCILLARY ONLY
Chlamydia: NEGATIVE
Comment: NEGATIVE
Comment: NEGATIVE
Comment: NORMAL
Neisseria Gonorrhea: NEGATIVE
Trichomonas: NEGATIVE

## 2020-07-25 NOTE — Telephone Encounter (Signed)
Correction:  Pt's Mom's name is Levada Dy.

## 2020-07-25 NOTE — Telephone Encounter (Signed)
Pt's mom, Vernie Shanks, calling; states pt was up all night throwing up, can't keep anything down, stomach is making a popping sound.  [phone]  Adv pt to call her primary doctor as we only take care of these sxs if pt is preg.

## 2020-07-26 ENCOUNTER — Emergency Department: Payer: Medicare Other

## 2020-07-26 ENCOUNTER — Other Ambulatory Visit: Payer: Self-pay

## 2020-07-26 ENCOUNTER — Emergency Department
Admission: EM | Admit: 2020-07-26 | Discharge: 2020-07-26 | Disposition: A | Discharge status: Home / Self Care | Payer: Medicare Other | Attending: Emergency Medicine | Admitting: Emergency Medicine

## 2020-07-26 ENCOUNTER — Institutional Professional Consult (permissible substitution): Payer: Medicare Other | Admitting: Pulmonary Disease

## 2020-07-26 ENCOUNTER — Encounter: Payer: Self-pay | Admitting: Intensive Care

## 2020-07-26 DIAGNOSIS — J4520 Mild intermittent asthma, uncomplicated: Secondary | ICD-10-CM | POA: Insufficient documentation

## 2020-07-26 DIAGNOSIS — Z7951 Long term (current) use of inhaled steroids: Secondary | ICD-10-CM | POA: Insufficient documentation

## 2020-07-26 DIAGNOSIS — R112 Nausea with vomiting, unspecified: Secondary | ICD-10-CM | POA: Insufficient documentation

## 2020-07-26 DIAGNOSIS — K219 Gastro-esophageal reflux disease without esophagitis: Secondary | ICD-10-CM | POA: Insufficient documentation

## 2020-07-26 DIAGNOSIS — R1033 Periumbilical pain: Secondary | ICD-10-CM | POA: Diagnosis not present

## 2020-07-26 DIAGNOSIS — Z79899 Other long term (current) drug therapy: Secondary | ICD-10-CM | POA: Insufficient documentation

## 2020-07-26 DIAGNOSIS — E039 Hypothyroidism, unspecified: Secondary | ICD-10-CM | POA: Insufficient documentation

## 2020-07-26 DIAGNOSIS — F17210 Nicotine dependence, cigarettes, uncomplicated: Secondary | ICD-10-CM | POA: Insufficient documentation

## 2020-07-26 DIAGNOSIS — R109 Unspecified abdominal pain: Secondary | ICD-10-CM | POA: Diagnosis present

## 2020-07-26 DIAGNOSIS — N189 Chronic kidney disease, unspecified: Secondary | ICD-10-CM | POA: Insufficient documentation

## 2020-07-26 LAB — URINALYSIS, COMPLETE (UACMP) WITH MICROSCOPIC
Bacteria, UA: NONE SEEN
Bilirubin Urine: NEGATIVE
Glucose, UA: NEGATIVE mg/dL
Ketones, ur: NEGATIVE mg/dL
Leukocytes,Ua: NEGATIVE
Nitrite: NEGATIVE
Protein, ur: NEGATIVE mg/dL
Specific Gravity, Urine: 1.004 — ABNORMAL LOW (ref 1.005–1.030)
pH: 6 (ref 5.0–8.0)

## 2020-07-26 LAB — COMPREHENSIVE METABOLIC PANEL
ALT: 20 U/L (ref 0–44)
AST: 20 U/L (ref 15–41)
Albumin: 4.7 g/dL (ref 3.5–5.0)
Alkaline Phosphatase: 47 U/L (ref 38–126)
Anion gap: 10 (ref 5–15)
BUN: <5 mg/dL — ABNORMAL LOW (ref 6–20)
CO2: 28 mmol/L (ref 22–32)
Calcium: 9.4 mg/dL (ref 8.9–10.3)
Chloride: 99 mmol/L (ref 98–111)
Creatinine, Ser: 0.65 mg/dL (ref 0.44–1.00)
GFR calc Af Amer: >60 mL/min (ref >60)
GFR calc non Af Amer: >60 mL/min (ref >60)
Glucose, Bld: 100 mg/dL — ABNORMAL HIGH (ref 70–99)
Potassium: 3.4 mmol/L — ABNORMAL LOW (ref 3.5–5.1)
Sodium: 137 mmol/L (ref 135–145)
Total Bilirubin: 0.9 mg/dL (ref 0.3–1.2)
Total Protein: 7.7 g/dL (ref 6.5–8.1)

## 2020-07-26 LAB — CBC
HCT: 41.1 % (ref 36.0–46.0)
Hemoglobin: 14.8 g/dL (ref 12.0–15.0)
MCH: 32.9 pg (ref 26.0–34.0)
MCHC: 36 g/dL (ref 30.0–36.0)
MCV: 91.3 fL (ref 80.0–100.0)
Platelets: 292 10*3/uL (ref 150–400)
RBC: 4.5 MIL/uL (ref 3.87–5.11)
RDW: 10.8 % — ABNORMAL LOW (ref 11.5–15.5)
WBC: 7.3 10*3/uL (ref 4.0–10.5)
nRBC: 0 % (ref 0.0–0.2)

## 2020-07-26 LAB — POCT PREGNANCY, URINE: Preg Test, Ur: NEGATIVE

## 2020-07-26 LAB — LIPASE, BLOOD: Lipase: 22 U/L (ref 11–51)

## 2020-07-26 MED ORDER — ONDANSETRON 4 MG PO TBDP
4.0000 mg | ORAL_TABLET | Freq: Three times a day (TID) | ORAL | 0 refills | Status: DC | PRN
Start: 2020-07-26 — End: 2021-05-22

## 2020-07-26 MED ORDER — ONDANSETRON HCL 4 MG/2ML IJ SOLN
4.0000 mg | Freq: Once | INTRAMUSCULAR | Status: DC
  Filled 2020-07-26: qty 2

## 2020-07-26 MED ORDER — KETOROLAC TROMETHAMINE 30 MG/ML IJ SOLN
30.0000 mg | Freq: Once | INTRAMUSCULAR | Status: DC
  Filled 2020-07-26: qty 1

## 2020-07-26 MED ORDER — KETOROLAC TROMETHAMINE 60 MG/2ML IM SOLN
60.0000 mg | Freq: Once | INTRAMUSCULAR | Status: AC
  Administered 2020-07-26: 60 mg via INTRAMUSCULAR
  Filled 2020-07-26: qty 2

## 2020-07-26 MED ORDER — ONDANSETRON 4 MG PO TBDP
4.0000 mg | ORAL_TABLET | Freq: Once | ORAL | Status: AC
  Administered 2020-07-26: 4 mg via ORAL
  Filled 2020-07-26: qty 1

## 2020-07-26 MED ORDER — SODIUM CHLORIDE 0.9 % IV BOLUS: 1000.0000 mL | Freq: Once | INTRAVENOUS | Status: DC

## 2020-07-26 NOTE — ED Provider Notes (Signed)
[facility] Emergency Department Provider Note  Time seen: 7:53 PM  I have reviewed the triage vital signs and the nursing notes.   HISTORY  Chief Complaint Abdominal Pain   HPI Hannah Shaw is a 24 y.o. female with a past medical history of anxiety, asthma, presents to the emergency department for abdominal pain.  According to the patient for the past 3 days she has been experiencing abdominal pain mostly in the mid abdomen into the right side.  States she is also been nauseated with occasional episodes of vomiting.  Denies any diarrhea.  Denies any fever cough or shortness of breath.  No dysuria or vaginal discharge.  Patient does state vaginal spotting.   Currently describes her pain as cramping type pain mild to moderate in severity.  Past Medical History:  Diagnosis Date  . Anxiety   . Asthma    WELL CONTROLLED  . Chronic kidney disease    H/O STONES  . Endometriosis   . GERD (gastroesophageal reflux disease)   . Headache   . History of kidney stones   . History of ovarian cyst   . Hypothyroidism   . Miscarriage   . Thyroid disease     Patient Active Problem List   Diagnosis Date Noted  . Generalized abdominal pain 04/16/2020  . 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 Chronic pelvic pain in female 06/13/2019  . 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 Hypothyroidism 01/17/2019  . Learning disability 01/17/2019  . 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 Depressive disorder 01/12/2018  . Anxiety 08/27/2014  . Constipation 08/27/2014  . Endometriosis 08/27/2014  . Mild intermittent asthma 08/27/2014  . S/P appendectomy 06/08/2013    Past Surgical History:  Procedure Laterality Date  . APPENDECTOMY    . COLONOSCOPY WITH PROPOFOL N/A 09/08/2019   Procedure: COLONOSCOPY WITH PROPOFOL;  Surgeon: Robert Bellow, MD;  Location: [facility] ENDOSCOPY;  Service: Endoscopy;  Laterality: N/A;  . DIAGNOSTIC LAPAROSCOPY    .  ENDOMETRIAL BIOPSY    . ESOPHAGOGASTRODUODENOSCOPY (EGD) WITH PROPOFOL N/A 09/08/2019   Procedure: ESOPHAGOGASTRODUODENOSCOPY (EGD) WITH PROPOFOL;  Surgeon: Robert Bellow, MD;  Location: [facility] ENDOSCOPY;  Service: Endoscopy;  Laterality: N/A;  . LAPAROSCOPY N/A 03/13/2016   Procedure: LAPAROSCOPY DIAGNOSTIC;  Surgeon: Honor Loh Ward, MD;  Location: [facility] ORS;  Service: Gynecology;  Laterality: N/A;    Prior to Admission medications   Medication Sig Start Date End Date Taking? Authorizing Provider  albuterol (VENTOLIN HFA) 108 (90 Base) MCG/ACT inhaler Inhale 2 puffs into the lungs every 4 (four) hours as needed for wheezing or shortness of breath. 05/14/19   Paulette Blanch, MD  cyanocobalamin (,VITAMIN B-12,) 1000 MCG/ML injection Inject into the muscle. 11/22/19   [provider]  diclofenac Sodium (VOLTAREN) 1 % GEL  02/22/20   [provider]  dicyclomine (BENTYL) 20 MG tablet Take 1 tablet (20 mg total) by mouth 3 (three) times daily before meals for 7 days. 01/28/20 02/04/20  Lilia Pro., MD  Fluoxetine HCl, PMDD, 10 MG TABS Take by mouth.    [provider]  fluticasone (FLOVENT HFA) 110 MCG/ACT inhaler  02/22/20   [provider]  ibuprofen (ADVIL) 600 MG tablet Take 600 mg by mouth every 6 (six) hours as needed. 02/10/20   [provider]  ipratropium (ATROVENT HFA) 17 MCG/ACT inhaler Inhale 2 puffs into the lungs every 6 (six) hours.    [provider]  levothyroxine (SYNTHROID, [place])  25 MCG tablet Take 25 mcg by mouth daily before breakfast.     [provider]  medroxyPROGESTERone (PROVERA) 10 MG tablet Take 10 mg by mouth daily. 07/07/19   [provider]  medroxyPROGESTERone (PROVERA) 10 MG tablet Take 1 tablet (10 mg total) by mouth daily for 10 days. 05/17/20 05/27/20  Will Bonnet, MD  naproxen (NAPROSYN) 500 MG tablet Take 1 tablet (500 mg total) by mouth 2 (two) times daily with a meal. 02/06/20   Sable Feil, PA-C  NORA-BE 0.35 MG tablet Take 1 tablet by mouth daily. 03/20/20   [provider]  oxyCODONE-acetaminophen (PERCOCET/ROXICET) 5-325 MG tablet Take 1 tablet by mouth 3 (three) times daily as needed. 03/11/20   [provider]  pantoprazole (PROTONIX) 40 MG tablet Take 40 mg by mouth daily. 10/03/19   [provider]  prazosin (MINIPRESS) 1 MG capsule Take 1 mg by mouth 2 (two) times daily. 06/08/19   [provider]  [place] 28 0.25-35 MG-MCG tablet Take 1 tablet by mouth daily. 12/14/19   [provider]  Vitamin D, Ergocalciferol, (DRISDOL) 1.25 MG (50000 UNIT) CAPS capsule  03/04/20   [provider]    Allergies  Allergen Reactions  . Morphine And Related Hives and Shortness Of Breath  . Amitriptyline Other (See Comments)    Leg Pain  . Fentanyl     PT DENIES THIS ALLERGY DURING PHONE INTERVIEW ON 03-05-16  . Quetiapine Other (See Comments)    Really Bad shakes and joint pain.  . Sertraline     Other reaction(s): Vomiting  . Betadine [Povidone Iodine] Rash  . Iodine Rash    Family History  Problem Relation Age of Onset  . Cervical cancer Mother   . Lung cancer Paternal Uncle     Social History Social History   Tobacco Use  . Smoking status: Current Every Day Smoker    Packs/day: 0.50    Types: Cigarettes  . Smokeless tobacco: Never Used  Vaping Use  . Vaping Use: Former  Substance Use Topics  . Alcohol use: No  . Drug use: Never    Review of Systems Constitutional: Negative for fever. Cardiovascular: Negative for chest pain. Respiratory: Negative for shortness of breath. Gastrointestinal: 3 days of abdominal cramping.  Positive for nausea vomiting. Genitourinary: Negative for urinary compaints Musculoskeletal: Negative for musculoskeletal complaints Neurological: Negative for headache All other ROS negative  ____________________________________________   PHYSICAL EXAM:  VITAL SIGNS: ED Triage  Vitals  Enc Vitals Group     BP 07/26/20 1559 125/79     Pulse Rate 07/26/20 1559 87     Resp 07/26/20 1559 16     Temp 07/26/20 1559 98.7 F (37.1 C)     Temp Source 07/26/20 1559 Oral     SpO2 07/26/20 1559 100 %     Weight 07/26/20 1600 107 lb (48.5 kg)     Height 07/26/20 1600 5\' 2"  (1.575 m)     Head Circumference --      Peak Flow --      Pain Score 07/26/20 1559 10     Pain Loc --      Pain Edu? --      Excl. in [place]? --    Constitutional: Alert and oriented. Well appearing and in no distress. Eyes: Normal exam ENT      Head: Normocephalic and atraumatic.      Mouth/Throat: Mucous membranes are moist. Cardiovascular: Normal rate, regular rhythm. No murmur  Respiratory: Normal respiratory effort without tachypnea nor retractions. Breath sounds are clear Gastrointestinal: Soft, mild periumbilical and right-sided tenderness to palpation.  No rebound guarding or distention. Musculoskeletal: Nontender with normal range of motion in all extremities.  Neurologic:  Normal speech and language. No gross focal neurologic deficits Skin:  Skin is warm, dry and intact.  Psychiatric: Mood and affect are normal  ____________________________________________   RADIOLOGY  CT essentially negative for acute abnormality.  ____________________________________________   INITIAL IMPRESSION / ASSESSMENT AND PLAN / ED COURSE  Pertinent labs & imaging results that were available during my care of the patient were reviewed by me and considered in my medical decision making (see chart for details).   Patient presents emergency department for abdominal pain.  Patient has a history abdominal pain but states this seems worse.  Mostly right-sided states she has a history of kidney stones previously as well.  States she has been having vaginal spotting, urinalysis shows hematuria but no other sign of infection.  Pregnancy test is negative.  Lab work otherwise nonrevealing.  However given the patient's  history of kidney stones and her description of right-sided abdominal pain we will obtain a CT renal scan to rule out ureterolithiasis.  Patient agreeable to plan of care.  We will treat the patient with Toradol Zofran and fluids while awaiting CT results.  CT is essentially negative for acute abnormality.  Patient's feeling better after medications.  Will discharge patient home with PCP follow-up and nausea medication.  Patient agreeable to plan of care.  LEATHIE WEICH was evaluated in Emergency Department on 07/26/2020 for the symptoms described in the history of present illness. She was evaluated in the context of the global COVID-19 pandemic, which necessitated consideration that the patient might be at risk for infection with the SARS-CoV-2 virus that causes COVID-19. Institutional protocols and algorithms that pertain to the evaluation of patients at risk for COVID-19 are in a state of rapid change based on information released by regulatory bodies including the CDC and federal and state organizations. These policies and algorithms were followed during the patient's care in the ED.  ____________________________________________   FINAL CLINICAL IMPRESSION(S) / ED DIAGNOSES  Abdominal pain Nausea vomiting   Harvest Dark, MD 07/26/20 2029

## 2020-07-26 NOTE — ED Triage Notes (Signed)
C/o abdominal pain with emesis that started last night. Denies trouble urinating or abnormal discharge.

## 2020-07-26 NOTE — ED Notes (Signed)
Attempted for 22g IV at [facility]. Drew back blood but wouldn't allow for continued threading then vein blew. Will need [place] IV.

## 2020-07-26 NOTE — ED Notes (Signed)
Patient c/o medial/lower abdominal pain, N/V for the past 2 days. Patient denies diarrhea. Patient reports she is able to tolerate fluids, no food.

## 2020-07-26 NOTE — ED Notes (Signed)
Patient transported to CT 

## 2020-08-02 ENCOUNTER — Telehealth: Payer: Self-pay | Admitting: Obstetrics & Gynecology

## 2020-08-02 NOTE — Telephone Encounter (Signed)
Patient is calling for labs results. Patient aware [place] is out of the office until Monday.

## 2020-08-05 NOTE — Telephone Encounter (Signed)
Pt aware.

## 2020-08-06 ENCOUNTER — Other Ambulatory Visit: Payer: Self-pay

## 2020-08-06 ENCOUNTER — Other Ambulatory Visit: Payer: Self-pay | Admitting: Obstetrics & Gynecology

## 2020-08-06 ENCOUNTER — Ambulatory Visit (INDEPENDENT_AMBULATORY_CARE_PROVIDER_SITE_OTHER): Payer: Medicare Other

## 2020-08-06 ENCOUNTER — Ambulatory Visit (INDEPENDENT_AMBULATORY_CARE_PROVIDER_SITE_OTHER): Payer: Medicare Other | Admitting: Obstetrics & Gynecology

## 2020-08-06 ENCOUNTER — Encounter: Payer: Self-pay | Admitting: Obstetrics & Gynecology

## 2020-08-06 VITALS — BP 108/60 | Ht 62.0 in | Wt 105.0 lb

## 2020-08-06 DIAGNOSIS — N921 Excessive and frequent menstruation with irregular cycle: Secondary | ICD-10-CM

## 2020-08-06 MED ORDER — XULANE 150-35 MCG/24HR TD PTWK
1.0000 | MEDICATED_PATCH | TRANSDERMAL | 12 refills | Status: DC
Start: 2020-08-06 — End: 2023-06-18

## 2020-08-06 NOTE — Progress Notes (Signed)
  HPI: Pt has irreg and heavy cycles.  She has [place] today, and is looking into hormonal control of periods.  Ultrasound demonstrates no masses seen These findings are Pelvis normal  PMHx: She  has a past medical history of Anxiety, Asthma, Chronic kidney disease, Endometriosis, GERD (gastroesophageal reflux disease), Headache, History of kidney stones, History of ovarian cyst, Hypothyroidism, Miscarriage, and Thyroid disease. Also,  has a past surgical history that includes Appendectomy; Endometrial biopsy; laparoscopy (N/A, 03/13/2016); Diagnostic laparoscopy; Colonoscopy with propofol (N/A, 09/08/2019); and Esophagogastroduodenoscopy (egd) with propofol (N/A, 09/08/2019)., family history includes Cervical cancer in her mother; Lung cancer in her paternal uncle.,  reports that she has been smoking cigarettes. She has been smoking about 0.50 packs per day. She has never used smokeless tobacco. She reports that she does not drink alcohol and does not use drugs.  She has a current medication list which includes the following prescription(s): albuterol, cyanocobalamin, diclofenac sodium, dicyclomine, fluoxetine hcl (pmdd), flovent hfa, ibuprofen, ipratropium, levothyroxine, naproxen, xulane, ondansetron, pantoprazole, prazosin, and vitamin d (ergocalciferol). Also, is allergic to morphine and related, amitriptyline, fentanyl, quetiapine, sertraline, betadine [povidone iodine], and iodine.  Review of Systems  All other systems reviewed and are negative.   Objective: BP 108/60   Ht 5\' 2"  (1.575 m)   Wt 105 lb (47.6 kg)   BMI 19.20 kg/m   Physical examination Constitutional NAD, Conversant  Skin No rashes, lesions or ulceration.   Extremities: Moves all appropriately.  Normal ROM for age. No lymphadenopathy.  Neuro: Grossly intact  Psych: Oriented to PPT.  Normal mood. Normal affect.   US PELVIS TRANSVAGINAL NON-OB (TV ONLY)  Result Date: 08/06/2020 Patient Name: Hannah Shaw DOB: 09-23-1996  MRN: [id number] ULTRASOUND REPORT Location: [facility] Date of Service: 08/06/2020 Indications:Menometrorrhagia Findings: The uterus is anteverted and measures 5.9 x 34 x 3.1 cm. Echo texture is homogenous without evidence of focal masses. The Endometrium measures 4.5 mm. Right Ovary measures 3.1 x 2.5 x 1.1 cm. It is normal in appearance. Left Ovary measures 2.2 x 2.5 x 1.6 cm. It is normal in appearance. Survey of the adnexa demonstrates no adnexal masses. There is no free fluid in the cul de sac. Impression: 1. Normal pelvic ultrasound. Recommendations: 1.Clinical correlation with the patient's History and Physical Exam. Hannah Shaw, RT Review of ULTRASOUND.    I have personally reviewed images and report of recent ultrasound done at [facility].    Plan of management to be discussed with patient. Barnett Applebaum, MD, [facility], [facility] 08/06/2020  2:36 PM   Assessment:  Menorrhagia with irregular cycle Plan Xulane patch for trial to control and regulate periods SE to OCP in past, counseled what to look for Alternatives discussed, incl Nexplanon as a good option Concern for complinace too Reassess 2 months  A total of 20 minutes were spent face-to-face with the patient as well as preparation, review, communication, and documentation during this encounter.   Barnett Applebaum, MD, [facility], [facility] 08/06/2020  2:49 PM

## 2020-08-06 NOTE — Patient Instructions (Signed)
Ethinyl Estradiol; Norelgestromin skin patches  1 PATCH WEEKLY for 3 weeks, then take a week off, then start over again  What is this medicine? ETHINYL ESTRADIOL;NORELGESTROMIN (ETH in il es tra DYE ole; nor el JES troe min) skin patch is used as a contraceptive (birth control method). This medicine combines two types of female hormones, an estrogen and a progestin. This patch is used to prevent ovulation and pregnancy. This medicine may be used for other purposes; ask your health care provider or pharmacist if you have questions. COMMON BRAND NAME(S): Ortho Becky Sax What should I tell my health care provider before I take this medicine? They need to know if you have or ever had any of these conditions:  abnormal vaginal bleeding  blood vessel disease or blood clots  breast, cervical, endometrial, ovarian, liver, or uterine cancer  diabetes  gallbladder disease  having surgery  heart disease or recent heart attack  high blood pressure  high cholesterol or triglycerides  history of irregular heartbeat or heart valve problems  kidney disease  liver disease  migraine headaches  protein C deficiency  protein S deficiency  recently had a baby, miscarriage, or abortion  stroke  systemic lupus erythematosus (SLE)  tobacco smoker  an unusual or allergic reaction to estrogens, progestins, other medicines, foods, dyes, or preservatives  pregnant or trying to get pregnant  breast-feeding How should I use this medicine? This patch is applied to the skin. Follow the directions on the prescription label. Apply to clean, dry, healthy skin on the buttock, abdomen, upper outer arm or upper torso, in a place where it will not be rubbed by tight clothing. Do not use lotions or other cosmetics on the site where the patch will go. Press the patch firmly in place for 10 seconds to ensure good contact with the skin. Change the patch every 7 days on the same day of the week for 3  weeks. You will then have a break from the patch for 1 week, after which you will apply a new patch. Do not use your medicine more often than directed. Contact your pediatrician regarding the use of this medicine in children. Special care may be needed. This medicine has been used in female children who have started having menstrual periods. A patient package insert for the product will be given with each prescription and refill. Read this sheet carefully each time. The sheet may change frequently. Overdosage: If you think you have taken too much of this medicine contact a poison control center or emergency room at once. NOTE: This medicine is only for you. Do not share this medicine with others. What if I miss a dose? You will need to replace your patch once a week as directed. If your patch is lost or falls off, contact your health care professional for advice. You may need to use another form of birth control if your patch has been off for more than 1 day. What may interact with this medicine? Do not take this medicine with the following medications:  dasabuvir; ombitasvir; paritaprevir; ritonavir  ombitasvir; paritaprevir; ritonavir This medicine may also interact with the following medications:  acetaminophen  antibiotics or medicines for infections, especially rifampin, rifabutin, rifapentine, and possibly penicillins or tetracyclines  aprepitant or fosaprepitant  armodafinil  ascorbic acid (vitamin C)  barbiturate medicines, such as phenobarbital or primidone  bosentan  certain antiviral medicines for hepatitis, HIV or AIDS  certain medicines for cancer treatment  certain medicines for seizures like carbamazepine,  clobazam, felbamate, lamotrigine, oxcarbazepine, phenytoin, rufinamide, topiramate  certain medicines for treating high cholesterol  cyclosporine  dantrolene  elagolix  flibanserin  grapefruit juice  lesinurad  medicines for diabetes  medicines to  treat fungal infections, such as griseofulvin, miconazole, fluconazole, ketoconazole, itraconazole, posaconazole or voriconazole  mifepristone  mitotane  modafinil  morphine  mycophenolate  St. John's wort  tamoxifen  temazepam  theophylline or aminophylline  thyroid hormones  tizanidine  tranexamic acid  ulipristal  warfarin This list may not describe all possible interactions. Give your health care provider a list of all the medicines, herbs, non-prescription drugs, or dietary supplements you use. Also tell them if you smoke, drink alcohol, or use illegal drugs. Some items may interact with your medicine. What should I watch for while using this medicine? Visit your doctor or health care professional for regular checks on your progress. You will need a regular breast and pelvic exam and Pap smear while on this medicine. Use an additional method of contraception during the first cycle that you use this patch. If you have any reason to think you are pregnant, stop using this medicine right away and contact your doctor or health care professional. If you are using this medicine for hormone related problems, it may take several cycles of use to see improvement in your condition. Smoking increases the risk of getting a blood clot or having a stroke while you are using hormonal birth control, especially if you are more than 25 years old. You are strongly advised not to smoke. This medicine can make your body retain fluid, making your fingers, hands, or ankles swell. Your blood pressure can go up. Contact your doctor or health care professional if you feel you are retaining fluid. This medicine can make you more sensitive to the sun. Keep out of the sun. If you cannot avoid being in the sun, wear protective clothing and use sunscreen. Do not use sun lamps or tanning beds/booths. If you wear contact lenses and notice visual changes, or if the lenses begin to feel uncomfortable,  consult your eye care specialist. In some women, tenderness, swelling, or minor bleeding of the gums may occur. Notify your dentist if this happens. Brushing and flossing your teeth regularly may help limit this. See your dentist regularly and inform your dentist of the medicines you are taking. If you are going to have elective surgery or a MRI, you may need to stop using this medicine before the surgery or MRI. Consult your health care professional for advice. This medicine does not protect you against HIV infection (AIDS) or any other sexually transmitted diseases. What side effects may I notice from receiving this medicine? Side effects that you should report to your doctor or health care professional as soon as possible:  allergic reactions such as skin rash or itching, hives, swelling of the lips, mouth, tongue, or throat  breast tissue changes or discharge  dark patches of skin on your forehead, cheeks, upper lip, and chin  depression  high blood pressure  migraines or severe, sudden headaches  missed menstrual periods  signs and symptoms of a blood clot such as breathing problems; changes in vision; chest pain; severe, sudden headache; pain, swelling, warmth in the leg; trouble speaking; sudden numbness or weakness of the face, arm or leg  skin reactions at the patch site such as blistering, bleeding, itching, rash, or swelling  stomach pain  yellowing of the eyes or skin Side effects that usually do not require medical  attention (report these to your doctor or health care professional if they continue or are bothersome):  breast tenderness  irregular vaginal bleeding or spotting, particularly during the first 3 months of use  headache  nausea  painful menstrual periods  skin redness or mild irritation at site where applied  weight gain (slight) This list may not describe all possible side effects. Call your doctor for medical advice about side effects. You may  report side effects to FDA at 1-800-FDA-1088. Where should I keep my medicine? Keep out of the reach of children. Store at room temperature between 15 and 30 degrees C (59 and 86 degrees F). Keep the patch in its pouch until time of use. Throw away any unused medicine after the expiration date. Dispose of used patches properly. Since a used patch may still contain active hormones, fold the patch in half so that it sticks to itself prior to disposal. Throw away in a place where children or pets cannot reach. NOTE: This sheet is a summary. It may not cover all possible information. If you have questions about this medicine, talk to your doctor, pharmacist, or health care provider.  2020 Elsevier/Gold Standard (2019-01-17 11:56:29)

## 2020-08-28 ENCOUNTER — Institutional Professional Consult (permissible substitution): Payer: Medicare Other | Admitting: Pulmonary Disease

## 2020-08-28 ENCOUNTER — Telehealth: Payer: Self-pay | Admitting: Pulmonary Disease

## 2020-08-28 NOTE — Telephone Encounter (Signed)
Per Sharl Ma, patient will need to contact PCP for new referral, as previously referral has been closed.  Patient is aware that a new referral is needed prior to scheduling OV. She will contact PCP.  Nothing further needed.

## 2020-10-08 ENCOUNTER — Ambulatory Visit: Payer: Medicare Other | Admitting: Obstetrics & Gynecology

## 2020-10-09 ENCOUNTER — Ambulatory Visit (INDEPENDENT_AMBULATORY_CARE_PROVIDER_SITE_OTHER): Payer: Medicare Other | Admitting: Pulmonary Disease

## 2020-10-09 ENCOUNTER — Encounter: Payer: Self-pay | Admitting: Pulmonary Disease

## 2020-10-09 ENCOUNTER — Other Ambulatory Visit: Payer: Self-pay

## 2020-10-09 VITALS — BP 110/64 | HR 96 | Temp 97.8°F | Ht 60.0 in | Wt 106.0 lb

## 2020-10-09 DIAGNOSIS — R0602 Shortness of breath: Secondary | ICD-10-CM

## 2020-10-09 DIAGNOSIS — F17210 Nicotine dependence, cigarettes, uncomplicated: Secondary | ICD-10-CM | POA: Diagnosis not present

## 2020-10-09 DIAGNOSIS — J45901 Unspecified asthma with (acute) exacerbation: Secondary | ICD-10-CM

## 2020-10-09 DIAGNOSIS — F819 Developmental disorder of scholastic skills, unspecified: Secondary | ICD-10-CM | POA: Diagnosis not present

## 2020-10-09 MED ORDER — TRELEGY ELLIPTA 100-62.5-25 MCG/INH IN AEPB: 1.0000 | INHALATION_SPRAY | Freq: Every day | RESPIRATORY_TRACT | 11 refills | Status: AC

## 2020-10-09 MED ORDER — TRELEGY ELLIPTA 100-62.5-25 MCG/INH IN AEPB: 1.0000 | INHALATION_SPRAY | Freq: Every day | RESPIRATORY_TRACT | 0 refills | Status: AC

## 2020-10-09 NOTE — Progress Notes (Signed)
Subjective:    Patient ID: Hannah Shaw, female    DOB: July 04, 1996, 24 y.o.   MRN: [id number]  HPI Hannah Shaw is a 24 year old smoker (half PPD) who presents for evaluation of asthma.  She is kindly referred by the [facility].  She has mild developmental delay.  She presents today with her father.  They note that she has had issues with productive cough with clear to yellow sputum.  She has noticed some dyspnea on exertion and wheezing.  She is currently on albuterol and Flovent.  She notes some improvement on her symptoms with these medications but the effect is not lasting.  She has not had any recent ED visits for asthma.  She has had some issues with abdominal pain due to endometriosis that is being followed by GYN and by urology.  She does not endorse any fevers, chills or sweats.  She has some mild orthopnea that has been a longstanding symptom.  No paroxysmal nocturnal dyspnea.  No lower extremity edema.  She states that she has had the symptoms for "a long time" cannot quantitate.  She has resided in [place] all her life.  No exotic travel.  No military history and no occupational history.  No exotic pets.  There are 2 dogs and 1 cat in the home.  CBC on 01/2020 did not show any eosinophilia.   Review of Systems A 10 point review of systems was performed and it is as noted above otherwise negative.  Past Medical History:  Diagnosis Date  . Anxiety   . Asthma    WELL CONTROLLED  . Chronic kidney disease    H/O STONES  . Endometriosis   . GERD (gastroesophageal reflux disease)   . Headache   . History of kidney stones   . History of ovarian cyst   . Hypothyroidism   . Miscarriage   . Thyroid disease    Patient Active Problem List   Diagnosis Date Noted  . Generalized abdominal pain 04/16/2020  . 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 Chronic pelvic pain in female 06/13/2019  . 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  Hypothyroidism 01/17/2019  . Learning disability 01/17/2019  . 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 Depressive disorder 01/12/2018  . Anxiety 08/27/2014  . Constipation 08/27/2014  . Endometriosis 08/27/2014  . Mild intermittent asthma 08/27/2014  . S/P appendectomy 06/08/2013   Past Surgical History:  Procedure Laterality Date  . APPENDECTOMY    . COLONOSCOPY WITH PROPOFOL N/A 09/08/2019   Procedure: COLONOSCOPY WITH PROPOFOL;  Surgeon: Robert Bellow, MD;  Location: [facility] ENDOSCOPY;  Service: Endoscopy;  Laterality: N/A;  . DIAGNOSTIC LAPAROSCOPY    . ENDOMETRIAL BIOPSY    . ESOPHAGOGASTRODUODENOSCOPY (EGD) WITH PROPOFOL N/A 09/08/2019   Procedure: ESOPHAGOGASTRODUODENOSCOPY (EGD) WITH PROPOFOL;  Surgeon: Robert Bellow, MD;  Location: [facility] ENDOSCOPY;  Service: Endoscopy;  Laterality: N/A;  . LAPAROSCOPY N/A 03/13/2016   Procedure: LAPAROSCOPY DIAGNOSTIC;  Surgeon: Honor Loh Ward, MD;  Location: [facility] ORS;  Service: Gynecology;  Laterality: N/A;   Family History  Problem Relation Age of Onset  . Cervical cancer Mother   . Lung cancer Paternal Uncle    Social History   Tobacco Use  . Smoking status: Current Every Day Smoker    Packs/day: 0.75    Years: 3.00    Pack years: 2.25    Types: Cigarettes  . Smokeless tobacco: Never Used  . Tobacco comment: 0.5PPD 10/09/2020  Substance  Use Topics  . Alcohol use: No   Allergies  Allergen Reactions  . Morphine And Related Hives and Shortness Of Breath  . Amitriptyline Other (See Comments)    Leg Pain  . Fentanyl     PT DENIES THIS ALLERGY DURING PHONE INTERVIEW ON 03-05-16  . Quetiapine Other (See Comments)    Really Bad shakes and joint pain.  . Sertraline     Other reaction(s): Vomiting  . Betadine [Povidone Iodine] Rash  . Iodine Rash   Current Meds  Medication Sig  . albuterol (VENTOLIN HFA) 108 (90 Base) MCG/ACT inhaler Inhale 2 puffs into the lungs every 4 (four) hours as needed for wheezing or  shortness of breath.  . cyanocobalamin (,VITAMIN B-12,) 1000 MCG/ML injection Inject into the muscle.  . diclofenac Sodium (VOLTAREN) 1 % GEL   . Fluoxetine HCl, PMDD, 10 MG TABS Take by mouth.  . fluticasone (FLOVENT HFA) 110 MCG/ACT inhaler   . ibuprofen (ADVIL) 600 MG tablet Take 600 mg by mouth every 6 (six) hours as needed.  Marland Kitchen levothyroxine (SYNTHROID, LEVOTHROID) 25 MCG tablet Take 25 mcg by mouth daily before breakfast.   . naproxen (NAPROSYN) 500 MG tablet Take 1 tablet (500 mg total) by mouth 2 (two) times daily with a meal.  . norelgestromin-ethinyl estradiol Marilu Favre) 150-35 MCG/24HR transdermal patch Place 1 patch onto the skin once a week. One patch weekly for 3 weeks, then one week off.  . ondansetron (ZOFRAN ODT) 4 MG disintegrating tablet Take 1 tablet (4 mg total) by mouth every 8 (eight) hours as needed for nausea or vomiting.  . pantoprazole (PROTONIX) 40 MG tablet Take 40 mg by mouth daily.  . prazosin (MINIPRESS) 1 MG capsule Take 1 mg by mouth 2 (two) times daily.  . Vitamin D, Ergocalciferol, (DRISDOL) 1.25 MG (50000 UNIT) CAPS capsule    Immunization History  Administered Date(s) Administered  . HPV Quadrivalent 06/15/2008, 10/01/2008, 06/27/2009  . Hepatitis A, Ped/Adol-2 Dose 06/15/2008, 06/27/2009  . Influenza Split 09/21/2011, 07/28/2012  . Influenza, Seasonal, Injecte, Preservative Fre 10/01/2008, 08/08/2019  . Influenza,inj,Quad PF,6+ Mos 08/01/2013, 08/27/2014, 01/17/2019  . Influenza-Unspecified 01/17/2019  . Meningococcal Conjugate 06/27/2009  . Tdap 06/15/2008, 01/17/2019  . Varicella 06/15/2008       Objective:   Physical Exam BP 110/64 (BP Location: Left Arm, Cuff Size: Normal)   Pulse 96   Temp 97.8 F (36.6 C) (Temporal)   Ht 5' (1.524 m)   Wt 106 lb (48.1 kg)   SpO2 99%   BMI 20.70 kg/m  GENERAL: Thin, slightly built young woman, no acute distress.  No conversational dyspnea.  Fully ambulatory. HEAD: Normocephalic, atraumatic.  EYES:  Pupils equal, round, reactive to light.  No scleral icterus.  MOUTH: Nose/mouth/throat not examined due to masking requirements for COVID 19. NECK: Supple. No thyromegaly. Trachea midline. No JVD.  No adenopathy. PULMONARY: Good air entry bilaterally.  Coarse otherwise, no adventitious sounds. CARDIOVASCULAR: S1 and S2. Regular rate and rhythm.  ABDOMEN: Benign. MUSCULOSKELETAL: No joint deformity, no clubbing, no edema.  NEUROLOGIC: No overt focal deficit.  No overt gait disturbance. SKIN: Intact,warm,dry. PSYCH: Easily distracted.  Mildly anxious.   Chest x-ray performed 26 Feb 2020 independently reviewed,mild hyperinflation noted:        Assessment & Plan:     ICD-10-CM   1. Persistent asthma with acute exacerbation, unspecified asthma severity  J45.901    Will obtain PFTs Discontinue Flovent Trelegy 100/62.525 1 puff daily Continue albuterol as needed  2. Shortness of  breath  R06.02 Pulmonary Function Test [facility] Only   Likely related to air trapping Hyperinflation noted on chest x-ray Optimize bronchodilators as above  3. Tobacco dependence due to cigarettes  F17.210    Patient was counseled regards to discontinuation of smoking Difficult to assess capacity for insight  4. Cognitive developmental delay  F81.9    This issue adds complexity to her management    Orders Placed This Encounter  Procedures  . Pulmonary Function Test [facility] Only    Standing Status:   Future    Number of Occurrences:   1    Standing Expiration Date:   10/09/2021    Scheduling Instructions:     Next available.    Order Specific Question:   Full PFT: includes the following: basic spirometry, spirometry pre & post bronchodilator, diffusion capacity (DLCO), lung volumes    Answer:   Full PFT   Meds ordered this encounter  Medications  . Fluticasone-Umeclidin-Vilant (TRELEGY ELLIPTA) 100-62.5-25 MCG/INH AEPB    Sig: Inhale 1 puff into the lungs daily for 1 day.    Dispense:  14 each    Refill:   0    Order Specific Question:   Lot Number?    Answer:   [id number]    Order Specific Question:   Manufacturer?    Answer:   [employer] [[id number]]    Order Specific Question:   Quantity    Answer:   1  . Fluticasone-Umeclidin-Vilant (TRELEGY ELLIPTA) 100-62.5-25 MCG/INH AEPB    Sig: Inhale 1 puff into the lungs daily.    Dispense:  60 each    Refill:  11   The patient was counseled regards to discontinuation of smoking.  Have some cognitive developmental delay and it is uncertain how much she has been able to comprehend counseling.  She does need optimization of her inhaler therapy and therefore we will change her inhaler to Trelegy Ellipta 100/62.5/25, 1 inhalation daily.  She was taught the proper use of the dry powder inhaler and was able to replicate the maneuvers.  She was instructed she could still use her albuterol as needed.  She was instructed to discontinue Flovent.  We will see her in 2 months time she is to contact us prior to that time should any new difficulties arise.  Renold Don, MD [facility]   *This note was dictated using voice recognition software/Dragon.  Despite best efforts to proofread, errors can occur which can change the meaning.  Any change was purely unintentional.

## 2020-10-09 NOTE — Patient Instructions (Signed)
We are going to schedule some breathing test for you  We are changing your inhaler to Trelegy Ellipta. We will send a prescription in, you received a sample today. Make sure you rinse your mouth well after you use it. Let us know if you have any difficulty getting the medication.  You may use your albuterol inhaler as needed.  You do not need to use your Flovent anymore as the Trelegy has medication like that and it.  We'll see you in follow-up in 2 months time call sooner should any new problems arise

## 2020-10-22 ENCOUNTER — Telehealth: Payer: Self-pay | Admitting: Pulmonary Disease

## 2020-10-22 NOTE — Telephone Encounter (Signed)
I have spoke with the mother and she states that Hannah Shaw has fallen and done something to her back.  She wanted to CXL the PFT appt for now and will call us back when they can get the PFT rescheduled

## 2020-10-23 ENCOUNTER — Other Ambulatory Visit: Payer: Medicare Other

## 2020-10-24 ENCOUNTER — Ambulatory Visit: Payer: Medicare Other

## 2020-10-28 ENCOUNTER — Ambulatory Visit: Payer: Medicare Other | Admitting: Obstetrics & Gynecology

## 2020-12-05 ENCOUNTER — Telehealth: Payer: Self-pay | Admitting: Pulmonary Disease

## 2020-12-10 ENCOUNTER — Ambulatory Visit: Payer: Medicare Other | Admitting: Pulmonary Disease

## 2020-12-10 NOTE — Telephone Encounter (Signed)
No more PFT appts available for February we are waiting for them to give [place] more appts

## 2020-12-11 NOTE — Telephone Encounter (Signed)
Left message for them to call back and schedule PFTs since we now have appt dates and times for March 2022

## 2020-12-12 NOTE — Telephone Encounter (Signed)
I have spoke with Hannah Shaw and her PFT has been rescheduled for 12/25/20 @ 1:00pm arrival time 12:45pm. Patient states she was Covid positive 2 weeks ago

## 2020-12-24 ENCOUNTER — Other Ambulatory Visit: Payer: Medicare Other | Attending: Pulmonary Disease

## 2020-12-25 ENCOUNTER — Ambulatory Visit: Payer: Medicare Other | Attending: Pulmonary Disease

## 2020-12-25 ENCOUNTER — Other Ambulatory Visit: Payer: Self-pay

## 2020-12-25 DIAGNOSIS — R0602 Shortness of breath: Secondary | ICD-10-CM | POA: Diagnosis present

## 2020-12-25 MED ORDER — ALBUTEROL SULFATE (2.5 MG/3ML) 0.083% IN NEBU
2.5000 mg | INHALATION_SOLUTION | Freq: Once | RESPIRATORY_TRACT | Status: AC
  Filled 2020-12-25: qty 3

## 2021-01-14 ENCOUNTER — Ambulatory Visit: Payer: Medicare Other | Admitting: Pulmonary Disease

## 2021-02-14 ENCOUNTER — Encounter: Payer: Self-pay | Admitting: Pulmonary Disease

## 2021-02-28 ENCOUNTER — Ambulatory Visit: Payer: Medicare Other | Admitting: Pulmonary Disease

## 2021-03-06 ENCOUNTER — Other Ambulatory Visit: Payer: Self-pay

## 2021-03-06 ENCOUNTER — Ambulatory Visit (INDEPENDENT_AMBULATORY_CARE_PROVIDER_SITE_OTHER): Payer: Medicare Other | Admitting: Pulmonary Disease

## 2021-03-06 ENCOUNTER — Encounter: Payer: Self-pay | Admitting: Pulmonary Disease

## 2021-03-06 VITALS — BP 120/70 | HR 109 | Temp 98.4°F | Ht 60.0 in | Wt 97.6 lb

## 2021-03-06 DIAGNOSIS — J45998 Other asthma: Secondary | ICD-10-CM

## 2021-03-06 DIAGNOSIS — F819 Developmental disorder of scholastic skills, unspecified: Secondary | ICD-10-CM | POA: Diagnosis not present

## 2021-03-06 DIAGNOSIS — R0602 Shortness of breath: Secondary | ICD-10-CM | POA: Diagnosis not present

## 2021-03-06 DIAGNOSIS — F17210 Nicotine dependence, cigarettes, uncomplicated: Secondary | ICD-10-CM | POA: Diagnosis not present

## 2021-03-06 MED ORDER — TRELEGY ELLIPTA 100-62.5-25 MCG/INH IN AEPB: 1.0000 | INHALATION_SPRAY | Freq: Every day | RESPIRATORY_TRACT | 0 refills | Status: DC

## 2021-03-06 MED ORDER — TRELEGY ELLIPTA 100-62.5-25 MCG/INH IN AEPB: 1.0000 | INHALATION_SPRAY | Freq: Every day | RESPIRATORY_TRACT | 11 refills | Status: AC

## 2021-03-06 NOTE — Patient Instructions (Signed)
It is very important that you quit smoking.  We gave you samples of Trelegy 1 puff daily make sure you rinse your mouth well after you use it.  We sent a prescription of Trelegy to [facility].  We will see you in follow-up in 4 months time call sooner should any new problems arise.

## 2021-03-06 NOTE — Progress Notes (Signed)
Subjective:    Patient ID: Hannah Shaw, female    DOB: [birth year], 25 y.o.   MRN: [id number]  Chief Complaint  Patient presents with  . Follow-up    C/o prod cough with clear sputum, sob with exertion and wheezing.     HPI Hannah Shaw is a 25 year old current smoker (half PPD) who presents for follow-up on the issue of persistent asthma of undetermined severity, likely moderate.  She was initially seen on 09 October 2020 started on Trelegy Ellipta and she felt that this medication helped her quite a bit.  However she has not had the medication filled since she got her initial samples.  T was attempted on 25 December 2020 however the patient was unable to comprehend the instructions and was unable to complete or perform the test.  She does have issues with developmental delay and learning disability.  She unfortunately continues to smoke half pack of cigarettes per day.  But overall she tells me that when she was on Trelegy that was "magic".  She has not had any fevers, chills or sweats.  No chest pain, no palpitations.  No orthopnea or paroxysmal nocturnal dyspnea.  No lower extremity edema or calf tenderness.  She presents with her father today.    Review of Systems A 10 point review of systems was performed and it is as noted above otherwise negative.  Patient Active Problem List   Diagnosis Date Noted  . Generalized abdominal pain 04/16/2020  . 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 Chronic pelvic pain in female 06/13/2019  . 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 Hypothyroidism 01/17/2019  . Learning disability 01/17/2019  . 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 Depressive disorder 01/12/2018  . Anxiety 08/27/2014  . Constipation 08/27/2014  . Endometriosis 08/27/2014  . Mild intermittent asthma 08/27/2014  . S/P appendectomy 06/08/2013   Social History   Tobacco Use  . Smoking status: Current Every Day Smoker    Packs/day: 1.00    Years: 3.00     Pack years: 3.00    Types: Cigarettes  . Smokeless tobacco: Never Used  . Tobacco comment: 0.5PPD 03/06/2021  Substance Use Topics  . Alcohol use: No   Allergies  Allergen Reactions  . Morphine And Related Hives and Shortness Of Breath  . Amitriptyline Other (See Comments)    Leg Pain  . Fentanyl     PT DENIES THIS ALLERGY DURING PHONE INTERVIEW ON 03-05-16  . Quetiapine Other (See Comments)    Really Bad shakes and joint pain.  . Sertraline     Other reaction(s): Vomiting  . Betadine [Povidone Iodine] Rash  . Iodine Rash   Current Meds  Medication Sig  . albuterol (VENTOLIN HFA) 108 (90 Base) MCG/ACT inhaler Inhale 2 puffs into the lungs every 4 (four) hours as needed for wheezing or shortness of breath.  . cyanocobalamin (,VITAMIN B-12,) 1000 MCG/ML injection Inject into the muscle.  . diclofenac Sodium (VOLTAREN) 1 % GEL   . Fluoxetine HCl, PMDD, 10 MG TABS Take by mouth.  . Fluticasone-Umeclidin-Vilant (TRELEGY ELLIPTA) 100-62.5-25 MCG/INH AEPB Inhale 1 puff into the lungs daily for 1 day.  . Fluticasone-Umeclidin-Vilant (TRELEGY ELLIPTA) 100-62.5-25 MCG/INH AEPB Inhale 1 puff into the lungs daily.  Marland Kitchen ibuprofen (ADVIL) 600 MG tablet Take 600 mg by mouth every 6 (six) hours as needed.  Marland Kitchen levothyroxine (SYNTHROID, LEVOTHROID) 25 MCG tablet Take 25 mcg by mouth daily before breakfast.   . naproxen (NAPROSYN) 500 MG tablet Take  1 tablet (500 mg total) by mouth 2 (two) times daily with a meal.  . norelgestromin-ethinyl estradiol Marilu Favre) 150-35 MCG/24HR transdermal patch Place 1 patch onto the skin once a week. One patch weekly for 3 weeks, then one week off.  . ondansetron (ZOFRAN ODT) 4 MG disintegrating tablet Take 1 tablet (4 mg total) by mouth every 8 (eight) hours as needed for nausea or vomiting.  . pantoprazole (PROTONIX) 40 MG tablet Take 40 mg by mouth daily.  . prazosin (MINIPRESS) 1 MG capsule Take 1 mg by mouth 2 (two) times daily.  . Vitamin D, Ergocalciferol,  (DRISDOL) 1.25 MG (50000 UNIT) CAPS capsule    Patient was not taking Trelegy at the time of the visit.  She has changed pharmacies.  We will need to send to [place].  Immunization History  Administered Date(s) Administered  . HPV Quadrivalent 06/15/2008, 10/01/2008, 06/27/2009  . Hepatitis A, Ped/Adol-2 Dose 06/15/2008, 06/27/2009  . Influenza Split 09/21/2011, 07/28/2012  . Influenza, Seasonal, Injecte, Preservative Fre 10/01/2008, 08/08/2019  . Influenza,inj,Quad PF,6+ Mos 08/01/2013, 08/27/2014, 01/17/2019  . Influenza-Unspecified 01/17/2019  . Meningococcal Conjugate 06/27/2009  . Tdap 06/15/2008, 01/17/2019  . Varicella 06/15/2008        Objective:   Physical Exam BP 120/70 (BP Location: Left Arm, Cuff Size: Normal)   Pulse (!) 109   Temp 98.4 F (36.9 C) (Temporal)   Ht 5' (1.524 m)   Wt 97 lb 9.6 oz (44.3 kg)   SpO2 97%   BMI 19.06 kg/m   GENERAL: Thin, slightly built young woman, no acute distress.  No conversational dyspnea.  Fully ambulatory. HEAD: Normocephalic, atraumatic.  EYES: Pupils equal, round, reactive to light.  No scleral icterus.  MOUTH: Nose/mouth/throat not examined due to masking requirements for COVID 19. NECK: Supple. No thyromegaly. Trachea midline. No JVD.  No adenopathy. PULMONARY: Good air entry bilaterally.  Coarse otherwise, no adventitious sounds. CARDIOVASCULAR: S1 and S2. Regular rate and rhythm.  ABDOMEN: Benign. MUSCULOSKELETAL: No joint deformity, no clubbing, no edema.  NEUROLOGIC: No overt focal deficit.  No overt gait disturbance. SKIN: Intact,warm,dry. PSYCH: Easily distracted.  Poor attention span.     Assessment & Plan:     ICD-10-CM   1. Persistent asthma with undetermined severity - suspect moderate  J45.998    Patient unable to perform PFTs Suspect degree is moderate Recommend tobacco cessation Resume Trelegy 100/62.5/25 1 puff daily  2. Shortness of breath  R06.02    She noted better on  Trelegy Symptom recurred off of Trelegy  3. Tobacco dependence due to cigarettes  F17.210    Patient counseled regards to discontinuation of smoking Total counseling time 3 to 5 minutes Unsure how much patient is able to comprehend  4. Cognitive developmental delay  F81.9    This issue adds complexity to her management   Meds ordered this encounter  Medications  . DISCONTD: Fluticasone-Umeclidin-Vilant (TRELEGY ELLIPTA) 100-62.5-25 MCG/INH AEPB    Sig: Inhale 1 puff into the lungs daily for 1 day.    Dispense:  14 each    Refill:  0    Order Specific Question:   Lot Number?    Answer:   [id number]    Order Specific Question:   Expiration Date?    Answer:   10/26/2022    Order Specific Question:   Manufacturer?    Answer:   Gl[employer] [[id number]]    Order Specific Question:   Quantity    Answer:   2  . Fluticasone-Umeclidin-Vilant (TRELEGY ELLIPTA)  100-62.5-25 MCG/INH AEPB    Sig: Inhale 1 puff into the lungs daily for 1 day.    Dispense:  28 each    Refill:  11    Order Specific Question:   Lot Number?    Answer:   [id number]    Order Specific Question:   Expiration Date?    Answer:   10/26/2022    Order Specific Question:   Manufacturer?    Answer:   Gl[employer] [[id number]]    Order Specific Question:   Quantity    Answer:   2   Patient will resume Trelegy.  She was counseled again with regards to discontinuation of smoking.  Father participated in the counseling.  We will see her in follow-up in 4 months time he is to contact us prior to that time should any new difficulties arise.  Renold Don, MD [facility]   *This note was dictated using voice recognition software/Dragon.  Despite best efforts to proofread, errors can occur which can change the meaning.  Any change was purely unintentional.

## 2021-03-09 ENCOUNTER — Emergency Department: Payer: Medicare Other

## 2021-03-09 ENCOUNTER — Emergency Department
Admission: EM | Admit: 2021-03-09 | Discharge: 2021-03-09 | Disposition: A | Discharge status: Home / Self Care | Payer: Medicare Other | Attending: Emergency Medicine | Admitting: Emergency Medicine

## 2021-03-09 ENCOUNTER — Other Ambulatory Visit: Payer: Self-pay

## 2021-03-09 DIAGNOSIS — F17210 Nicotine dependence, cigarettes, uncomplicated: Secondary | ICD-10-CM | POA: Insufficient documentation

## 2021-03-09 DIAGNOSIS — Z79899 Other long term (current) drug therapy: Secondary | ICD-10-CM | POA: Insufficient documentation

## 2021-03-09 DIAGNOSIS — Z7952 Long term (current) use of systemic steroids: Secondary | ICD-10-CM | POA: Insufficient documentation

## 2021-03-09 DIAGNOSIS — R110 Nausea: Secondary | ICD-10-CM | POA: Insufficient documentation

## 2021-03-09 DIAGNOSIS — N189 Chronic kidney disease, unspecified: Secondary | ICD-10-CM | POA: Diagnosis not present

## 2021-03-09 DIAGNOSIS — Z8719 Personal history of other diseases of the digestive system: Secondary | ICD-10-CM | POA: Diagnosis not present

## 2021-03-09 DIAGNOSIS — R000 Tachycardia, unspecified: Secondary | ICD-10-CM | POA: Diagnosis not present

## 2021-03-09 DIAGNOSIS — E039 Hypothyroidism, unspecified: Secondary | ICD-10-CM | POA: Insufficient documentation

## 2021-03-09 DIAGNOSIS — R1011 Right upper quadrant pain: Secondary | ICD-10-CM | POA: Diagnosis present

## 2021-03-09 DIAGNOSIS — J4520 Mild intermittent asthma, uncomplicated: Secondary | ICD-10-CM | POA: Insufficient documentation

## 2021-03-09 LAB — CBC
HCT: 42.4 % (ref 36.0–46.0)
Hemoglobin: 15.5 g/dL — ABNORMAL HIGH (ref 12.0–15.0)
MCH: 33.2 pg (ref 26.0–34.0)
MCHC: 36.6 g/dL — ABNORMAL HIGH (ref 30.0–36.0)
MCV: 90.8 fL (ref 80.0–100.0)
Platelets: 224 10*3/uL (ref 150–400)
RBC: 4.67 MIL/uL (ref 3.87–5.11)
RDW: 10.9 % — ABNORMAL LOW (ref 11.5–15.5)
WBC: 6.6 10*3/uL (ref 4.0–10.5)
nRBC: 0 % (ref 0.0–0.2)

## 2021-03-09 LAB — COMPREHENSIVE METABOLIC PANEL
ALT: 18 U/L (ref 0–44)
AST: 22 U/L (ref 15–41)
Albumin: 4.6 g/dL (ref 3.5–5.0)
Alkaline Phosphatase: 41 U/L (ref 38–126)
Anion gap: 10 (ref 5–15)
BUN: 8 mg/dL (ref 6–20)
CO2: 22 mmol/L (ref 22–32)
Calcium: 9 mg/dL (ref 8.9–10.3)
Chloride: 104 mmol/L (ref 98–111)
Creatinine, Ser: 0.61 mg/dL (ref 0.44–1.00)
GFR, Estimated: >60 mL/min (ref >60)
Glucose, Bld: 85 mg/dL (ref 70–99)
Potassium: 3.2 mmol/L — ABNORMAL LOW (ref 3.5–5.1)
Sodium: 136 mmol/L (ref 135–145)
Total Bilirubin: 1.3 mg/dL — ABNORMAL HIGH (ref 0.3–1.2)
Total Protein: 7.9 g/dL (ref 6.5–8.1)

## 2021-03-09 LAB — POC URINE PREG, ED: Preg Test, Ur: NEGATIVE

## 2021-03-09 LAB — LIPASE, BLOOD: Lipase: 26 U/L (ref 11–51)

## 2021-03-09 MED ORDER — KETOROLAC TROMETHAMINE 30 MG/ML IJ SOLN
30.0000 mg | Freq: Once | INTRAMUSCULAR | Status: AC
  Administered 2021-03-09: 30 mg via INTRAVENOUS
  Filled 2021-03-09: qty 1

## 2021-03-09 MED ORDER — OXYCODONE-ACETAMINOPHEN 5-325 MG PO TABS
1.0000 | ORAL_TABLET | Freq: Once | ORAL | Status: AC
  Administered 2021-03-09: 1 via ORAL
  Filled 2021-03-09: qty 1

## 2021-03-09 NOTE — ED Notes (Signed)
Dr Kinner at bedside. 

## 2021-03-09 NOTE — ED Triage Notes (Signed)
Pt presents via EMS for c/o RUQ abdominal pain x 1 day with 1 episode of vomiting today. Pt reports pain is 10/10 and took Percocet 5 x 1 at home PTA.  Temp from EMS was 100.3F.  Pt reports hx of endometriosis. Denies vaginal bleeding or discharge.

## 2021-03-09 NOTE — ED Provider Notes (Signed)
[facility] Emergency Department Provider Note   ____________________________________________    I have reviewed the triage vital signs and the nursing notes.   HISTORY  Chief Complaint Right upper quadrant abdominal pain    HPI Hannah Shaw is a 25 y.o. female who presents with complaints of abdominal pain.  Patient reports that she has a history of endometriosis and complains of right-sided abdominal pain.  She reports has been ongoing for over a day but worsened today.  Denies fevers or chills.  Mild nausea no vomiting.  No history of biliary colic.  Was given p.o. Percocet per EMS  Past Medical History:  Diagnosis Date  . Anxiety   . Asthma    WELL CONTROLLED  . Chronic kidney disease    H/O STONES  . Endometriosis   . GERD (gastroesophageal reflux disease)   . Headache   . History of kidney stones   . History of ovarian cyst   . Hypothyroidism   . Miscarriage   . Thyroid disease     Patient Active Problem List   Diagnosis Date Noted  . Generalized abdominal pain 04/16/2020  . 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 Chronic pelvic pain in female 06/13/2019  . 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 Hypothyroidism 01/17/2019  . Learning disability 01/17/2019  . 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 Depressive disorder 01/12/2018  . Anxiety 08/27/2014  . Constipation 08/27/2014  . Endometriosis 08/27/2014  . Mild intermittent asthma 08/27/2014  . S/P appendectomy 06/08/2013    Past Surgical History:  Procedure Laterality Date  . APPENDECTOMY    . COLONOSCOPY WITH PROPOFOL N/A 09/08/2019   Procedure: COLONOSCOPY WITH PROPOFOL;  Surgeon:  Bellow, MD;  Location: [facility] ENDOSCOPY;  Service: Endoscopy;  Laterality: N/A;  . DIAGNOSTIC LAPAROSCOPY    . ENDOMETRIAL BIOPSY    . ESOPHAGOGASTRODUODENOSCOPY (EGD) WITH PROPOFOL N/A 09/08/2019   Procedure: ESOPHAGOGASTRODUODENOSCOPY (EGD) WITH  PROPOFOL;  Surgeon:  Bellow, MD;  Location: [facility] ENDOSCOPY;  Service: Endoscopy;  Laterality: N/A;  . LAPAROSCOPY N/A 03/13/2016   Procedure: LAPAROSCOPY DIAGNOSTIC;  Surgeon: Honor Loh Ward, MD;  Location: [facility] ORS;  Service: Gynecology;  Laterality: N/A;    Prior to Admission medications   Medication Sig Start Date End Date Taking? Authorizing Provider  albuterol (VENTOLIN HFA) 108 (90 Base) MCG/ACT inhaler Inhale 2 puffs into the lungs every 4 (four) hours as needed for wheezing or shortness of breath. 05/14/19   Paulette Blanch, MD  cyanocobalamin (,VITAMIN B-12,) 1000 MCG/ML injection Inject into the muscle. 11/22/19   [provider]  diclofenac Sodium (VOLTAREN) 1 % GEL  02/22/20   [provider]  dicyclomine (BENTYL) 20 MG tablet Take 1 tablet (20 mg total) by mouth 3 (three) times daily before meals for 7 days. 01/28/20 02/04/20  Lilia Pro., MD  Fluoxetine HCl, PMDD, 10 MG TABS Take by mouth.    [provider]  Fluticasone-Umeclidin-Vilant (TRELEGY ELLIPTA) 100-62.5-25 MCG/INH AEPB Inhale 1 puff into the lungs daily.    [provider]  Fluticasone-Umeclidin-Vilant (TRELEGY ELLIPTA) 100-62.5-25 MCG/INH AEPB Inhale 1 puff into the lungs daily for 1 day. 03/25/21 03/26/21  Tyler Pita, MD  ibuprofen (ADVIL) 600 MG tablet Take 600 mg by mouth every 6 (six) hours as needed. 02/10/20   [provider]  levothyroxine (SYNTHROID, LEVOTHROID) 25 MCG tablet Take 25 mcg by mouth daily before breakfast.     [provider]  naproxen (NAPROSYN)  500 MG tablet Take 1 tablet (500 mg total) by mouth 2 (two) times daily with a meal. 02/06/20   Sable Feil, PA-C  norelgestromin-ethinyl estradiol Marilu Favre) 150-35 MCG/24HR transdermal patch Place 1 patch onto the skin once a week. One patch weekly for 3 weeks, then one week off. 08/06/20   Gae Dry, MD  ondansetron (ZOFRAN ODT) 4 MG disintegrating tablet Take 1 tablet (4 mg total) by  mouth every 8 (eight) hours as needed for nausea or vomiting. 07/26/20   Harvest Dark, MD  oxyCODONE-acetaminophen (PERCOCET/ROXICET) 5-325 MG tablet Take 1 tablet by mouth 3 (three) times daily as needed. 02/12/21   [provider]  pantoprazole (PROTONIX) 40 MG tablet Take 40 mg by mouth daily. 10/03/19   [provider]  prazosin (MINIPRESS) 1 MG capsule Take 1 mg by mouth 2 (two) times daily. 06/08/19   [provider]  Vitamin D, Ergocalciferol, (DRISDOL) 1.25 MG (50000 UNIT) CAPS capsule  03/04/20   [provider]     Allergies Morphine and related, Amitriptyline, Fentanyl, Quetiapine, Sertraline, Betadine [povidone iodine], and Iodine  Family History  Problem Relation Age of Onset  . Cervical cancer Mother   . Lung cancer Paternal Uncle     Social History Social History   Tobacco Use  . Smoking status: Current Every Day Smoker    Packs/day: 1.00    Years: 3.00    Pack years: 3.00    Types: Cigarettes  . Smokeless tobacco: Never Used  . Tobacco comment: 0.5PPD 03/06/2021  Vaping Use  . Vaping Use: Former  Substance Use Topics  . Alcohol use: No  . Drug use: Never    Review of Systems  Constitutional: No fever/chills Eyes: No visual changes.  ENT: No sore throat. Cardiovascular: Denies chest pain. Respiratory: Denies shortness of breath. Gastrointestinal: As above Genitourinary: Negative for dysuria. Musculoskeletal: Negative for back pain. Skin: Negative for rash. Neurological: Negative for headaches    ____________________________________________   PHYSICAL EXAM:  VITAL SIGNS: ED Triage Vitals [03/09/21 1609]  Enc Vitals Group     BP (!) 127/92     Pulse Rate (!) 105     Resp 18     Temp 98.7 F (37.1 C)     Temp Source Oral     SpO2 100 %     Weight      Height      Head Circumference      Peak Flow      Pain Score      Pain Loc      Pain Edu?      Excl. in [place]?     Constitutional: Alert and  oriented.   Nose: No congestion/rhinnorhea. Mouth/Throat: Mucous membranes are moist.   Neck:  Painless ROM Cardiovascular: Normal rate, regular rhythm. Grossly normal heart sounds.  Good peripheral circulation. Respiratory: Normal respiratory effort.  No retractions. Lungs CTAB. Gastrointestinal: Soft, mild tenderness right upper quadrant, no distention  Musculoskeletal:  Warm and well perfused Neurologic:  Normal speech and language. No gross focal neurologic deficits are appreciated.  Skin:  Skin is warm, dry and intact. No rash noted. Psychiatric: Mood and affect are normal. Speech and behavior are normal.  ____________________________________________   LABS (all labs ordered are listed, but only abnormal results are displayed)  Labs Reviewed  CBC - Abnormal; Notable for the following components:      Result Value   Hemoglobin 15.5 (*)    MCHC 36.6 (*)    RDW  10.9 (*)    All other components within normal limits  COMPREHENSIVE METABOLIC PANEL - Abnormal; Notable for the following components:   Potassium 3.2 (*)    Total Bilirubin 1.3 (*)    All other components within normal limits  LIPASE, BLOOD  POC URINE PREG, ED   ____________________________________________  EKG  None ____________________________________________  RADIOLOGY  Ultrasound right upper quadrant reviewed by me, no acute abnormality CT abdomen pelvis ____________________________________________   PROCEDURES  Procedure(s) performed: No  Procedures   Critical Care performed: No ____________________________________________   INITIAL IMPRESSION / ASSESSMENT AND PLAN / ED COURSE  Pertinent labs & imaging results that were available during my care of the patient were reviewed by me and considered in my medical decision making (see chart for details).  Patient presents with right-sided abdominal pain as described above, reports a history of endometriosis and the certainly may be related to  that pain.  She is mildly tachycardic, mild tenderness in the right upper quadrant.  Will obtain ultrasound, labs.  Will treat with Toradol given her allergies.  She reports no concerns about pregnancy.  Ultrasound is overall reassuring, patient still complaining of pain, will proceed with CT given that she is crying out in pain.  Have reviewed medical records and the patient does have a history of frequent presentation to the emergency department for chronic abdominal pain however she states today's is different hence we will proceed with imaging  CT is reassuring, patient is feeling greatly improved, no further pain, tolerating p.o.'s, proper for discharge at this time      ____________________________________________   FINAL CLINICAL IMPRESSION(S) / ED DIAGNOSES  Final diagnoses:  Right upper quadrant abdominal pain        Note:  This document was prepared using Dragon voice recognition software and may include unintentional dictation errors.   Lavonia Drafts, MD 03/09/21 1924

## 2021-03-09 NOTE — ED Notes (Signed)
Patient crying and yelling, states 10/10 pain, MD notified.

## 2021-03-09 NOTE — ED Notes (Signed)
Pt reports pain is improved post medication. Pt having dry heaves without vomiting and sts it is due to taking medication on an empty stomach. Pt advised that due to pending CT she is not able to eat/drink at this time. Pt verbalized understanding. Dr Corky Downs notified.

## 2021-03-17 ENCOUNTER — Ambulatory Visit: Payer: Medicare Other | Admitting: Obstetrics & Gynecology

## 2021-03-21 ENCOUNTER — Encounter: Payer: Self-pay | Admitting: Obstetrics & Gynecology

## 2021-03-21 ENCOUNTER — Ambulatory Visit (INDEPENDENT_AMBULATORY_CARE_PROVIDER_SITE_OTHER): Payer: Medicare Other | Admitting: Obstetrics & Gynecology

## 2021-03-21 ENCOUNTER — Other Ambulatory Visit (HOSPITAL_COMMUNITY)
Admission: RE | Admit: 2021-03-21 | Discharge: 2021-03-21 | Disposition: A | Discharge status: Home / Self Care | Payer: Medicare Other | Source: Ambulatory Visit | Attending: Obstetrics & Gynecology | Admitting: Obstetrics & Gynecology

## 2021-03-21 ENCOUNTER — Other Ambulatory Visit: Payer: Self-pay

## 2021-03-21 VITALS — BP 120/80 | Ht 62.0 in | Wt 96.0 lb

## 2021-03-21 DIAGNOSIS — Z124 Encounter for screening for malignant neoplasm of cervix: Secondary | ICD-10-CM | POA: Insufficient documentation

## 2021-03-21 DIAGNOSIS — R102 Pelvic and perineal pain: Secondary | ICD-10-CM

## 2021-03-21 DIAGNOSIS — G8929 Other chronic pain: Secondary | ICD-10-CM

## 2021-03-21 NOTE — Progress Notes (Signed)
Gynecology Pelvic Pain Evaluation   Chief Complaint: chronic pain  History of Present Illness:   Patient is a 25 y.o. G1P0010 who LMP was Patient's last menstrual period was 03/18/2021., presents today for a problem visit.  She complains of pain.   Her pain is localized to the suprapubic, periumbilical and deep pelvis area, described as constant, began several years ago and its severity is described as severe. The pain radiates to the  Non-radiating. She has these associated symptoms which include fatigue and depression. Patient has these modifiers which include relaxation and pain medication that make it better and unable to associate with any factor that make it worse.  Recent CT normal (GYN, may have other findings non-specific).  She has been seen by many for her pain without finding relief.  She is having reg cycles off of hormones, no issues w periods.  She is not currently sexually active.  PMHx: She  has a past medical history of Anxiety, Asthma, Chronic kidney disease, Endometriosis, GERD (gastroesophageal reflux disease), Headache, History of kidney stones, History of ovarian cyst, Hypothyroidism, Miscarriage, and Thyroid disease. Also,  has a past surgical history that includes Appendectomy; Endometrial biopsy; laparoscopy (N/A, 03/13/2016); Diagnostic laparoscopy; Colonoscopy with propofol (N/A, 09/08/2019); and Esophagogastroduodenoscopy (egd) with propofol (N/A, 09/08/2019)., family history includes Cervical cancer in her mother; Lung cancer in her paternal uncle.,  reports that she has been smoking cigarettes. She has a 3.00 pack-year smoking history. She has never used smokeless tobacco. She reports that she does not drink alcohol and does not use drugs.  She has a current medication list which includes the following prescription(s): albuterol, cyanocobalamin, diclofenac sodium, fluoxetine hcl (pmdd), trelegy ellipta, [START ON 03/25/2021] trelegy ellipta, gabapentin, ibuprofen,  levothyroxine, naproxen, xulane, ondansetron, oxycodone-acetaminophen, pantoprazole, prazosin, vitamin d (ergocalciferol), and dicyclomine, and the following Facility-Administered Medications: albuterol. Also, is allergic to morphine and related, amitriptyline, fentanyl, quetiapine, sertraline, betadine [povidone iodine], and iodine.  Review of Systems  Constitutional: Negative for chills, fever and malaise/fatigue.  HENT: Negative for congestion, sinus pain and sore throat.   Eyes: Negative for blurred vision and pain.  Respiratory: Negative for cough and wheezing.   Cardiovascular: Negative for chest pain and leg swelling.  Gastrointestinal: Positive for abdominal pain. Negative for constipation, diarrhea, heartburn, nausea and vomiting.  Genitourinary: Negative for dysuria, frequency, hematuria and urgency.  Musculoskeletal: Negative for back pain, joint pain, myalgias and neck pain.  Skin: Negative for itching and rash.  Neurological: Negative for dizziness, tremors and weakness.  Endo/Heme/Allergies: Does not bruise/bleed easily.  Psychiatric/Behavioral: Negative for depression. The patient is not nervous/anxious and does not have insomnia.     Objective: BP 120/80   Ht 5\' 2"  (1.575 m)   Wt 96 lb (43.5 kg)   LMP 03/18/2021   BMI 17.56 kg/m  Physical Exam Constitutional:      General: She is not in acute distress.    Appearance: She is well-developed.  Genitourinary:     Bladder normal.     No vaginal erythema or bleeding.      Right Adnexa: not tender and no mass present.    Left Adnexa: not tender and no mass present.    No cervical motion tenderness, discharge, polyp or nabothian cyst.     Uterus is not enlarged or tender.     No uterine mass detected.    Uterus exam comments: Min T on bimual exam.     Uterus is midaxial.     Pelvic exam was performed  with patient in the lithotomy position.  HENT:     Head: Normocephalic and atraumatic.     Nose: Nose normal.   Abdominal:     General: There is no distension.     Palpations: Abdomen is soft.     Tenderness: There is generalized abdominal tenderness.  Musculoskeletal:        General: Normal range of motion.  Neurological:     Mental Status: She is alert and oriented to person, place, and time.     Cranial Nerves: No cranial nerve deficit.  Skin:    General: Skin is warm and dry.  Psychiatric:        Attention and Perception: Attention normal.        Mood and Affect: Mood and affect normal.        Speech: Speech normal.        Behavior: Behavior normal.        Thought Content: Thought content normal.        Judgment: Judgment normal.     Female chaperone present for pelvic portion of the physical exam  Assessment: 25 y.o. G1P0010 1. Chronic pelvic pain in female - Ambulatory referral to Pain Clinic  2. Screening for cervical cancer - Cytology - PAP  Referral and help w finding etiology and treatment No Gyn source likely    Not c/w endometriosis    Normal CT recently and [place] in past  A total of 32 minutes were spent face-to-face with the patient as well as preparation, review, communication, and documentation during this encounter.   Barnett Applebaum, MD, [facility], [facility] 03/21/2021  3:41 PM

## 2021-03-26 ENCOUNTER — Other Ambulatory Visit: Payer: Self-pay | Admitting: Obstetrics & Gynecology

## 2021-03-26 DIAGNOSIS — R102 Pelvic and perineal pain: Secondary | ICD-10-CM

## 2021-03-26 DIAGNOSIS — G8929 Other chronic pain: Secondary | ICD-10-CM

## 2021-03-26 LAB — CYTOLOGY - PAP
Chlamydia: NEGATIVE
Comment: NEGATIVE
Comment: NORMAL
Diagnosis: NEGATIVE
Neisseria Gonorrhea: NEGATIVE

## 2021-05-22 ENCOUNTER — Emergency Department: Payer: Medicare Other

## 2021-05-22 ENCOUNTER — Emergency Department
Admission: EM | Admit: 2021-05-22 | Discharge: 2021-05-22 | Disposition: A | Discharge status: Home / Self Care | Payer: Medicare Other | Attending: Emergency Medicine | Admitting: Emergency Medicine

## 2021-05-22 ENCOUNTER — Other Ambulatory Visit: Payer: Self-pay

## 2021-05-22 DIAGNOSIS — E039 Hypothyroidism, unspecified: Secondary | ICD-10-CM | POA: Insufficient documentation

## 2021-05-22 DIAGNOSIS — F17210 Nicotine dependence, cigarettes, uncomplicated: Secondary | ICD-10-CM | POA: Diagnosis not present

## 2021-05-22 DIAGNOSIS — Z79899 Other long term (current) drug therapy: Secondary | ICD-10-CM | POA: Diagnosis not present

## 2021-05-22 DIAGNOSIS — J4520 Mild intermittent asthma, uncomplicated: Secondary | ICD-10-CM | POA: Diagnosis not present

## 2021-05-22 DIAGNOSIS — N189 Chronic kidney disease, unspecified: Secondary | ICD-10-CM | POA: Diagnosis not present

## 2021-05-22 DIAGNOSIS — R55 Syncope and collapse: Secondary | ICD-10-CM | POA: Insufficient documentation

## 2021-05-22 DIAGNOSIS — E860 Dehydration: Secondary | ICD-10-CM | POA: Diagnosis not present

## 2021-05-22 DIAGNOSIS — R1110 Vomiting, unspecified: Secondary | ICD-10-CM

## 2021-05-22 DIAGNOSIS — Z7951 Long term (current) use of inhaled steroids: Secondary | ICD-10-CM | POA: Diagnosis not present

## 2021-05-22 DIAGNOSIS — R112 Nausea with vomiting, unspecified: Secondary | ICD-10-CM | POA: Diagnosis not present

## 2021-05-22 LAB — CBC
HCT: 43.5 % (ref 36.0–46.0)
Hemoglobin: 16 g/dL — ABNORMAL HIGH (ref 12.0–15.0)
MCH: 33.9 pg (ref 26.0–34.0)
MCHC: 36.8 g/dL — ABNORMAL HIGH (ref 30.0–36.0)
MCV: 92.2 fL (ref 80.0–100.0)
Platelets: 232 10*3/uL (ref 150–400)
RBC: 4.72 MIL/uL (ref 3.87–5.11)
RDW: 11.1 % — ABNORMAL LOW (ref 11.5–15.5)
WBC: 5.4 10*3/uL (ref 4.0–10.5)
nRBC: 0 % (ref 0.0–0.2)

## 2021-05-22 LAB — URINE DRUG SCREEN, QUALITATIVE (ARMC ONLY)
Amphetamines, Ur Screen: NOT DETECTED
Barbiturates, Ur Screen: NOT DETECTED
Benzodiazepine, Ur Scrn: POSITIVE — AB
Cannabinoid 50 Ng, Ur ~~LOC~~: NOT DETECTED
Cocaine Metabolite,Ur ~~LOC~~: NOT DETECTED
MDMA (Ecstasy)Ur Screen: NOT DETECTED
Methadone Scn, Ur: NOT DETECTED
Opiate, Ur Screen: NOT DETECTED
Phencyclidine (PCP) Ur S: NOT DETECTED
Tricyclic, Ur Screen: NOT DETECTED

## 2021-05-22 LAB — BASIC METABOLIC PANEL
Anion gap: 9 (ref 5–15)
BUN: 5 mg/dL — ABNORMAL LOW (ref 6–20)
CO2: 28 mmol/L (ref 22–32)
Calcium: 9.5 mg/dL (ref 8.9–10.3)
Chloride: 103 mmol/L (ref 98–111)
Creatinine, Ser: 0.58 mg/dL (ref 0.44–1.00)
GFR, Estimated: >60 mL/min (ref >60)
Glucose, Bld: 99 mg/dL (ref 70–99)
Potassium: 3.6 mmol/L (ref 3.5–5.1)
Sodium: 140 mmol/L (ref 135–145)

## 2021-05-22 LAB — URINALYSIS, COMPLETE (UACMP) WITH MICROSCOPIC
Bacteria, UA: NONE SEEN
Bilirubin Urine: NEGATIVE
Glucose, UA: NEGATIVE mg/dL
Ketones, ur: NEGATIVE mg/dL
Nitrite: NEGATIVE
Protein, ur: NEGATIVE mg/dL
Specific Gravity, Urine: 1.015 (ref 1.005–1.030)
pH: 5 (ref 5.0–8.0)

## 2021-05-22 LAB — TROPONIN I (HIGH SENSITIVITY)
Troponin I (High Sensitivity): 3 ng/L (ref <18)
Troponin I (High Sensitivity): 3 ng/L (ref <18)

## 2021-05-22 LAB — POC URINE PREG, ED: Preg Test, Ur: NEGATIVE

## 2021-05-22 LAB — D-DIMER, QUANTITATIVE: D-Dimer, Quant: 0.41 ug/mL-FEU (ref 0.00–0.50)

## 2021-05-22 MED ORDER — ONDANSETRON 4 MG PO TBDP: 4.0000 mg | ORAL_TABLET | Freq: Three times a day (TID) | ORAL | 0 refills | Status: DC | PRN

## 2021-05-22 NOTE — ED Provider Notes (Signed)
[facility] Emergency Department Provider Note   ____________________________________________   Event Date/Time   First MD Initiated Contact with Patient 05/22/21 1800     (approximate)  I have reviewed the triage vital signs and the nursing notes.   HISTORY  Chief Complaint Loss of Consciousness    HPI Hannah Shaw is a 25 y.o. female with below stated past medical history presents for 2 episodes of syncope over the past 2 weeks  LOCATION: Generalized DURATION: 2 weeks prior to arrival TIMING: Intermittent SEVERITY: Severe QUALITY: Loss of consciousness CONTEXT: States that she has had poor p.o. intake over this time due to nausea and vomiting when she eats that has resulted in feelings of dehydration and orthostatic lightheadedness MODIFYING FACTORS: Standing from a seated position or lying position worsens the feelings of lightheadedness and can lead to syncope.  Patient denies any relieving factors ASSOCIATED SYMPTOMS: Nausea/vomiting postprandially   Per medical record review, patient has multiple significant past medical history including hypothyroidism, endometriosis, asthma          Past Medical History:  Diagnosis Date   Anxiety    Asthma    WELL CONTROLLED   Chronic kidney disease    H/O STONES   Endometriosis    GERD (gastroesophageal reflux disease)    Headache    History of kidney stones    History of ovarian cyst    Hypothyroidism    Miscarriage    Thyroid disease     Patient Active Problem List   Diagnosis Date Noted   Generalized abdominal pain 04/16/2020   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   Chronic pelvic pain in female 06/13/2019   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   Hypothyroidism 01/17/2019   Learning disability 01/17/2019   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   Depressive disorder 01/12/2018   Anxiety 08/27/2014   Constipation 08/27/2014   Endometriosis 08/27/2014    Mild intermittent asthma 08/27/2014   S/P appendectomy 06/08/2013    Past Surgical History:  Procedure Laterality Date   APPENDECTOMY     COLONOSCOPY WITH PROPOFOL N/A 09/08/2019   Procedure: COLONOSCOPY WITH PROPOFOL;  Surgeon: Robert Bellow, MD;  Location: [facility];  Service: Endoscopy;  Laterality: N/A;   DIAGNOSTIC LAPAROSCOPY     ENDOMETRIAL BIOPSY     ESOPHAGOGASTRODUODENOSCOPY (EGD) WITH PROPOFOL N/A 09/08/2019   Procedure: ESOPHAGOGASTRODUODENOSCOPY (EGD) WITH PROPOFOL;  Surgeon: Robert Bellow, MD;  Location: [facility] ENDOSCOPY;  Service: Endoscopy;  Laterality: N/A;   LAPAROSCOPY N/A 03/13/2016   Procedure: LAPAROSCOPY DIAGNOSTIC;  Surgeon: Honor Loh Ward, MD;  Location: [facility];  Service: Gynecology;  Laterality: N/A;    Prior to Admission medications   Medication Sig Start Date End Date Taking? Authorizing Provider  ondansetron (ZOFRAN ODT) 4 MG disintegrating tablet Take 1 tablet (4 mg total) by mouth every 8 (eight) hours as needed for nausea or vomiting. 05/22/21  Yes Rada Zegers, Vista Lawman, MD  albuterol (VENTOLIN HFA) 108 (90 Base) MCG/ACT inhaler Inhale 2 puffs into the lungs every 4 (four) hours as needed for wheezing or shortness of breath. 05/14/19   Paulette Blanch, MD  cyanocobalamin (,VITAMIN B-12,) 1000 MCG/ML injection Inject into the muscle. 11/22/19   [provider]  diclofenac Sodium (VOLTAREN) 1 % GEL  02/22/20   [provider]  dicyclomine (BENTYL) 20 MG tablet Take 1 tablet (20 mg total) by mouth 3 (three) times daily before meals for 7 days. 01/28/20 02/04/20  Lilia Pro., MD  Fluoxetine HCl, PMDD, 10 MG TABS Take by mouth.    [provider]  Fluticasone-Umeclidin-Vilant (TRELEGY ELLIPTA) 100-62.5-25 MCG/INH AEPB Inhale 1 puff into the lungs daily.    [provider]  gabapentin (NEURONTIN) 300 MG capsule Take 1 capsule by mouth 3 (three) times daily. 03/11/21   [provider]  ibuprofen (ADVIL) 600 MG tablet  Take 600 mg by mouth every 6 (six) hours as needed. 02/10/20   [provider]  levothyroxine (SYNTHROID, LEVOTHROID) 25 MCG tablet Take 25 mcg by mouth daily before breakfast.     [provider]  naproxen (NAPROSYN) 500 MG tablet Take 1 tablet (500 mg total) by mouth 2 (two) times daily with a meal. 02/06/20   Sable Feil, PA-C  norelgestromin-ethinyl estradiol Marilu Favre) 150-35 MCG/24HR transdermal patch Place 1 patch onto the skin once a week. One patch weekly for 3 weeks, then one week off. 08/06/20   Gae Dry, MD  oxyCODONE-acetaminophen (PERCOCET/ROXICET) 5-325 MG tablet Take 1 tablet by mouth 3 (three) times daily as needed. 02/12/21   [provider]  pantoprazole (PROTONIX) 40 MG tablet Take 40 mg by mouth daily. 10/03/19   [provider]  prazosin (MINIPRESS) 1 MG capsule Take 1 mg by mouth 2 (two) times daily. 06/08/19   [provider]  Vitamin D, Ergocalciferol, (DRISDOL) 1.25 MG (50000 UNIT) CAPS capsule  03/04/20   [provider]    Allergies Morphine and related, Amitriptyline, Fentanyl, Quetiapine, Sertraline, Betadine [povidone iodine], and Iodine  Family History  Problem Relation Age of Onset   Cervical cancer Mother    Lung cancer Paternal Uncle     Social History Social History   Tobacco Use   Smoking status: Every Day    Packs/day: 1.00    Years: 3.00    Pack years: 3.00    Types: Cigarettes   Smokeless tobacco: Never   Tobacco comments:    0.5PPD 03/06/2021  Vaping Use   Vaping Use: Former  Substance Use Topics   Alcohol use: No   Drug use: Never    Review of Systems Constitutional: No fever/chills.  Endorses syncope Eyes: No visual changes. ENT: No sore throat. Cardiovascular: Denies chest pain. Respiratory: Denies shortness of breath. Gastrointestinal: No abdominal pain.  Endorses nausea/vomiting intermittently.  No diarrhea. Genitourinary: Negative for dysuria. Musculoskeletal:  Negative for acute arthralgias Skin: Negative for rash. Neurological: Negative for headaches, weakness/numbness/paresthesias in any extremity Psychiatric: Negative for suicidal ideation/homicidal ideation   ____________________________________________   PHYSICAL EXAM:  VITAL SIGNS: ED Triage Vitals  Enc Vitals Group     BP 05/22/21 1503 (!) 125/99     Pulse Rate 05/22/21 1503 (!) 108     Resp 05/22/21 1503 20     Temp 05/22/21 1503 99.6 F (37.6 C)     Temp Source 05/22/21 1503 Oral     SpO2 05/22/21 1503 100 %     Weight 05/22/21 1504 89 lb (40.4 kg)     Height 05/22/21 1504 '5\' 2"'$  (1.575 m)     Head Circumference --      Peak Flow --      Pain Score 05/22/21 1504 3     Pain Loc --      Pain Edu? --      Excl. in [place]? --    Constitutional: Alert and oriented. Well appearing and in no acute distress. Eyes: Conjunctivae are normal. PERRL. Head: Atraumatic. Nose: No congestion/rhinnorhea. Mouth/Throat: Mucous membranes are moist. Neck: No stridor  Cardiovascular: Grossly normal heart sounds.  Good peripheral circulation. Respiratory: Normal respiratory effort.  No retractions. Gastrointestinal: Soft and nontender. No distention. Musculoskeletal: No obvious deformities Neurologic: Slightly slurred speech and normal language. No gross focal neurologic deficits are appreciated. Skin:  Skin is warm and dry. No rash noted. Psychiatric: Mood and affect are normal. Speech and behavior are normal.  ____________________________________________   LABS (all labs ordered are listed, but only abnormal results are displayed)  Labs Reviewed  BASIC METABOLIC PANEL - Abnormal; Notable for the following components:      Result Value   BUN 5 (*)    All other components within normal limits  CBC - Abnormal; Notable for the following components:   Hemoglobin 16.0 (*)    MCHC 36.8 (*)    RDW 11.1 (*)    All other components within normal limits  URINE DRUG SCREEN, QUALITATIVE ([facility]  ONLY) - Abnormal; Notable for the following components:   Benzodiazepine, Ur Scrn POSITIVE (*)    All other components within normal limits  URINALYSIS, COMPLETE (UACMP) WITH MICROSCOPIC - Abnormal; Notable for the following components:   Color, Urine YELLOW (*)    APPearance HAZY (*)    Hgb urine dipstick MODERATE (*)    Leukocytes,Ua TRACE (*)    All other components within normal limits  D-DIMER, QUANTITATIVE  POC URINE PREG, ED  TROPONIN I (HIGH SENSITIVITY)  TROPONIN I (HIGH SENSITIVITY)   ____________________________________________  EKG  ED ECG REPORT I, Naaman Plummer, the attending physician, personally viewed and interpreted this ECG.  Date: 05/22/2021 EKG Time: 1506 Rate: 107 Rhythm: Tachycardic sinus rhythm QRS Axis: normal Intervals: normal ST/T Wave abnormalities: normal Narrative Interpretation: no evidence of acute ischemia  ____________________________________________  RADIOLOGY  ED MD interpretation: 2 view chest x-ray shows no evidence of acute abnormalities including no pneumonia, pneumothorax, or widened mediastinum  Official radiology report(s): DG Chest 2 View  Result Date: 05/22/2021 CLINICAL DATA:  Chest pain. Referred from PCP for weight loss. Syncope. EXAM: CHEST - 2 VIEW COMPARISON:  Radiograph 02/26/2020 FINDINGS: The cardiomediastinal contours are normal. The lungs are clear. Pulmonary vasculature is normal. No consolidation, pleural effusion, or pneumothorax. No acute osseous abnormalities are seen. IMPRESSION: Negative radiographs of the chest. Electronically Signed   By: Keith Rake M.D.   On: 05/22/2021 17:07    ____________________________________________   PROCEDURES  Procedure(s) performed (including Critical Care):  .1-3 Lead EKG Interpretation  Date/Time: 05/22/2021 9:50 PM Performed by: Naaman Plummer, MD Authorized by: Naaman Plummer, MD     Interpretation: normal     ECG rate:  72   ECG rate assessment: normal      Rhythm: sinus rhythm     Ectopy: none     Conduction: normal     ____________________________________________   INITIAL IMPRESSION / ASSESSMENT AND PLAN / ED COURSE  As part of my medical decision making, I reviewed the following data within the electronic medical record, if available:  Nursing notes reviewed and incorporated, Labs reviewed, EKG interpreted, Old chart reviewed, Radiograph reviewed and Notes from prior ED visits reviewed and incorporated        Patient presents with complaints of syncope/presyncope ED Workup:  CBC, BMP, Troponin, BNP, ECG, CXR Differential diagnosis includes HF, ICH, seizure, stroke, HOCM, ACS, aortic dissection, malignant arrhythmia, or GI bleed. Findings: No evidence of acute laboratory abnormalities.  Troponin negative x1 EKG: No e/o STEMI. No evidence of Brugadas sign, delta wave, epsilon wave, significantly prolonged QTc, or malignant  arrhythmia.  Disposition: Discharge. Patient is at baseline at this time. Return precautions expressed and understood in person. Advised follow up with primary care provider or clinic physician in next 24 hours.      ____________________________________________   FINAL CLINICAL IMPRESSION(S) / ED DIAGNOSES  Final diagnoses:  Dehydration  Syncope, unspecified syncope type  Intermittent vomiting     ED Discharge Orders          Ordered    ondansetron (ZOFRAN ODT) 4 MG disintegrating tablet  Every 8 hours PRN        05/22/21 2136             Note:  This document was prepared using Dragon voice recognition software and may include unintentional dictation errors.    Naaman Plummer, MD 05/22/21 [id number]

## 2021-05-22 NOTE — ED Notes (Signed)
Pt A&Ox4 ambulatory at d/c with independent steady gait, NAD. Pt verbalized understanding of d/c instructions, prescription and follow up care.

## 2021-05-22 NOTE — ED Triage Notes (Signed)
Pt to ED POV sent from PCP for weight loss. Pt also reports she is "passing out every day" for a month. Pt c/o chest pain and abd pain for "awhile" States she vomits when she eats.  Has lost approx 20 pounds.  Pt has disheveled appearance. Pt in NAD, RR even and unlabored, ambulatory, clear speech, alert and oriente.d

## 2021-07-24 ENCOUNTER — Telehealth: Payer: Self-pay | Admitting: Pulmonary Disease

## 2021-07-24 MED ORDER — TRELEGY ELLIPTA 100-62.5-25 MCG/INH IN AEPB: 1.0000 | INHALATION_SPRAY | Freq: Every day | RESPIRATORY_TRACT | 6 refills | Status: AC

## 2021-07-24 NOTE — Telephone Encounter (Signed)
Rx for Trelegy has been sent to preferred pharmacy.  Patient is aware and voiced her understanding.  Nothing further needed at this time.

## 2021-08-22 NOTE — Telephone Encounter (Signed)
Nexplanon not rcvd. Patient decided on patch.

## 2021-09-11 ENCOUNTER — Ambulatory Visit: Payer: Medicare Other | Admitting: Pulmonary Disease

## 2021-10-08 ENCOUNTER — Ambulatory Visit: Payer: Medicare Other | Admitting: Pulmonary Disease

## 2021-11-04 ENCOUNTER — Other Ambulatory Visit: Payer: Self-pay | Admitting: Family Medicine

## 2021-11-04 ENCOUNTER — Ambulatory Visit: Payer: Medicare Other | Admitting: Pulmonary Disease

## 2021-11-04 DIAGNOSIS — N939 Abnormal uterine and vaginal bleeding, unspecified: Secondary | ICD-10-CM

## 2021-11-04 DIAGNOSIS — R102 Pelvic and perineal pain: Secondary | ICD-10-CM

## 2021-11-14 ENCOUNTER — Ambulatory Visit
Admission: RE | Admit: 2021-11-14 | Discharge: 2021-11-14 | Disposition: A | Discharge status: Home / Self Care | Payer: Medicare Other | Source: Ambulatory Visit | Attending: Family Medicine | Admitting: Family Medicine

## 2021-11-14 ENCOUNTER — Other Ambulatory Visit: Payer: Self-pay

## 2021-11-14 ENCOUNTER — Other Ambulatory Visit: Payer: Self-pay | Admitting: Family Medicine

## 2021-11-14 DIAGNOSIS — N939 Abnormal uterine and vaginal bleeding, unspecified: Secondary | ICD-10-CM

## 2021-11-14 DIAGNOSIS — R102 Pelvic and perineal pain: Secondary | ICD-10-CM | POA: Diagnosis present

## 2021-11-14 DIAGNOSIS — Z87442 Personal history of urinary calculi: Secondary | ICD-10-CM | POA: Diagnosis present

## 2021-11-20 ENCOUNTER — Ambulatory Visit: Payer: Medicare Other | Admitting: Obstetrics & Gynecology

## 2021-11-24 ENCOUNTER — Other Ambulatory Visit: Payer: Self-pay | Admitting: Family Medicine

## 2021-11-24 DIAGNOSIS — N83202 Unspecified ovarian cyst, left side: Secondary | ICD-10-CM

## 2022-01-26 ENCOUNTER — Ambulatory Visit
Admission: RE | Admit: 2022-01-26 | Discharge: 2022-01-26 | Disposition: A | Discharge status: Home / Self Care | Payer: Medicare Other | Source: Ambulatory Visit | Attending: Family Medicine | Admitting: Family Medicine

## 2022-01-26 DIAGNOSIS — N83202 Unspecified ovarian cyst, left side: Secondary | ICD-10-CM | POA: Diagnosis present

## 2022-08-03 ENCOUNTER — Emergency Department
Admission: EM | Admit: 2022-08-03 | Discharge: 2022-08-03 | Disposition: A | Discharge status: Home / Self Care | Payer: Medicare Other | Attending: Student in an Organized Health Care Education/Training Program | Admitting: Student in an Organized Health Care Education/Training Program

## 2022-08-03 ENCOUNTER — Other Ambulatory Visit: Payer: Self-pay

## 2022-08-03 ENCOUNTER — Emergency Department: Payer: Medicare Other

## 2022-08-03 ENCOUNTER — Encounter: Payer: Self-pay | Admitting: Intensive Care

## 2022-08-03 DIAGNOSIS — R1084 Generalized abdominal pain: Secondary | ICD-10-CM

## 2022-08-03 DIAGNOSIS — N939 Abnormal uterine and vaginal bleeding, unspecified: Secondary | ICD-10-CM | POA: Diagnosis not present

## 2022-08-03 LAB — CBC WITH DIFFERENTIAL/PLATELET
Abs Immature Granulocytes: 0.03 10*3/uL (ref 0.00–0.07)
Basophils Absolute: 0 10*3/uL (ref 0.0–0.1)
Basophils Relative: 1 %
Eosinophils Absolute: 0.1 10*3/uL (ref 0.0–0.5)
Eosinophils Relative: 2 %
HCT: 42.9 % (ref 36.0–46.0)
Hemoglobin: 15 g/dL (ref 12.0–15.0)
Immature Granulocytes: 0 %
Lymphocytes Relative: 19 %
Lymphs Abs: 1.6 10*3/uL (ref 0.7–4.0)
MCH: 33.3 pg (ref 26.0–34.0)
MCHC: 35 g/dL (ref 30.0–36.0)
MCV: 95.3 fL (ref 80.0–100.0)
Monocytes Absolute: 0.5 10*3/uL (ref 0.1–1.0)
Monocytes Relative: 6 %
Neutro Abs: 6.1 10*3/uL (ref 1.7–7.7)
Neutrophils Relative %: 72 %
Platelets: 318 10*3/uL (ref 150–400)
RBC: 4.5 MIL/uL (ref 3.87–5.11)
RDW: 11.2 % — ABNORMAL LOW (ref 11.5–15.5)
WBC: 8.3 10*3/uL (ref 4.0–10.5)
nRBC: 0 % (ref 0.0–0.2)

## 2022-08-03 LAB — URINALYSIS, ROUTINE W REFLEX MICROSCOPIC
Bilirubin Urine: NEGATIVE
Glucose, UA: NEGATIVE mg/dL
Ketones, ur: NEGATIVE mg/dL
Leukocytes,Ua: NEGATIVE
Nitrite: NEGATIVE
Protein, ur: NEGATIVE mg/dL
Specific Gravity, Urine: 1.002 — ABNORMAL LOW (ref 1.005–1.030)
pH: 7 (ref 5.0–8.0)

## 2022-08-03 LAB — COMPREHENSIVE METABOLIC PANEL
ALT: 18 U/L (ref 0–44)
AST: 21 U/L (ref 15–41)
Albumin: 4.8 g/dL (ref 3.5–5.0)
Alkaline Phosphatase: 49 U/L (ref 38–126)
Anion gap: 7 (ref 5–15)
BUN: 5 mg/dL — ABNORMAL LOW (ref 6–20)
CO2: 27 mmol/L (ref 22–32)
Calcium: 9.3 mg/dL (ref 8.9–10.3)
Chloride: 104 mmol/L (ref 98–111)
Creatinine, Ser: 0.59 mg/dL (ref 0.44–1.00)
GFR, Estimated: >60 mL/min (ref >60)
Glucose, Bld: 112 mg/dL — ABNORMAL HIGH (ref 70–99)
Potassium: 3.5 mmol/L (ref 3.5–5.1)
Sodium: 138 mmol/L (ref 135–145)
Total Bilirubin: 1.6 mg/dL — ABNORMAL HIGH (ref 0.3–1.2)
Total Protein: 8.5 g/dL — ABNORMAL HIGH (ref 6.5–8.1)

## 2022-08-03 LAB — LIPASE, BLOOD: Lipase: 24 U/L (ref 11–51)

## 2022-08-03 LAB — POC URINE PREG, ED: Preg Test, Ur: NEGATIVE

## 2022-08-03 MED ORDER — OXYCODONE-ACETAMINOPHEN 5-325 MG PO TABS
1.0000 | ORAL_TABLET | Freq: Once | ORAL | Status: AC
  Administered 2022-08-03: 1 via ORAL
  Filled 2022-08-03: qty 1

## 2022-08-03 MED ORDER — ONDANSETRON 4 MG PO TBDP
4.0000 mg | ORAL_TABLET | Freq: Once | ORAL | Status: AC
  Administered 2022-08-03: 4 mg via ORAL
  Filled 2022-08-03: qty 1

## 2022-08-03 MED ORDER — KETOROLAC TROMETHAMINE 30 MG/ML IJ SOLN: 15.0000 mg | Freq: Once | INTRAMUSCULAR | Status: DC

## 2022-08-03 NOTE — Discharge Instructions (Signed)

## 2022-08-03 NOTE — ED Provider Triage Note (Signed)
  Emergency Medicine Provider Triage Evaluation Note  Hannah Shaw , a 26 y.o.female,  was evaluated in triage.  Pt complains of upper abdominal pain and vaginal bleeding.  She states that the symptoms been going on for the past 3 days.  Endorses significant pain in the epigastric region.  She states that her vaginal bleeding is heavy.  LMP was 2 weeks ago.     Review of Systems  Positive: Abdominal pain, vaginal bleeding. Negative: Denies fever, chest pain, vomiting  Physical Exam  There were no vitals filed for this visit. Gen:   Awake, no distress   Resp:  Normal effort  MSK:   Moves extremities without difficulty  Other:  Mild tenderness appreciated in the upper abdomen.  Medical Decision Making  Given the patient's initial medical screening exam, the following diagnostic evaluation has been ordered. The patient will be placed in the appropriate treatment space, once one is available, to complete the evaluation and treatment. I have discussed the plan of care with the patient and I have advised the patient that an ED physician or mid-level practitioner will reevaluate their condition after the test results have been received, as the results may give them additional insight into the type of treatment they may need.    Diagnostics: Labs, UA, urine pregnancy.  Treatments: none immediately   Teodoro Spray, [place] 08/03/22 1626

## 2022-08-03 NOTE — ED Provider Notes (Signed)
[facility] Provider Note    Event Date/Time   First MD Initiated Contact with Patient 08/03/22 2039     (approximate)   History   Abdominal Pain and Vaginal Bleeding   HPI  Hannah Shaw is a 26 y.o. female to the ER for evaluation of epigastric pain as well as vaginal bleeding.  States that she has history of irregular menses.  Denies any discharge.  No fevers or chills.  No chest pain or shortness of breath.     Physical Exam   Triage Vital Signs: ED Triage Vitals  Enc Vitals Group     BP 08/03/22 1651 128/79     Pulse Rate 08/03/22 1651 (!) 109     Resp 08/03/22 1651 16     Temp 08/03/22 1651 98.5 F (36.9 C)     Temp Source 08/03/22 1651 Oral     SpO2 08/03/22 1651 100 %     Weight 08/03/22 1653 96 lb (43.5 kg)     Height 08/03/22 1653 '5\' 2"'$  (1.575 m)     Head Circumference --      Peak Flow --      Pain Score 08/03/22 1653 10     Pain Loc --      Pain Edu? --      Excl. in [place]? --     Most recent vital signs: Vitals:   08/03/22 2132 08/03/22 2249  BP: 125/81 105/60  Pulse: 100 83  Resp: (!) 22 20  Temp:    SpO2: 100% 94%     Constitutional: Alert  Eyes: Conjunctivae are normal.  Head: Atraumatic. Nose: No congestion/rhinnorhea. Mouth/Throat: Mucous membranes are moist.   Neck: Painless ROM.  Cardiovascular:   Good peripheral circulation. Respiratory: Normal respiratory effort.  No retractions.  Gastrointestinal: Soft with mild epigastric abdominal pain.  No lower abd pain Musculoskeletal:  no deformity Neurologic:  MAE spontaneously. No gross focal neurologic deficits are appreciated.  Skin:  Skin is warm, dry and intact. No rash noted. Psychiatric: Mood and affect are normal. Speech and behavior are normal.    ED Results / Procedures / Treatments   Labs (all labs ordered are listed, but only abnormal results are displayed) Labs Reviewed  CBC WITH DIFFERENTIAL/PLATELET - Abnormal; Notable for the following  components:      Result Value   RDW 11.2 (*)    All other components within normal limits  COMPREHENSIVE METABOLIC PANEL - Abnormal; Notable for the following components:   Glucose, Bld 112 (*)    BUN 5 (*)    Total Protein 8.5 (*)    Total Bilirubin 1.6 (*)    All other components within normal limits  URINALYSIS, ROUTINE W REFLEX MICROSCOPIC - Abnormal; Notable for the following components:   Color, Urine STRAW (*)    APPearance CLEAR (*)    Specific Gravity, Urine 1.002 (*)    Hgb urine dipstick MODERATE (*)    Bacteria, UA RARE (*)    All other components within normal limits  LIPASE, BLOOD  POC URINE PREG, ED     EKG     RADIOLOGY Please see ED Course for my review and interpretation.  I personally reviewed all radiographic images ordered to evaluate for the above acute complaints and reviewed radiology reports and findings.  These findings were personally discussed with the patient.  Please see medical record for radiology report.    PROCEDURES:  Critical Care performed:   Procedures   MEDICATIONS  ORDERED IN ED: Medications  ketorolac (TORADOL) 30 MG/ML injection 15 mg (15 mg Intravenous Not Given 08/03/22 2242)  oxyCODONE-acetaminophen (PERCOCET/ROXICET) 5-325 MG per tablet 1 tablet (1 tablet Oral Given 08/03/22 2105)  oxyCODONE-acetaminophen (PERCOCET/ROXICET) 5-325 MG per tablet 1 tablet (1 tablet Oral Given 08/03/22 2247)  ondansetron (ZOFRAN-ODT) disintegrating tablet 4 mg (4 mg Oral Given 08/03/22 2247)     IMPRESSION / MDM / ASSESSMENT AND PLAN / ED COURSE  I reviewed the triage vital signs and the nursing notes.                              Differential diagnosis includes, but is not limited to, enteritis, gastritis, obstruction, biliary pathology, diverticulitis, colitis, UTI, pregnancy, ectopic, AUB, DU B  Patient presenting to the ER for evaluation of symptoms as described above.  Base on symptoms, risk factors and considered above differential,  this presenting complaint could reflect a potentially life-threatening illness therefore the patient will be placed on continuous pulse oximetry and telemetry for monitoring.  Laboratory evaluation will be sent to evaluate for the above complaints.      Clinical Course as of 08/03/22 2255  Mon Aug 03, 2022  2226 T imaging on my review and interpretation does not show evidence of obstruction.  Patient tolerating p.o.  Her hemoglobin is stable no white count no fever.  Regarding her vaginal bleeding discussed option for birth control but she states that she has had intolerances and allergic reactions to them in the past.  Seems like her bleeding is quite mild at this point and be appropriate for outpatient follow-up.  We discussed strict return precautions.  Patient agreeable to plan. [PR]    Clinical Course User Index [PR] Merlyn Lot, MD      FINAL CLINICAL IMPRESSION(S) / ED DIAGNOSES   Final diagnoses:  Generalized abdominal pain  Vaginal bleeding     Rx / DC Orders   ED Discharge Orders     None        Note:  This document was prepared using Dragon voice recognition software and may include unintentional dictation errors.    Merlyn Lot, MD 08/03/22 2255

## 2022-08-03 NOTE — ED Triage Notes (Signed)
Patient c/o abdominal pain and vaginal bleeding. Reports heavy vaginal bleeding and not suppose to be on her menstrual cycle yet.

## 2022-10-09 ENCOUNTER — Other Ambulatory Visit (HOSPITAL_COMMUNITY)
Admission: RE | Admit: 2022-10-09 | Discharge: 2022-10-09 | Disposition: A | Discharge status: Home / Self Care | Payer: Medicare Other | Source: Ambulatory Visit | Attending: Obstetrics & Gynecology | Admitting: Obstetrics & Gynecology

## 2022-10-09 ENCOUNTER — Encounter: Payer: Self-pay | Admitting: Obstetrics & Gynecology

## 2022-10-09 ENCOUNTER — Ambulatory Visit (INDEPENDENT_AMBULATORY_CARE_PROVIDER_SITE_OTHER): Payer: Medicare Other | Admitting: Obstetrics & Gynecology

## 2022-10-09 VITALS — BP 99/67 | HR 89 | Ht 62.0 in | Wt 99.0 lb

## 2022-10-09 DIAGNOSIS — Z3202 Encounter for pregnancy test, result negative: Secondary | ICD-10-CM

## 2022-10-09 DIAGNOSIS — R87612 Low grade squamous intraepithelial lesion on cytologic smear of cervix (LGSIL): Secondary | ICD-10-CM | POA: Diagnosis present

## 2022-10-09 LAB — POCT URINE PREGNANCY: Preg Test, Ur: NEGATIVE

## 2022-10-09 NOTE — Progress Notes (Signed)
    Patient name: Hannah Shaw MRN [id number]  Date of birth: 01-Jan-1996 Chief Complaint:   Colposcopy  History of Present Illness:   Hannah Shaw is a 26 y.o. [id number] female being seen today for cervical dysplasia management.  Smoker:  Yes.    Not currently sexually active, one prior partner.  Denies interest in contraception at this time.  Additionally, she notes that she is not able to take COCs due to allergies.    AUB: Upon review of her records, it seems that PCP has addressed this in the past, but pt decided not to pursue treatment.  History of abnormal Pap: no  No LMP recorded (lmp unknown). (Menstrual status: Irregular Periods).      No data to display           Review of Systems:   Pertinent items are noted in HPI Denies fever/chills, dizziness, headaches, visual disturbances, fatigue, shortness of breath, chest pain, abdominal pain, vomiting, no problems with bowel movements, urination, or intercourse unless otherwise stated above.  Pertinent History Reviewed:  Reviewed past medical,surgical, social, obstetrical and family history.  Reviewed problem list, medications and allergies. Physical Assessment:   Vitals:   10/09/22 1038  BP: 99/67  Pulse: 89  Weight: 99 lb (44.9 kg)  Height: '5\' 2"'$  (1.575 m)  Body mass index is 18.11 kg/m.       Physical Examination:   General appearance: alert, well appearing, and in no distress  Psych: mood appropriate, normal affect  Skin: warm & dry   Cardiovascular: normal heart rate noted  Respiratory: normal respiratory effort, no distress  Abdomen: soft, non-tender   Pelvic: VULVA: normal appearing vulva with no masses, tenderness or lesions, VAGINA: normal appearing vagina with normal color and discharge, no lesions, CERVIX: see colposcopy section  Extremities: no edema   Chaperone: Celene Squibb     Colposcopy Procedure Note  Indications: LSIL    Procedure Details  The risks and benefits of the  procedure and Written informed consent obtained.  Speculum placed in vagina and excellent visualization of cervix achieved, cervix swabbed x 3 with acetic acid solution.  Findings: Adequate colposcopy is noted today.  TMZ zone present  Cervix: Diffuse acetowhite changes noted at cervix; ECC and cervical biopsies obtained.    Monsel's applied.  Adequate hemostasis noted  Specimens: ECC and cervical biopsies  Complications: none.  Colposcopic Impression: CIN 1   Plan(Based on 2019 ASCCP recommendations)  -Discussed HPV- reviewed incidence and its potential to cause condylomas to dysplasia to cervical cancer -Reviewed degree of abnormal pap smears  -Discussed ASCCP guidelines and current recommendations for colposcopy -As above, inform consent obtained and procedure completed -biopsies obtained, further management pending results -Questions and concerns were addressed  Janyth Pupa, DO Attending [place], [place] for [place], [place]

## 2022-10-12 LAB — SURGICAL PATHOLOGY

## 2022-10-26 HISTORY — PX: GALLBLADDER SURGERY: SHX652

## 2022-12-26 ENCOUNTER — Encounter: Payer: Self-pay | Admitting: Intensive Care

## 2022-12-26 ENCOUNTER — Emergency Department: Payer: 59

## 2022-12-26 ENCOUNTER — Emergency Department
Admission: EM | Admit: 2022-12-26 | Discharge: 2022-12-26 | Disposition: A | Discharge status: Home / Self Care | Payer: 59 | Attending: Emergency Medicine | Admitting: Emergency Medicine

## 2022-12-26 ENCOUNTER — Other Ambulatory Visit: Payer: Self-pay

## 2022-12-26 DIAGNOSIS — R569 Unspecified convulsions: Secondary | ICD-10-CM

## 2022-12-26 DIAGNOSIS — N390 Urinary tract infection, site not specified: Secondary | ICD-10-CM

## 2022-12-26 DIAGNOSIS — E039 Hypothyroidism, unspecified: Secondary | ICD-10-CM | POA: Insufficient documentation

## 2022-12-26 LAB — URINE DRUG SCREEN, QUALITATIVE (ARMC ONLY)
Amphetamines, Ur Screen: NOT DETECTED
Barbiturates, Ur Screen: NOT DETECTED
Benzodiazepine, Ur Scrn: POSITIVE — AB
Cannabinoid 50 Ng, Ur ~~LOC~~: NOT DETECTED
Cocaine Metabolite,Ur ~~LOC~~: NOT DETECTED
MDMA (Ecstasy)Ur Screen: NOT DETECTED
Methadone Scn, Ur: NOT DETECTED
Opiate, Ur Screen: NOT DETECTED
Phencyclidine (PCP) Ur S: NOT DETECTED
Tricyclic, Ur Screen: NOT DETECTED

## 2022-12-26 LAB — BASIC METABOLIC PANEL
Anion gap: 8 (ref 5–15)
BUN: 6 mg/dL (ref 6–20)
CO2: 23 mmol/L (ref 22–32)
Calcium: 8.7 mg/dL — ABNORMAL LOW (ref 8.9–10.3)
Chloride: 105 mmol/L (ref 98–111)
Creatinine, Ser: 0.67 mg/dL (ref 0.44–1.00)
GFR, Estimated: >60 mL/min (ref >60)
Glucose, Bld: 94 mg/dL (ref 70–99)
Potassium: 4 mmol/L (ref 3.5–5.1)
Sodium: 136 mmol/L (ref 135–145)

## 2022-12-26 LAB — CBC
HCT: 41.5 % (ref 36.0–46.0)
Hemoglobin: 14.5 g/dL (ref 12.0–15.0)
MCH: 33.3 pg (ref 26.0–34.0)
MCHC: 34.9 g/dL (ref 30.0–36.0)
MCV: 95.4 fL (ref 80.0–100.0)
Platelets: 244 10*3/uL (ref 150–400)
RBC: 4.35 MIL/uL (ref 3.87–5.11)
RDW: 10.9 % — ABNORMAL LOW (ref 11.5–15.5)
WBC: 5.7 10*3/uL (ref 4.0–10.5)
nRBC: 0 % (ref 0.0–0.2)

## 2022-12-26 LAB — URINALYSIS, ROUTINE W REFLEX MICROSCOPIC
Bilirubin Urine: NEGATIVE
Glucose, UA: NEGATIVE mg/dL
Ketones, ur: NEGATIVE mg/dL
Nitrite: NEGATIVE
Protein, ur: NEGATIVE mg/dL
Specific Gravity, Urine: 1.005 (ref 1.005–1.030)
pH: 7 (ref 5.0–8.0)

## 2022-12-26 LAB — POC URINE PREG, ED: Preg Test, Ur: NEGATIVE

## 2022-12-26 MED ORDER — CEPHALEXIN 250 MG PO CAPS
250.0000 mg | ORAL_CAPSULE | Freq: Once | ORAL | Status: DC
Start: 2022-12-26 — End: 2022-12-26

## 2022-12-26 MED ORDER — IBUPROFEN 600 MG PO TABS
600.0000 mg | ORAL_TABLET | Freq: Once | ORAL | Status: AC
  Administered 2022-12-26: 600 mg via ORAL
  Filled 2022-12-26: qty 1

## 2022-12-26 MED ORDER — FOSFOMYCIN TROMETHAMINE 3 G PO PACK
3.0000 g | PACK | ORAL | Status: AC
  Administered 2022-12-26: 3 g via ORAL
  Filled 2022-12-26: qty 3

## 2022-12-26 MED ORDER — LEVETIRACETAM 500 MG PO TABS: 500.0000 mg | ORAL_TABLET | Freq: Two times a day (BID) | ORAL | 0 refills | Status: DC

## 2022-12-26 MED ORDER — LEVETIRACETAM 500 MG PO TABS
500.0000 mg | ORAL_TABLET | Freq: Once | ORAL | Status: AC
  Administered 2022-12-26: 500 mg via ORAL
  Filled 2022-12-26: qty 1

## 2022-12-26 NOTE — ED Provider Notes (Signed)
[facility] Provider Note    Event Date/Time   First MD Initiated Contact with Patient 12/26/22 1800     (approximate)   History   Seizures   HPI  Hannah Shaw is a 27 y.o. female history of developmental disorder, hypothyroidism, depression, anxiety and the patient reports a remote history of seizures but since taken off her seizure medicine which she reports was Xanax  Patient reports that a long time ago she used to take Xanax and it helped with her "seizures".  However she has not taken it for a long time.  She denies any recent use of any medications, alcohol or prescription medication such as Xanax  Patient states on Wednesday she had a "seizure".  She seems to recall elements of it.  And then last night she reports she had another seizure wherein she fell and struck of her forehead which is sore.     Physical Exam   Triage Vital Signs: ED Triage Vitals  Enc Vitals Group     BP 12/26/22 1743 128/83     Pulse Rate 12/26/22 1743 (!) 103     Resp 12/26/22 1743 (!) 23     Temp 12/26/22 1743 98.8 F (37.1 C)     Temp src --      SpO2 12/26/22 1743 100 %     Weight 12/26/22 1731 101 lb (45.8 kg)     Height 12/26/22 1731 '5\' 2"'$  (1.575 m)     Head Circumference --      Peak Flow --      Pain Score 12/26/22 1731 9     Pain Loc --      Pain Edu? --      Excl. in [place]? --     Most recent vital signs: Vitals:   12/26/22 1800 12/26/22 1900  BP: 112/69 121/76  Pulse: (!) 111 (!) 101  Resp: (!) 22 20  Temp:    SpO2: 95% 98%     General: Awake, no distress.  CV:  Good peripheral perfusion.  Normal tones and rate Resp:  Normal effort.  Clear bilateral Abd:  No distention.  Other:  Clear speech.  Normal facial expressions.  Alert well-oriented at this time.  Reports she does not know her home address, but is aware of the situation, presentation, concerns of having 2 episodes of seizure-like activity etc.  She recognizes her boyfriend normal  conversation   ED Results / Procedures / Treatments   Labs (all labs ordered are listed, but only abnormal results are displayed) Labs Reviewed  URINALYSIS, ROUTINE W REFLEX MICROSCOPIC - Abnormal; Notable for the following components:      Result Value   Color, Urine YELLOW (*)    APPearance HAZY (*)    Hgb urine dipstick MODERATE (*)    Leukocytes,Ua MODERATE (*)    Bacteria, UA FEW (*)    All other components within normal limits  CBC - Abnormal; Notable for the following components:   RDW 10.9 (*)    All other components within normal limits  BASIC METABOLIC PANEL - Abnormal; Notable for the following components:   Calcium 8.7 (*)    All other components within normal limits  URINE DRUG SCREEN, QUALITATIVE ([facility] ONLY) - Abnormal; Notable for the following components:   Benzodiazepine, Ur Scrn POSITIVE (*)    All other components within normal limits  POC URINE PREG, ED     EKG  Reviewed interpreted by me sinus tachycardia.  Mild repolarization abnormality. Notable T wave abnormality occluding T wave depressions in inferior distribution.  Some seen laterally  Compared to previous EKG from July 2022 no significant changes noted.  T wave abnormality appears to be pre-existing and chronic  RADIOLOGY  CT head interpreted as negative for acute gross pathology   CT Head Wo Contrast  Result Date: 12/26/2022 CLINICAL DATA:  Seizure, new-onset, no history of trauma EXAM: CT HEAD WITHOUT CONTRAST TECHNIQUE: Contiguous axial images were obtained from the base of the skull through the vertex without intravenous contrast. RADIATION DOSE REDUCTION: This exam was performed according to the departmental dose-optimization program which includes automated exposure control, adjustment of the mA and/or kV according to patient size and/or use of iterative reconstruction technique. COMPARISON:  None Available. FINDINGS: Brain: No evidence of acute infarction, hemorrhage, hydrocephalus,  extra-axial collection or mass lesion/mass effect. Vascular: No hyperdense vessel or unexpected calcification. Skull: Normal. Negative for fracture or focal lesion. Sinuses/Orbits: No acute finding. IMPRESSION: No acute intracranial process. Electronically Signed   By: Sammie Bench M.D.   On: 12/26/2022 18:49      PROCEDURES:  Critical Care performed: No  Procedures   MEDICATIONS ORDERED IN ED: Medications  ibuprofen (ADVIL) tablet 600 mg (600 mg Oral Given 12/26/22 1848)  fosfomycin (MONUROL) packet 3 g (3 g Oral Given 12/26/22 1921)  levETIRAcetam (KEPPRA) tablet 500 mg (500 mg Oral Given 12/26/22 2201)     IMPRESSION / MDM / ASSESSMENT AND PLAN / ED COURSE  I reviewed the triage vital signs and the nursing notes.                              Differential diagnosis includes, but is not limited to, possible seizure, and further history taking and discussion with patient and her family as well as her significant other it seems though that stress seems to be very much a trigger for her seizures and she has been treated with antianxiety medications in the past.  She is currently alert well-oriented denies this was passing out or syncopal-like episodes she has no acute neurologic abnormalities grossly intact moves all extremities well normal facial expression and normal cranial nerve deficits etc.    Patient's presentation is most consistent with acute complicated illness / injury requiring diagnostic workup.   The patient is on the cardiac monitor to evaluate for evidence of arrhythmia and/or significant heart rate changes.  Return precautions and treatment recommendations and follow-up discussed with the patient who is agreeable with the plan.  Discussed risks and benefits of trialing initiation of Keppra given reported 2 recent seizures.  Unclear if this represents epileptiform activity but seems unlikely.  Seizure precautions advised the patient and she does not drive.  Will place on  Keppra and recommendation to make follow-up with neurology.  Patient reports she has follow-up with her physician on the 15th of this month as well and was planning to discuss the symptoms at that time with her physician.      FINAL CLINICAL IMPRESSION(S) / ED DIAGNOSES   Final diagnoses:  Seizure-like activity ([place])  Urinary tract infection, acute     Rx / DC Orders   ED Discharge Orders          Ordered    levETIRAcetam (KEPPRA) 500 MG tablet  2 times daily        12/26/22 2155             Note:  This document was prepared using Dragon voice recognition software and may include unintentional dictation errors.   Delman Kitten, MD 12/26/22 2201

## 2022-12-26 NOTE — Discharge Instructions (Addendum)
No driving until cleared by a physician.  I strongly recommend you set up follow-up with a neurologist, please call to set up an appointment.

## 2022-12-26 NOTE — ED Notes (Addendum)
Pt arrived with complaint of seizures. Pt states she had a seizure Wednesday and another seizure last night. Pt reports her family witnessed the seizure and is not sure how long it lasted. Pt states her family reported her "whole body was shaking and foam was coming from her mouth". Pt reports last night she was standing when it occurred, her vision got blurry and she fell and hit her head. Pt reports her family saying she stopped breathing and EMS was called but pt came to within 31mn. Pt refused to come to ER last night because they were rude to her. Pt states a hx of seizures but had not had one in a long time so they took her off her medications. Pt does not remember which medication she was on. Pt currently complaining of headache "all around my head". Pt denies any drug or alcohol use. Pt is A&Ox4.   Pt in bed on cardiac and vital sign monitoring. Seizure precautions in place.
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
## 2022-12-26 NOTE — ED Triage Notes (Signed)
Patient reports having seizure Wednesday and last night. Reports hitting head last night. C/o pain in head and chest pain that started last night.

## 2022-12-26 NOTE — ED Notes (Signed)
Paged Lorrin Goodell, MD (Neurology) at [facility] . MD has already called back.

## 2023-01-26 ENCOUNTER — Emergency Department
Admission: EM | Admit: 2023-01-26 | Discharge: 2023-01-26 | Disposition: A | Discharge status: Home / Self Care | Payer: 59 | Attending: Emergency Medicine | Admitting: Emergency Medicine

## 2023-01-26 ENCOUNTER — Other Ambulatory Visit: Payer: Self-pay

## 2023-01-26 ENCOUNTER — Encounter: Payer: Self-pay | Admitting: *Deleted

## 2023-01-26 ENCOUNTER — Emergency Department: Payer: 59

## 2023-01-26 DIAGNOSIS — R1032 Left lower quadrant pain: Secondary | ICD-10-CM | POA: Diagnosis not present

## 2023-01-26 DIAGNOSIS — R1033 Periumbilical pain: Secondary | ICD-10-CM | POA: Diagnosis not present

## 2023-01-26 DIAGNOSIS — R109 Unspecified abdominal pain: Secondary | ICD-10-CM

## 2023-01-26 LAB — COMPREHENSIVE METABOLIC PANEL
ALT: 17 U/L (ref 0–44)
AST: 25 U/L (ref 15–41)
Albumin: 4.5 g/dL (ref 3.5–5.0)
Alkaline Phosphatase: 42 U/L (ref 38–126)
Anion gap: 10 (ref 5–15)
BUN: 5 mg/dL — ABNORMAL LOW (ref 6–20)
CO2: 27 mmol/L (ref 22–32)
Calcium: 9.2 mg/dL (ref 8.9–10.3)
Chloride: 98 mmol/L (ref 98–111)
Creatinine, Ser: 0.61 mg/dL (ref 0.44–1.00)
GFR, Estimated: >60 mL/min (ref >60)
Glucose, Bld: 104 mg/dL — ABNORMAL HIGH (ref 70–99)
Potassium: 3.8 mmol/L (ref 3.5–5.1)
Sodium: 135 mmol/L (ref 135–145)
Total Bilirubin: 0.6 mg/dL (ref 0.3–1.2)
Total Protein: 8.2 g/dL — ABNORMAL HIGH (ref 6.5–8.1)

## 2023-01-26 LAB — URINALYSIS, ROUTINE W REFLEX MICROSCOPIC
Bilirubin Urine: NEGATIVE
Glucose, UA: NEGATIVE mg/dL
Ketones, ur: NEGATIVE mg/dL
Leukocytes,Ua: NEGATIVE
Nitrite: NEGATIVE
Protein, ur: NEGATIVE mg/dL
Specific Gravity, Urine: 1.004 — ABNORMAL LOW (ref 1.005–1.030)
pH: 5 (ref 5.0–8.0)

## 2023-01-26 LAB — CBC
HCT: 45.8 % (ref 36.0–46.0)
Hemoglobin: 16.2 g/dL — ABNORMAL HIGH (ref 12.0–15.0)
MCH: 32.5 pg (ref 26.0–34.0)
MCHC: 35.4 g/dL (ref 30.0–36.0)
MCV: 92 fL (ref 80.0–100.0)
Platelets: 276 10*3/uL (ref 150–400)
RBC: 4.98 MIL/uL (ref 3.87–5.11)
RDW: 10.7 % — ABNORMAL LOW (ref 11.5–15.5)
WBC: 5.2 10*3/uL (ref 4.0–10.5)
nRBC: 0 % (ref 0.0–0.2)

## 2023-01-26 LAB — POC URINE PREG, ED: Preg Test, Ur: NEGATIVE

## 2023-01-26 LAB — LIPASE, BLOOD: Lipase: 23 U/L (ref 11–51)

## 2023-01-26 MED ORDER — KETOROLAC TROMETHAMINE 15 MG/ML IJ SOLN
15.0000 mg | Freq: Once | INTRAMUSCULAR | Status: AC
  Administered 2023-01-26: 15 mg via INTRAVENOUS
  Filled 2023-01-26: qty 1

## 2023-01-26 MED ORDER — SODIUM CHLORIDE 0.9 % IV BOLUS
1000.0000 mL | Freq: Once | INTRAVENOUS | Status: AC
  Administered 2023-01-26: 1000 mL via INTRAVENOUS

## 2023-01-26 MED ORDER — ONDANSETRON HCL 4 MG/2ML IJ SOLN
4.0000 mg | Freq: Once | INTRAMUSCULAR | Status: AC
  Administered 2023-01-26: 4 mg via INTRAVENOUS
  Filled 2023-01-26: qty 2

## 2023-01-26 NOTE — ED Provider Notes (Signed)
[facility] Provider Note  Patient Contact: 9:57 PM (approximate)   History   Abdominal Pain   HPI  Hannah Shaw is a 27 y.o. female with a history of endometriosis, GERD and nephrolithiasis, presents to the emergency department with periumbilical and left lower quadrant abdominal discomfort that started today.  Patient reports that she experienced onset of her menses today as well and has had some associated diarrhea.  No fever or chills at home.  No dysuria or increased urinary frequency.  No chest pain, chest tightness or shortness of breath      Physical Exam   Triage Vital Signs: ED Triage Vitals  Enc Vitals Group     BP 01/26/23 2000 119/79     Pulse Rate 01/26/23 2000 93     Resp 01/26/23 2000 20     Temp 01/26/23 2000 99 F (37.2 C)     Temp Source 01/26/23 2000 Oral     SpO2 01/26/23 2000 99 %     Weight 01/26/23 1957 100 lb (45.4 kg)     Height 01/26/23 1957 5\' 2"  (1.575 m)     Head Circumference --      Peak Flow --      Pain Score 01/26/23 1957 10     Pain Loc --      Pain Edu? --      Excl. in [place]? --     Most recent vital signs: Vitals:   01/26/23 2000 01/26/23 2244  BP: 119/79 120/81  Pulse: 93 90  Resp: 20 20  Temp: 99 F (37.2 C) 98.9 F (37.2 C)  SpO2: 99% 99%     General: Alert and in no acute distress. Eyes:  PERRL. EOMI. Head: No acute traumatic findings ENT:      Nose: No congestion/rhinnorhea.      Mouth/Throat: Mucous membranes are moist. Neck: No stridor. No cervical spine tenderness to palpation. Cardiovascular:  Good peripheral perfusion Respiratory: Normal respiratory effort without tachypnea or retractions. Lungs CTAB. Good air entry to the bases with no decreased or absent breath sounds. Gastrointestinal: Bowel sounds 4 quadrants. Soft and nontender to palpation. No guarding or rigidity. No palpable masses. No distention. No CVA tenderness. Musculoskeletal: Full range of motion to all extremities.   Neurologic:  No gross focal neurologic deficits are appreciated.  Skin:   No rash noted    ED Results / Procedures / Treatments   Labs (all labs ordered are listed, but only abnormal results are displayed) Labs Reviewed  COMPREHENSIVE METABOLIC PANEL - Abnormal; Notable for the following components:      Result Value   Glucose, Bld 104 (*)    BUN 5 (*)    Total Protein 8.2 (*)    All other components within normal limits  CBC - Abnormal; Notable for the following components:   Hemoglobin 16.2 (*)    RDW 10.7 (*)    All other components within normal limits  URINALYSIS, ROUTINE W REFLEX MICROSCOPIC - Abnormal; Notable for the following components:   Color, Urine YELLOW (*)    APPearance HAZY (*)    Specific Gravity, Urine 1.004 (*)    Hgb urine dipstick MODERATE (*)    Bacteria, UA RARE (*)    All other components within normal limits  LIPASE, BLOOD  POC URINE PREG, ED       RADIOLOGY  I personally viewed and evaluated these images as part of my medical decision making, as well as reviewing the  written report by the radiologist.  ED Provider Interpretation: CT abdomen pelvis without contrast shows no acute abnormality   PROCEDURES:  Critical Care performed: No  Procedures   MEDICATIONS ORDERED IN ED: Medications  sodium chloride 0.9 % bolus 1,000 mL (0 mLs Intravenous Stopped 01/26/23 2245)  ondansetron (ZOFRAN) injection 4 mg (4 mg Intravenous Given 01/26/23 2209)  ketorolac (TORADOL) 15 MG/ML injection 15 mg (15 mg Intravenous Given 01/26/23 2242)     IMPRESSION / MDM / ASSESSMENT AND PLAN / ED COURSE  I reviewed the triage vital signs and the nursing notes.                              Assessment and plan:  Abdominal discomfort 27 year old female presents to the emergency department with periumbilical and left lower quadrant abdominal discomfort that started this morning with onset of patient's menses.  Vital signs reassuring at triage.  On exam, patient  was alert, active and nontoxic-appearing   CT abdomen and pelvis without contrast was reassuring and shows no acute abnormality.  CBC, CMP and lipase largely consistent with patient's baseline labs.  Urine pregnancy test was negative.  Urinalysis indicates moderate amount of blood consistent with patient's chief complaint.  Patient was given an injection of Toradol for abdominal discomfort prior to discharge.  Return precautions were given to return with new or worsening symptoms.  FINAL CLINICAL IMPRESSION(S) / ED DIAGNOSES   Final diagnoses:  Abdominal pain, unspecified abdominal location     Rx / DC Orders   ED Discharge Orders     None        Note:  This document was prepared using Dragon voice recognition software and may include unintentional dictation errors.   Vallarie Mare Foreman, Hershal Coria 01/26/23 2303    Harvest Dark, MD 01/26/23 [phone]

## 2023-01-26 NOTE — ED Triage Notes (Addendum)
Pt reports abd pain with vomiting for 2 days.   No diarrhea.  No back pain   Menses now.  .no urinary sx.  Pt alert.

## 2023-05-18 ENCOUNTER — Other Ambulatory Visit: Payer: Self-pay

## 2023-05-18 ENCOUNTER — Emergency Department
Admission: EM | Admit: 2023-05-18 | Discharge: 2023-05-18 | Disposition: A | Discharge status: Home / Self Care | Payer: 59 | Attending: Emergency Medicine | Admitting: Emergency Medicine

## 2023-05-18 ENCOUNTER — Emergency Department: Payer: 59

## 2023-05-18 DIAGNOSIS — G8929 Other chronic pain: Secondary | ICD-10-CM | POA: Diagnosis not present

## 2023-05-18 DIAGNOSIS — R258 Other abnormal involuntary movements: Secondary | ICD-10-CM | POA: Diagnosis not present

## 2023-05-18 DIAGNOSIS — F439 Reaction to severe stress, unspecified: Secondary | ICD-10-CM | POA: Diagnosis not present

## 2023-05-18 DIAGNOSIS — R109 Unspecified abdominal pain: Secondary | ICD-10-CM | POA: Insufficient documentation

## 2023-05-18 DIAGNOSIS — E876 Hypokalemia: Secondary | ICD-10-CM | POA: Diagnosis not present

## 2023-05-18 DIAGNOSIS — R569 Unspecified convulsions: Secondary | ICD-10-CM

## 2023-05-18 LAB — COMPREHENSIVE METABOLIC PANEL
ALT: 19 U/L (ref 0–44)
AST: 18 U/L (ref 15–41)
Albumin: 4 g/dL (ref 3.5–5.0)
Alkaline Phosphatase: 40 U/L (ref 38–126)
Anion gap: 6 (ref 5–15)
BUN: 6 mg/dL (ref 6–20)
CO2: 26 mmol/L (ref 22–32)
Calcium: 8.5 mg/dL — ABNORMAL LOW (ref 8.9–10.3)
Chloride: 103 mmol/L (ref 98–111)
Creatinine, Ser: 0.52 mg/dL (ref 0.44–1.00)
GFR, Estimated: >60 mL/min (ref >60)
Glucose, Bld: 94 mg/dL (ref 70–99)
Potassium: 2.8 mmol/L — ABNORMAL LOW (ref 3.5–5.1)
Sodium: 135 mmol/L (ref 135–145)
Total Bilirubin: 0.5 mg/dL (ref 0.3–1.2)
Total Protein: 7 g/dL (ref 6.5–8.1)

## 2023-05-18 LAB — CBC WITH DIFFERENTIAL/PLATELET
Abs Immature Granulocytes: 0.02 10*3/uL (ref 0.00–0.07)
Basophils Absolute: 0.1 10*3/uL (ref 0.0–0.1)
Basophils Relative: 1 %
Eosinophils Absolute: 0.2 10*3/uL (ref 0.0–0.5)
Eosinophils Relative: 3 %
HCT: 40.8 % (ref 36.0–46.0)
Hemoglobin: 14.6 g/dL (ref 12.0–15.0)
Immature Granulocytes: 0 %
Lymphocytes Relative: 34 %
Lymphs Abs: 3 10*3/uL (ref 0.7–4.0)
MCH: 33 pg (ref 26.0–34.0)
MCHC: 35.8 g/dL (ref 30.0–36.0)
MCV: 92.1 fL (ref 80.0–100.0)
Monocytes Absolute: 0.5 10*3/uL (ref 0.1–1.0)
Monocytes Relative: 6 %
Neutro Abs: 5.1 10*3/uL (ref 1.7–7.7)
Neutrophils Relative %: 56 %
Platelets: 274 10*3/uL (ref 150–400)
RBC: 4.43 MIL/uL (ref 3.87–5.11)
RDW: 11 % — ABNORMAL LOW (ref 11.5–15.5)
WBC: 9 10*3/uL (ref 4.0–10.5)
nRBC: 0 % (ref 0.0–0.2)

## 2023-05-18 LAB — TROPONIN I (HIGH SENSITIVITY): Troponin I (High Sensitivity): <2 ng/L (ref <18)

## 2023-05-18 MED ORDER — POTASSIUM CHLORIDE CRYS ER 20 MEQ PO TBCR
40.0000 meq | EXTENDED_RELEASE_TABLET | Freq: Once | ORAL | Status: DC
  Filled 2023-05-18: qty 2

## 2023-05-18 MED ORDER — KETOROLAC TROMETHAMINE 30 MG/ML IJ SOLN
30.0000 mg | Freq: Once | INTRAMUSCULAR | Status: AC
  Administered 2023-05-18: 30 mg via INTRAMUSCULAR

## 2023-05-18 MED ORDER — OXYCODONE HCL 5 MG PO TABS
5.0000 mg | ORAL_TABLET | Freq: Once | ORAL | Status: AC
  Administered 2023-05-18: 5 mg via ORAL
  Filled 2023-05-18: qty 1

## 2023-05-18 MED ORDER — POTASSIUM CHLORIDE 20 MEQ PO PACK
40.0000 meq | PACK | Freq: Once | ORAL | Status: AC
  Administered 2023-05-18: 40 meq via ORAL
  Filled 2023-05-18: qty 2

## 2023-05-18 NOTE — ED Triage Notes (Signed)
EMS brings pt in for c/o self-reported "seizure"; pt was not post-ictal upon arrival, talking on scene; also st that she tripped and fell injuring her elbow

## 2023-05-18 NOTE — ED Triage Notes (Addendum)
Pt presents to ER with c/o seizure tonight, along with sob and chest tightness.  Pt reports the sob has been going on for a few days, and the chest tightness has been going on for a few months.  Pt endorses hx of seizures and states she takes keppra at home and has been compliant.  Pt states she has hx of stress related seizures and states she has been stressed recently.  Pt states she does not remember the seizure. Pt also reports a fall (non-related to seizure), where she states she hurt her right elbow.  Pt in sling on arrival. Pt is otherwise A&O x4 and in NAD at this time.
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
## 2023-05-20 ENCOUNTER — Other Ambulatory Visit: Payer: Self-pay

## 2023-05-20 ENCOUNTER — Emergency Department
Admission: EM | Admit: 2023-05-20 | Discharge: 2023-05-21 | Disposition: A | Discharge status: Home / Self Care | Payer: 59 | Attending: Emergency Medicine | Admitting: Emergency Medicine

## 2023-05-20 DIAGNOSIS — R569 Unspecified convulsions: Secondary | ICD-10-CM | POA: Diagnosis present

## 2023-05-20 DIAGNOSIS — R1011 Right upper quadrant pain: Secondary | ICD-10-CM | POA: Insufficient documentation

## 2023-05-20 DIAGNOSIS — R1013 Epigastric pain: Secondary | ICD-10-CM | POA: Diagnosis not present

## 2023-05-20 DIAGNOSIS — G8929 Other chronic pain: Secondary | ICD-10-CM | POA: Insufficient documentation

## 2023-05-20 LAB — COMPREHENSIVE METABOLIC PANEL
ALT: 27 U/L (ref 0–44)
AST: 33 U/L (ref 15–41)
Albumin: 3.9 g/dL (ref 3.5–5.0)
Alkaline Phosphatase: 47 U/L (ref 38–126)
Anion gap: 9 (ref 5–15)
BUN: <5 mg/dL — ABNORMAL LOW (ref 6–20)
CO2: 24 mmol/L (ref 22–32)
Calcium: 8.9 mg/dL (ref 8.9–10.3)
Chloride: 106 mmol/L (ref 98–111)
Creatinine, Ser: 0.74 mg/dL (ref 0.44–1.00)
GFR, Estimated: >60 mL/min (ref >60)
Glucose, Bld: 98 mg/dL (ref 70–99)
Potassium: 3.3 mmol/L — ABNORMAL LOW (ref 3.5–5.1)
Sodium: 139 mmol/L (ref 135–145)
Total Bilirubin: 0.5 mg/dL (ref 0.3–1.2)
Total Protein: 7 g/dL (ref 6.5–8.1)

## 2023-05-20 LAB — CBC
HCT: 38.3 % (ref 36.0–46.0)
Hemoglobin: 13.8 g/dL (ref 12.0–15.0)
MCH: 33.7 pg (ref 26.0–34.0)
MCHC: 36 g/dL (ref 30.0–36.0)
MCV: 93.6 fL (ref 80.0–100.0)
Platelets: 275 10*3/uL (ref 150–400)
RBC: 4.09 MIL/uL (ref 3.87–5.11)
RDW: 10.8 % — ABNORMAL LOW (ref 11.5–15.5)
WBC: 6.9 10*3/uL (ref 4.0–10.5)
nRBC: 0 % (ref 0.0–0.2)

## 2023-05-20 LAB — LIPASE, BLOOD: Lipase: 31 U/L (ref 11–51)

## 2023-05-20 LAB — POC URINE PREG, ED: Preg Test, Ur: NEGATIVE

## 2023-05-20 NOTE — ED Triage Notes (Addendum)
BIB [facility] from home. C/o RUQ abd pain with known dx of gallstones. Pt also reports that she had 9 seizures today with hx of same. Reports compliant with seizure medication. Seen earlier this week for seizures as well. Pt alert and oriented following commands. Breathing unlabored speaking in full sentences with symmetric chest rise and fall. EMS reports that pt informed them that she took tylenol and percocet prior to EMS arrival.

## 2023-05-21 ENCOUNTER — Emergency Department: Payer: 59

## 2023-05-21 DIAGNOSIS — R569 Unspecified convulsions: Secondary | ICD-10-CM | POA: Diagnosis not present

## 2023-05-21 MED ORDER — HYDROCODONE-ACETAMINOPHEN 5-325 MG PO TABS
2.0000 | ORAL_TABLET | Freq: Once | ORAL | Status: AC
  Administered 2023-05-21: 2 via ORAL
  Filled 2023-05-21: qty 2

## 2023-05-21 MED ORDER — LEVETIRACETAM 500 MG PO TABS
1000.0000 mg | ORAL_TABLET | Freq: Once | ORAL | Status: AC
  Administered 2023-05-21: 1000 mg via ORAL
  Filled 2023-05-21: qty 2

## 2023-05-21 MED ORDER — HALOPERIDOL LACTATE 5 MG/ML IJ SOLN
2.5000 mg | Freq: Once | INTRAMUSCULAR | Status: DC
  Filled 2023-05-21: qty 1

## 2023-05-21 MED ORDER — HYDROXYZINE HCL 10 MG PO TABS
10.0000 mg | ORAL_TABLET | Freq: Once | ORAL | Status: DC
  Filled 2023-05-21: qty 1

## 2023-05-21 MED ORDER — PROMETHAZINE HCL 25 MG PO TABS
25.0000 mg | ORAL_TABLET | Freq: Once | ORAL | Status: AC
  Administered 2023-05-21: 25 mg via ORAL
  Filled 2023-05-21: qty 1

## 2023-05-21 MED ORDER — KETOROLAC TROMETHAMINE 30 MG/ML IJ SOLN
15.0000 mg | Freq: Once | INTRAMUSCULAR | Status: DC
  Filled 2023-05-21: qty 1

## 2023-05-21 MED ORDER — SODIUM CHLORIDE 0.9 % IV BOLUS
1000.0000 mL | Freq: Once | INTRAVENOUS | Status: AC
  Administered 2023-05-21: 1000 mL via INTRAVENOUS

## 2023-05-21 MED ORDER — ONDANSETRON 4 MG PO TBDP: 4.0000 mg | ORAL_TABLET | Freq: Once | ORAL | Status: DC

## 2023-05-21 MED ORDER — OXYCODONE HCL 5 MG PO TABS: 5.0000 mg | ORAL_TABLET | Freq: Once | ORAL | Status: DC

## 2023-05-21 MED ORDER — LEVETIRACETAM 750 MG PO TABS: 750.0000 mg | ORAL_TABLET | Freq: Two times a day (BID) | ORAL | 0 refills | Status: DC

## 2023-05-21 NOTE — ED Notes (Signed)
Pt not in room. MD made aware.

## 2023-05-21 NOTE — ED Provider Notes (Signed)
[facility] Provider Note    Event Date/Time   First MD Initiated Contact with Patient 05/20/23 2335     (approximate)   History   Abdominal Pain and Seizures   HPI  Hannah Shaw is a 27 y.o. female  here with multiple complaints. Main complaint is 7-9 episodes of "seizures" today, which she describes as feelings of shaking and being "out of it" though she remembers some episodes and does not always lose consciousness. No tongue biting or loss of bowel or bladder. H/o same. She also c/o abdominal pain, which has been a chronic issue 2/2 gallstones. She was schedule to have surgery but they cancelled 2/2 her neuro work-up. She reports that her seizures are worse w/ stress and pain and that she has had both throughout the day today.       Physical Exam   Triage Vital Signs: ED Triage Vitals  Encounter Vitals Group     BP 05/20/23 2210 105/74     Systolic BP Percentile --      Diastolic BP Percentile --      Pulse Rate 05/20/23 2210 97     Resp 05/20/23 2210 18     Temp 05/20/23 2210 98.1 F (36.7 C)     Temp Source 05/20/23 2210 Oral     SpO2 05/20/23 2210 100 %     Weight 05/20/23 2211 95 lb (43.1 kg)     Height 05/20/23 2211 5\' 2"  (1.575 m)     Head Circumference --      Peak Flow --      Pain Score 05/20/23 2211 7     Pain Loc --      Pain Education --      Exclude from Growth Chart --     Most recent vital signs: Vitals:   05/20/23 2210 05/21/23 0204  BP: 105/74 105/76  Pulse: 97 80  Resp: 18 16  Temp: 98.1 F (36.7 C) 98 F (36.7 C)  SpO2: 100% 99%     General: Awake, no distress.  CV:  Good peripheral perfusion. RRR. Resp:  Normal work of breathing. Lungs clear. Abd:  No distention. Abdomen soft, minimally tender in epigastric and RUQ but no rebound or guarding. Other:  CNII-XII intact. Strength 5/5 bl UE and normal sensation to light touch. Normal FTN. Normal gait. Normal tone throughout.    ED Results / Procedures /  Treatments   Labs (all labs ordered are listed, but only abnormal results are displayed) Labs Reviewed  COMPREHENSIVE METABOLIC PANEL - Abnormal; Notable for the following components:      Result Value   Potassium 3.3 (*)    BUN <5 (*)    All other components within normal limits  CBC - Abnormal; Notable for the following components:   RDW 10.8 (*)    All other components within normal limits  URINALYSIS, ROUTINE W REFLEX MICROSCOPIC - Abnormal; Notable for the following components:   Color, Urine YELLOW (*)    APPearance HAZY (*)    Leukocytes,Ua SMALL (*)    All other components within normal limits  LIPASE, BLOOD  POC URINE PREG, ED     EKG    RADIOLOGY [place] RUQ: Gallstones, no chole   I also independently reviewed and agree with radiologist interpretations.   PROCEDURES:  Critical Care performed: No   MEDICATIONS ORDERED IN ED: Medications  haloperidol lactate (HALDOL) injection 2.5 mg (2.5 mg Intravenous Not Given 05/21/23 0101)  ketorolac (TORADOL)  30 MG/ML injection 15 mg (15 mg Intravenous Not Given 05/21/23 0100)  oxyCODONE (Oxy IR/ROXICODONE) immediate release tablet 5 mg (has no administration in time range)  hydrOXYzine (ATARAX) tablet 10 mg (has no administration in time range)  sodium chloride 0.9 % bolus 1,000 mL (0 mLs Intravenous Stopped 05/21/23 0117)  HYDROcodone-acetaminophen (NORCO/VICODIN) 5-325 MG per tablet 2 tablet (2 tablets Oral Given 05/21/23 0123)  levETIRAcetam (KEPPRA) tablet 1,000 mg (1,000 mg Oral Given 05/21/23 0202)  promethazine (PHENERGAN) tablet 25 mg (25 mg Oral Given 05/21/23 0227)     IMPRESSION / MDM / ASSESSMENT AND PLAN / ED COURSE  I reviewed the triage vital signs and the nursing notes.                              Differential diagnosis includes, but is not limited to, PNES, seizures, other nonepileptic spells, rigors, pain response, chronic abd pain, biliary colic, unlikely cholecystitis, obstruction  Patient's  presentation is most consistent with acute presentation with potential threat to life or bodily function.  The patient is on the cardiac monitor to evaluate for evidence of arrhythmia and/or significant heart rate changes  27 yo F here with seizure-like activity and abd pain. 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 Pt often is awake and remembers episodes with bialteral sx and they seem to correlate with stress. This has been noted previously. She is on keppra, which is reasonable to increase to 750 given her sx though low suspicion for primary epilepsy at this time. No focal neuro deficits. Re: her abd pain - this is also chronic in nature. WBC normal. LFts normal. Lipase normal. No signs of cholecystitis on U/S. She is requesting pain meds - she was given PO doses here but will not give IV or additional Rx as she needs to f/u with her pain clinic physician. She eloped after being told so.    FINAL CLINICAL IMPRESSION(S) / ED DIAGNOSES   Final diagnoses:  Chronic abdominal pain  Seizure-like activity (HCC)     Rx / DC Orders   ED Discharge Orders          Ordered    levETIRAcetam (KEPPRA) 750 MG tablet  2 times daily        05/21/23 0356             Note:  This document was prepared using Dragon voice recognition software and may include unintentional dictation errors.   Shaune Pollack, MD 05/21/23 [phone]

## 2023-05-21 NOTE — ED Notes (Signed)
Pt IV infiltrated and refuses to have another IV placed/ pt requesting PO meds

## 2023-06-07 ENCOUNTER — Other Ambulatory Visit: Payer: Self-pay | Admitting: Surgery

## 2023-06-07 DIAGNOSIS — G8929 Other chronic pain: Secondary | ICD-10-CM

## 2023-06-14 ENCOUNTER — Encounter
Admission: RE | Admit: 2023-06-14 | Discharge: 2023-06-14 | Disposition: A | Discharge status: Home / Self Care | Payer: 59 | Source: Ambulatory Visit | Attending: Surgery | Admitting: Surgery

## 2023-06-14 DIAGNOSIS — G8929 Other chronic pain: Secondary | ICD-10-CM | POA: Diagnosis present

## 2023-06-14 DIAGNOSIS — R1011 Right upper quadrant pain: Secondary | ICD-10-CM | POA: Diagnosis present

## 2023-06-14 MED ORDER — TECHNETIUM TC 99M MEBROFENIN IV KIT
5.0000 | PACK | Freq: Once | INTRAVENOUS | Status: AC
  Administered 2023-06-14: 4.81 via INTRAVENOUS

## 2023-06-17 ENCOUNTER — Encounter: Payer: Self-pay | Admitting: Emergency Medicine

## 2023-06-17 ENCOUNTER — Other Ambulatory Visit: Payer: Self-pay

## 2023-06-17 DIAGNOSIS — G8929 Other chronic pain: Secondary | ICD-10-CM | POA: Insufficient documentation

## 2023-06-17 DIAGNOSIS — R112 Nausea with vomiting, unspecified: Secondary | ICD-10-CM | POA: Insufficient documentation

## 2023-06-17 DIAGNOSIS — R197 Diarrhea, unspecified: Secondary | ICD-10-CM | POA: Insufficient documentation

## 2023-06-17 DIAGNOSIS — R1011 Right upper quadrant pain: Secondary | ICD-10-CM | POA: Diagnosis present

## 2023-06-17 LAB — COMPREHENSIVE METABOLIC PANEL
ALT: 21 U/L (ref 0–44)
AST: 26 U/L (ref 15–41)
Albumin: 4.9 g/dL (ref 3.5–5.0)
Alkaline Phosphatase: 55 U/L (ref 38–126)
Anion gap: 11 (ref 5–15)
BUN: 5 mg/dL — ABNORMAL LOW (ref 6–20)
CO2: 23 mmol/L (ref 22–32)
Calcium: 9.1 mg/dL (ref 8.9–10.3)
Chloride: 102 mmol/L (ref 98–111)
Creatinine, Ser: 0.69 mg/dL (ref 0.44–1.00)
GFR, Estimated: >60 mL/min (ref >60)
Glucose, Bld: 88 mg/dL (ref 70–99)
Potassium: 3.4 mmol/L — ABNORMAL LOW (ref 3.5–5.1)
Sodium: 136 mmol/L (ref 135–145)
Total Bilirubin: 1 mg/dL (ref 0.3–1.2)
Total Protein: 8.6 g/dL — ABNORMAL HIGH (ref 6.5–8.1)

## 2023-06-17 LAB — CBC
HCT: 47 % — ABNORMAL HIGH (ref 36.0–46.0)
Hemoglobin: 16.3 g/dL — ABNORMAL HIGH (ref 12.0–15.0)
MCH: 33.5 pg (ref 26.0–34.0)
MCHC: 34.7 g/dL (ref 30.0–36.0)
MCV: 96.5 fL (ref 80.0–100.0)
Platelets: 268 10*3/uL (ref 150–400)
RBC: 4.87 MIL/uL (ref 3.87–5.11)
RDW: 10.8 % — ABNORMAL LOW (ref 11.5–15.5)
WBC: 7.6 10*3/uL (ref 4.0–10.5)
nRBC: 0 % (ref 0.0–0.2)

## 2023-06-17 LAB — LIPASE, BLOOD: Lipase: 32 U/L (ref 11–51)

## 2023-06-17 NOTE — ED Triage Notes (Signed)
Pt presents ambulatory to triage via POV with complaints of gallbladder pain and was "sent here by Dr. Tonna Boehringer. Pt states she called him regarding the pain and instructed her to come the ED. She notes taking Oxycodone ~ 2 hours ago and had no improvement in her pain. A&Ox4 at this time. Denies CP or SOB.

## 2023-06-18 ENCOUNTER — Emergency Department: Payer: 59

## 2023-06-18 ENCOUNTER — Emergency Department
Admission: EM | Admit: 2023-06-18 | Discharge: 2023-06-18 | Disposition: A | Discharge status: Home / Self Care | Payer: 59 | Attending: Emergency Medicine | Admitting: Emergency Medicine

## 2023-06-18 DIAGNOSIS — G8929 Other chronic pain: Secondary | ICD-10-CM

## 2023-06-18 DIAGNOSIS — R1011 Right upper quadrant pain: Secondary | ICD-10-CM | POA: Diagnosis not present

## 2023-06-18 LAB — URINALYSIS, ROUTINE W REFLEX MICROSCOPIC
Bilirubin Urine: NEGATIVE
Glucose, UA: NEGATIVE mg/dL
Ketones, ur: NEGATIVE mg/dL
Leukocytes,Ua: NEGATIVE
Nitrite: NEGATIVE
Protein, ur: NEGATIVE mg/dL
Specific Gravity, Urine: 1.003 — ABNORMAL LOW (ref 1.005–1.030)
pH: 6 (ref 5.0–8.0)

## 2023-06-18 LAB — PREGNANCY, URINE: Preg Test, Ur: NEGATIVE

## 2023-06-18 MED ORDER — DICYCLOMINE HCL 20 MG PO TABS: 20.0000 mg | ORAL_TABLET | Freq: Three times a day (TID) | ORAL | 0 refills | Status: DC | PRN

## 2023-06-18 MED ORDER — ONDANSETRON 4 MG PO TBDP
4.0000 mg | ORAL_TABLET | Freq: Once | ORAL | Status: AC
  Administered 2023-06-18: 4 mg via ORAL
  Filled 2023-06-18: qty 1

## 2023-06-18 MED ORDER — ONDANSETRON 4 MG PO TBDP: 4.0000 mg | ORAL_TABLET | Freq: Four times a day (QID) | ORAL | 0 refills | Status: AC | PRN

## 2023-06-18 MED ORDER — DICYCLOMINE HCL 10 MG PO CAPS
20.0000 mg | ORAL_CAPSULE | Freq: Once | ORAL | Status: AC
  Administered 2023-06-18: 20 mg via ORAL
  Filled 2023-06-18: qty 2

## 2023-06-18 NOTE — ED Provider Notes (Signed)
[facility] Provider Note    Event Date/Time   First MD Initiated Contact with Patient 06/18/23 0106     (approximate)   History   Abdominal Pain   HPI  Hannah Shaw is a 27 y.o. female with history of endometriosis, ovarian cysts, gallstones who presents to the emergency department with chronic right upper quadrant abdominal pain worse after eating.  She reports nausea, vomiting and diarrhea.  No fevers.  No chest pain or shortness of breath.  Has had a HIDA scan on 06/14/2023 which was unremarkable.  Has also seen gastroenterology and had an endoscopy.  Last endoscopy in 2020 in our system showed gastritis without bleeding.  She is being followed by Dr. Tonna Boehringer with general surgery who does not feel that her symptoms are related to her gallbladder but she is here tonight stating that she is having increasing pain and requesting that her gallbladder be removed.   History provided by patient, significant other.    Past Medical History:  Diagnosis Date   Anxiety    Asthma    WELL CONTROLLED   Chronic kidney disease    H/O STONES   Endometriosis    GERD (gastroesophageal reflux disease)    Headache    History of kidney stones    History of ovarian cyst    Hypothyroidism    Miscarriage    Thyroid disease     Past Surgical History:  Procedure Laterality Date   APPENDECTOMY     COLONOSCOPY WITH PROPOFOL N/A 09/08/2019   Procedure: COLONOSCOPY WITH PROPOFOL;  Surgeon: Earline Mayotte, MD;  Location: [facility] ENDOSCOPY;  Service: Endoscopy;  Laterality: N/A;   DIAGNOSTIC LAPAROSCOPY     ENDOMETRIAL BIOPSY     ESOPHAGOGASTRODUODENOSCOPY (EGD) WITH PROPOFOL N/A 09/08/2019   Procedure: ESOPHAGOGASTRODUODENOSCOPY (EGD) WITH PROPOFOL;  Surgeon: Earline Mayotte, MD;  Location: [facility] ENDOSCOPY;  Service: Endoscopy;  Laterality: N/A;   LAPAROSCOPY N/A 03/13/2016   Procedure: LAPAROSCOPY DIAGNOSTIC;  Surgeon: Elenora Fender Deette Revak, MD;  Location: [facility] ORS;  Service:  Gynecology;  Laterality: N/A;    MEDICATIONS:  Prior to Admission medications   Medication Sig Start Date End Date Taking? Authorizing Provider  albuterol (VENTOLIN HFA) 108 (90 Base) MCG/ACT inhaler Inhale 2 puffs into the lungs every 4 (four) hours as needed for wheezing or shortness of breath. Patient not taking: Reported on 10/09/2022 05/14/19   Irean Hong, MD  diclofenac Sodium (VOLTAREN) 1 % GEL  02/22/20   [provider]  dicyclomine (BENTYL) 20 MG tablet Take 1 tablet (20 mg total) by mouth 3 (three) times daily before meals for 7 days. 01/28/20 02/04/20  Miguel Aschoff., MD  Fluoxetine HCl, PMDD, 10 MG TABS Take by mouth. Patient not taking: Reported on 10/09/2022    [provider]  Fluticasone-Umeclidin-Vilant (TRELEGY ELLIPTA) 100-62.5-25 MCG/INH AEPB Inhale 1 puff into the lungs daily. 07/24/21   Salena Saner, MD  gabapentin (NEURONTIN) 300 MG capsule Take 1 capsule by mouth 3 (three) times daily. 03/11/21   [provider]  ibuprofen (ADVIL) 600 MG tablet Take 600 mg by mouth every 6 (six) hours as needed. Patient not taking: Reported on 10/09/2022 02/10/20   [provider]  levETIRAcetam (KEPPRA) 750 MG tablet Take 1 tablet (750 mg total) by mouth 2 (two) times daily. TAKE INSTEAD OF YOUR 500 MG DOSE 05/21/23 06/20/23  Shaune Pollack, MD  levothyroxine (SYNTHROID, LEVOTHROID) 25 MCG tablet Take 25 mcg by mouth daily before breakfast.  [provider]  naproxen (NAPROSYN) 500 MG tablet Take 1 tablet (500 mg total) by mouth 2 (two) times daily with a meal. Patient not taking: Reported on 10/09/2022 02/06/20   Joni Reining, PA-C  norelgestromin-ethinyl estradiol Burr Medico) 150-35 MCG/24HR transdermal patch Place 1 patch onto the skin once a week. One patch weekly for 3 weeks, then one week off. Patient not taking: Reported on 10/09/2022 08/06/20   Nadara Mustard, MD  ondansetron (ZOFRAN ODT) 4 MG disintegrating tablet Take 1 tablet  (4 mg total) by mouth every 8 (eight) hours as needed for nausea or vomiting. 05/22/21   Merwyn Katos, MD  oxyCODONE-acetaminophen (PERCOCET/ROXICET) 5-325 MG tablet Take 1 tablet by mouth 3 (three) times daily as needed. 02/12/21   [provider]  pantoprazole (PROTONIX) 40 MG tablet Take 40 mg by mouth daily. Patient not taking: Reported on 10/09/2022 10/03/19   [provider]  prazosin (MINIPRESS) 1 MG capsule Take 1 mg by mouth 2 (two) times daily. Patient not taking: Reported on 10/09/2022 06/08/19   [provider]  Vitamin D, Ergocalciferol, (DRISDOL) 1.25 MG (50000 UNIT) CAPS capsule  03/04/20   [provider]    Physical Exam   Triage Vital Signs: ED Triage Vitals  Encounter Vitals Group     BP 06/17/23 2247 (!) 133/91     Systolic BP Percentile --      Diastolic BP Percentile --      Pulse Rate 06/17/23 2247 68     Resp 06/17/23 2247 18     Temp 06/17/23 2247 97.7 F (36.5 C)     Temp Source 06/17/23 2247 Oral     SpO2 06/17/23 2247 98 %     Weight 06/17/23 2241 95 lb (43.1 kg)     Height 06/17/23 2241 5\' 2"  (1.575 m)     Head Circumference --      Peak Flow --      Pain Score --      Pain Loc --      Pain Education --      Exclude from Growth Chart --     Most recent vital signs: Vitals:   06/17/23 2247  BP: (!) 133/91  Pulse: 68  Resp: 18  Temp: 97.7 F (36.5 C)  SpO2: 98%    CONSTITUTIONAL: Alert, responds appropriately to questions. Well-appearing; well-nourished HEAD: Normocephalic, atraumatic EYES: Conjunctivae clear, pupils appear equal, sclera nonicteric ENT: normal nose; moist mucous membranes NECK: Supple, normal ROM CARD: RRR; S1 and S2 appreciated RESP: Normal chest excursion without splinting or tachypnea; breath sounds clear and equal bilaterally; no wheezes, no rhonchi, no rales, no hypoxia or respiratory distress, speaking full sentences ABD/GI: Non-distended; soft, non-tender, no rebound, no guarding,  no peritoneal signs BACK: The back appears normal EXT: Normal ROM in all joints; no deformity noted, no edema SKIN: Normal color for age and race; warm; no rash on exposed skin NEURO: Moves all extremities equally, normal speech PSYCH: The patient's mood and manner are appropriate.   ED Results / Procedures / Treatments   LABS: (all labs ordered are listed, but only abnormal results are displayed) Labs Reviewed  COMPREHENSIVE METABOLIC PANEL - Abnormal; Notable for the following components:      Result Value   Potassium 3.4 (*)    BUN 5 (*)    Total Protein 8.6 (*)    All other components within normal limits  CBC - Abnormal; Notable for the following components:   Hemoglobin 16.3 (*)  HCT 47.0 (*)    RDW 10.8 (*)    All other components within normal limits  URINALYSIS, ROUTINE W REFLEX MICROSCOPIC - Abnormal; Notable for the following components:   Color, Urine STRAW (*)    APPearance CLEAR (*)    Specific Gravity, Urine 1.003 (*)    Hgb urine dipstick SMALL (*)    Bacteria, UA RARE (*)    All other components within normal limits  LIPASE, BLOOD  PREGNANCY, URINE     EKG:   RADIOLOGY: My personal review and interpretation of imaging: Right upper quad ultrasound shows gallbladder sludge but no other acute abnormality.  I have personally reviewed all radiology reports.   US ABDOMEN LIMITED RUQ (LIVER/GB)  Result Date: 06/18/2023 CLINICAL DATA:  Upper abdominal pain EXAM: ULTRASOUND ABDOMEN LIMITED RIGHT UPPER QUADRANT COMPARISON:  05/21/2023 FINDINGS: Gallbladder: Layering echogenic sludge is seen within the gallbladder. The gallbladder, however, is not distended, there is no gallbladder wall thickening, and no pericholecystic fluid is identified. The sonographic Eulah Pont sign is reportedly negative. Common bile duct: Diameter: 3 mm in proximal diameter Liver: Hepatic parenchymal echogenicity is diffusely increased in keeping with mild to moderate hepatic steatosis. No  focal intrahepatic masses are seen and there is no intrahepatic biliary ductal dilation. Portal vein is patent on color Doppler imaging with normal direction of blood flow towards the liver. Other: None. IMPRESSION: 1. Gallbladder sludge. No sonographic evidence of acute cholecystitis. 2. Hepatic steatosis. Electronically Signed   By: Helyn Numbers M.D.   On: 06/18/2023 01:07     PROCEDURES:  Critical Care performed:      Procedures    IMPRESSION / MDM / ASSESSMENT AND PLAN / ED COURSE  I reviewed the triage vital signs and the nursing notes.    Patient here with complaints of chronic right upper quadrant abdominal pain.    DIFFERENTIAL DIAGNOSIS (includes but not limited to):   Gastritis, chronic pain, cholelithiasis, cholangitis, choledocholithiasis, cholecystitis, pancreatitis, GERD   Patient's presentation is most consistent with acute complicated illness / injury requiring diagnostic workup.   PLAN: Patient's labs ordered from triage.  No leukocytosis, normal LFTs and lipase.  Right upper quadrant ultrasound reviewed and interpreted by myself and the radiologist and shows gallbladder sludge without cholelithiasis, cholecystitis.  She has also had multiple CTs of her abdomen pelvis that have not shown any acute abnormality.  Discussed with patient and significant other that I worry about risk of radiation exposure for repeat CT today especially for chronic pain.  They agree on holding off.  Discussed with patient that there is no indication that she needs emergent cholecystectomy at this time.  She is well-appearing, well-hydrated here.  Will obtain urinalysis.  Will give pain and nausea medicine.  She is already seeing pain management and getting 120 oxycodone every month.   MEDICATIONS GIVEN IN ED: Medications  dicyclomine (BENTYL) capsule 20 mg (20 mg Oral Given 06/18/23 0138)  ondansetron (ZOFRAN-ODT) disintegrating tablet 4 mg (4 mg Oral Given 06/18/23 0138)     ED  COURSE: Urine showed no sign of infection, blood.  Pregnancy test negative.  Will discharge home with Bentyl, Zofran.  Recommended follow-up with GI and general surgery.   At this time, I do not feel there is any life-threatening condition present. I reviewed all nursing notes, vitals, pertinent previous records.  All lab and urine results, EKGs, imaging ordered have been independently reviewed and interpreted by myself.  I reviewed all available radiology reports from any imaging ordered this  visit.  Based on my assessment, I feel the patient is safe to be discharged home without further emergent workup and can continue workup as an outpatient as needed. Discussed all findings, treatment plan as well as usual and customary return precautions.  They verbalize understanding and are comfortable with this plan.  Outpatient follow-up has been provided as needed.  All questions have been answered.    CONSULTS: None   OUTSIDE RECORDS REVIEWED: Reviewed recent general surgery notes.       FINAL CLINICAL IMPRESSION(S) / ED DIAGNOSES   Final diagnoses:  Chronic abdominal pain     Rx / DC Orders   ED Discharge Orders          Ordered    dicyclomine (BENTYL) 20 MG tablet  Every 8 hours PRN        06/18/23 0159    ondansetron (ZOFRAN-ODT) 4 MG disintegrating tablet  Every 6 hours PRN        06/18/23 0159             Note:  This document was prepared using Dragon voice recognition software and may include unintentional dictation errors.   Jerrica Thorman, Layla Maw, DO 06/18/23 0200

## 2023-06-18 NOTE — ED Notes (Signed)
Patient discharged at this time. Ambulated to lobby with independent and steady gait. Breathing unlabored speaking in full sentences. Verbalized understanding of all discharge, follow up, and medication teaching. Discharged homed with all belongings.   

## 2023-06-18 NOTE — Discharge Instructions (Addendum)
Please follow-up with gastroenterology and general surgery as an outpatient.

## 2023-06-25 ENCOUNTER — Ambulatory Visit: Payer: Self-pay | Admitting: Surgery

## 2023-06-25 ENCOUNTER — Other Ambulatory Visit: Payer: Self-pay

## 2023-06-25 ENCOUNTER — Encounter
Admission: RE | Admit: 2023-06-25 | Discharge: 2023-06-25 | Disposition: A | Discharge status: Home / Self Care | Payer: 59 | Source: Ambulatory Visit | Attending: Surgery | Admitting: Surgery

## 2023-06-25 DIAGNOSIS — Z01812 Encounter for preprocedural laboratory examination: Secondary | ICD-10-CM

## 2023-06-25 HISTORY — DX: Depression, unspecified: F32.A

## 2023-06-25 HISTORY — DX: Constipation, unspecified: K59.00

## 2023-06-25 HISTORY — DX: Other chronic pain: G89.29

## 2023-06-25 HISTORY — DX: Apraxia: R48.2

## 2023-06-25 NOTE — Patient Instructions (Addendum)
Your procedure is scheduled on: 07/01/23 - Thursday Report to the Registration Desk on the 1st floor of the [facility]. To find out your arrival time, please call [phone] between 1PM - 3PM on: 06/30/23 - Wednesday If your arrival time is 6:00 am, do not arrive before that time as the [facility] entrance doors do not open until 6:00 am.  REMEMBER: Instructions that are not followed completely may result in serious medical risk, up to and including death; or upon the discretion of your surgeon and anesthesiologist your surgery may need to be rescheduled.  Do not eat food after midnight the night before surgery.  No gum chewing or hard candies.  You may however, drink CLEAR liquids up to 2 hours before you are scheduled to arrive for your surgery. Do not drink anything within 2 hours of your scheduled arrival time.  Clear liquids include: - water  - apple juice without pulp - gatorade (not RED colors) - black coffee or tea (Do NOT add milk or creamers to the coffee or tea) Do NOT drink anything that is not on this list.   One week prior to surgery: Stop Anti-inflammatories (NSAIDS) such as Advil, Aleve, Ibuprofen, Motrin, Naproxen, Naprosyn and Aspirin based products such as Excedrin, Goody's Powder, BC Powder. You may however, continue to take Tylenol if needed for pain up until the day of surgery.  Stop ANY OVER THE COUNTER supplements until after surgery.   TAKE ONLY THESE MEDICATIONS THE MORNING OF SURGERY WITH A SIP OF WATER:  TRELEGY ELLIPTA  gabapentin (NEURONTIN)  oxyCODONE-acetaminophen     No Alcohol for 24 hours before or after surgery.  No Smoking including e-cigarettes for 24 hours before surgery.  No chewable tobacco products for at least 6 hours before surgery.  No nicotine patches on the day of surgery.  Do not use any "recreational" drugs for at least a week (preferably 2 weeks) before your surgery.  Please be advised that the combination of cocaine  and anesthesia may have negative outcomes, up to and including death. If you test positive for cocaine, your surgery will be cancelled.  On the morning of surgery brush your teeth with toothpaste and water, you may rinse your mouth with mouthwash if you wish. Do not swallow any toothpaste or mouthwash.  Use CHG Soap or wipes as directed on instruction sheet.  Do not wear jewelry, make-up, hairpins, clips or nail polish.  Do not wear lotions, powders, or perfumes.   Do not shave body hair from the neck down 48 hours before surgery.  Contact lenses, hearing aids and dentures may not be worn into surgery.  Do not bring valuables to the hospital. [facility] is not responsible for any missing/lost belongings or valuables.   Notify your doctor if there is any change in your medical condition (cold, fever, infection).  Wear comfortable clothing (specific to your surgery type) to the hospital.  After surgery, you can help prevent lung complications by doing breathing exercises.  Take deep breaths and cough every 1-2 hours. Your doctor may order a device called an Incentive Spirometer to help you take deep breaths. When coughing or sneezing, hold a pillow firmly against your incision with both hands. This is called "splinting." Doing this helps protect your incision. It also decreases belly discomfort.  If you are being admitted to the hospital overnight, leave your suitcase in the car. After surgery it may be brought to your room.  In case of increased patient census,  it may be necessary for you, the patient, to continue your postoperative care in the Same Day Surgery department.  If you are being discharged the day of surgery, you will not be allowed to drive home. You will need a responsible individual to drive you home and stay with you for 24 hours after surgery.   If you are taking public transportation, you will need to have a responsible individual with you.  Please call the  Pre-admissions Testing Dept. at [phone] if you have any questions about these instructions.  Surgery Visitation Policy:  Patients having surgery or a procedure may have two visitors.  Children under the age of 52 must have an adult with them who is not the patient.  Inpatient Visitation:    Visiting hours are 7 a.m. to 8 p.m. Up to four visitors are allowed at one time in a patient room. The visitors may rotate out with other people during the day.  One visitor age 64 or older may stay with the patient overnight and must be in the room by 8 p.m.

## 2023-06-25 NOTE — H&P (Signed)
Subjective:   CC: Chronic RUQ pain [R10.11, G89.29]  HPI: Hannah Shaw is a 27 y.o. female who was referred by [facility] for evaluation of above CC. Symptoms were first noted 6 months ago. Constant pain with nausea. Hx of chronic pain elsewhere, in abdomen. but seems to have improved with pain management. Also has hx of endometriosis, s/p resection.  Assciated with episodes of seizure like activity, supposedly triggered by the pain reported above. No definitve EEG dx made, and no neurology appt made.  Past Medical History: has a past medical history of Anxiety, Asthma without status asthmaticus, Chronic kidney disease, Chronic pelvic pain in female, Endometriosis, GERD (gastroesophageal reflux disease), HA (headache), ovarian cyst, and Thyroid disease.  Past Surgical History: has a past surgical history that includes Appendectomy; Endometrial Biopsy; Diagnostic laparoscopy (03/13/2016); egd (09/08/2019); and Colonoscopy (09/08/2019).  Family History: family history includes COPD in her father; Heart failure in her mother; High blood pressure (Hypertension) in her father and mother; Stroke in her father.  Social History: reports that she has been smoking cigarettes. She has never used smokeless tobacco. She reports that she does not drink alcohol and does not use drugs.  Current Medications: has a current medication list which includes the following prescription(s): acetaminophen, famotidine, trelegy ellipta, gabapentin, ibuprofen, ondansetron, and oxycodone-acetaminophen.  Allergies:  Allergies as of 06/01/2023 - Reviewed 06/01/2023  Allergen Reaction Noted  Povidone-iodine Rash and Shortness Of Breath 09/22/2012  Amitriptyline Other (See Comments) 02/12/2020  Betadine [povidone-iodine (with soap)] Unknown 11/06/2015  Buprenorphine-naloxone Other (See Comments) 12/11/2021  Bupropion hcl Other (See Comments) 12/11/2021  Duloxetine Other (See Comments) 12/11/2021  Escitalopram Other (See  Comments) 12/11/2021  Fentanyl Unknown 11/06/2015  Lactase Other (See Comments) 12/11/2021  Medroxyprogesterone Other (See Comments) 12/11/2021  Mirtazapine Other (See Comments) 12/11/2021  Morphine Unknown 11/06/2015  Norelgestromin-ethin.estradiol Other (See Comments) 12/11/2021  Norethindrone Other (See Comments) 12/11/2021  Prazosin Other (See Comments) 12/11/2021  Seroquel [quetiapine] Other (See Comments) 11/08/2019  Venlafaxine Other (See Comments) 12/11/2021  Zoloft [sertraline] Vomiting 11/10/2019   ROS:  A 15 point review of systems was performed and pertinent positives and negatives noted in HPI   Objective:    BP 102/78  Pulse 88  Ht 157.5 cm (5\' 2" )  Wt (!) 43.8 kg (96 lb 9.6 oz)  LMP 06/01/2023  BMI 17.67 kg/m   Constitutional : No distress, cooperative, alert  Lymphatics/Throat: Supple with no lymphadenopathy  Respiratory: Clear to auscultation bilaterally  Cardiovascular: Regular rate and rhythm  Gastrointestinal: Soft, slight TTP all four quadrants.  Musculoskeletal: Steady gait and movement  Skin: Cool and moist  Psychiatric: Normal affect, non-agitated, not confused    LABS:  N/a   RADS: CLINICAL DATA: Right upper quadrant pain   EXAM:  ULTRASOUND ABDOMEN LIMITED RIGHT UPPER QUADRANT   COMPARISON: CT 01/26/2023   FINDINGS:  Gallbladder:   Multiple small stones layering within the gallbladder. No wall  thickening or sonographic Murphy sign. No pericholecystic fluid.   Common bile duct:   Diameter: Normal caliber, 4 mm.   Liver:   Increased echotexture compatible with fatty infiltration. No focal  abnormality or biliary ductal dilatation. Portal vein is patent on  color Doppler imaging with normal direction of blood flow towards  the liver.   Other: None.   IMPRESSION:  Cholelithiasis. No sonographic evidence of acute cholecystitis.   Fatty liver.   Electronically Signed  By: Charlett Nose M.D.  On: 05/21/2023 02:02   Assessment:    Chronic RUQ pain [R10.11, G89.29]  Plan:  1. Chronic RUQ pain [R10.11, G89.29] will proceed with HIDA scan to evaluate further since history is not typical for gallbladder pain.  HIDA f/u negative as well. Pt still has persistent pain,  at this point, willing to proceed with surgery understanding no guarantees of pain resolution, possible complications and pain from surgery as well.  Discussed case with her pain management office, and they will follow regarding post op pain management after acute period. Again, emphasized low liklihood of symptoms resolution.  Pt, mother, and father all verbalized multiple times understanding and still wishes to proceed.   labs/images/medications/previous chart entries reviewed personally and relevant changes/updates noted above.

## 2023-06-25 NOTE — H&P (View-Only) (Signed)
Subjective:   CC: Chronic RUQ pain [R10.11, G89.29]  HPI: Hannah Shaw is a 27 y.o. female who was referred by [facility] for evaluation of above CC. Symptoms were first noted 6 months ago. Constant pain with nausea. Hx of chronic pain elsewhere, in abdomen. but seems to have improved with pain management. Also has hx of endometriosis, s/p resection.  Assciated with episodes of seizure like activity, supposedly triggered by the pain reported above. No definitve EEG dx made, and no neurology appt made.  Past Medical History: has a past medical history of Anxiety, Asthma without status asthmaticus, Chronic kidney disease, Chronic pelvic pain in female, Endometriosis, GERD (gastroesophageal reflux disease), HA (headache), ovarian cyst, and Thyroid disease.  Past Surgical History: has a past surgical history that includes Appendectomy; Endometrial Biopsy; Diagnostic laparoscopy (03/13/2016); egd (09/08/2019); and Colonoscopy (09/08/2019).  Family History: family history includes COPD in her father; Heart failure in her mother; High blood pressure (Hypertension) in her father and mother; Stroke in her father.  Social History: reports that she has been smoking cigarettes. She has never used smokeless tobacco. She reports that she does not drink alcohol and does not use drugs.  Current Medications: has a current medication list which includes the following prescription(s): acetaminophen, famotidine, trelegy ellipta, gabapentin, ibuprofen, ondansetron, and oxycodone-acetaminophen.  Allergies:  Allergies as of 06/01/2023 - Reviewed 06/01/2023  Allergen Reaction Noted  Povidone-iodine Rash and Shortness Of Breath 09/22/2012  Amitriptyline Other (See Comments) 02/12/2020  Betadine [povidone-iodine (with soap)] Unknown 11/06/2015  Buprenorphine-naloxone Other (See Comments) 12/11/2021  Bupropion hcl Other (See Comments) 12/11/2021  Duloxetine Other (See Comments) 12/11/2021  Escitalopram Other (See  Comments) 12/11/2021  Fentanyl Unknown 11/06/2015  Lactase Other (See Comments) 12/11/2021  Medroxyprogesterone Other (See Comments) 12/11/2021  Mirtazapine Other (See Comments) 12/11/2021  Morphine Unknown 11/06/2015  Norelgestromin-ethin.estradiol Other (See Comments) 12/11/2021  Norethindrone Other (See Comments) 12/11/2021  Prazosin Other (See Comments) 12/11/2021  Seroquel [quetiapine] Other (See Comments) 11/08/2019  Venlafaxine Other (See Comments) 12/11/2021  Zoloft [sertraline] Vomiting 11/10/2019   ROS:  A 15 point review of systems was performed and pertinent positives and negatives noted in HPI   Objective:    BP 102/78  Pulse 88  Ht 157.5 cm (5\' 2" )  Wt (!) 43.8 kg (96 lb 9.6 oz)  LMP 06/01/2023  BMI 17.67 kg/m   Constitutional : No distress, cooperative, alert  Lymphatics/Throat: Supple with no lymphadenopathy  Respiratory: Clear to auscultation bilaterally  Cardiovascular: Regular rate and rhythm  Gastrointestinal: Soft, slight TTP all four quadrants.  Musculoskeletal: Steady gait and movement  Skin: Cool and moist  Psychiatric: Normal affect, non-agitated, not confused    LABS:  N/a   RADS: CLINICAL DATA: Right upper quadrant pain   EXAM:  ULTRASOUND ABDOMEN LIMITED RIGHT UPPER QUADRANT   COMPARISON: CT 01/26/2023   FINDINGS:  Gallbladder:   Multiple small stones layering within the gallbladder. No wall  thickening or sonographic Murphy sign. No pericholecystic fluid.   Common bile duct:   Diameter: Normal caliber, 4 mm.   Liver:   Increased echotexture compatible with fatty infiltration. No focal  abnormality or biliary ductal dilatation. Portal vein is patent on  color Doppler imaging with normal direction of blood flow towards  the liver.   Other: None.   IMPRESSION:  Cholelithiasis. No sonographic evidence of acute cholecystitis.   Fatty liver.   Electronically Signed  By: Charlett Nose M.D.  On: 05/21/2023 02:02   Assessment:    Chronic RUQ pain [R10.11, G89.29]  Plan:  1. Chronic RUQ pain [R10.11, G89.29] will proceed with HIDA scan to evaluate further since history is not typical for gallbladder pain.  HIDA f/u negative as well. Pt still has persistent pain,  at this point, willing to proceed with surgery understanding no guarantees of pain resolution, possible complications and pain from surgery as well.  Discussed case with her pain management office, and they will follow regarding post op pain management after acute period. Again, emphasized low liklihood of symptoms resolution.  Pt, mother, and father all verbalized multiple times understanding and still wishes to proceed.   labs/images/medications/previous chart entries reviewed personally and relevant changes/updates noted above.

## 2023-07-01 ENCOUNTER — Ambulatory Visit
Admission: RE | Admit: 2023-07-01 | Discharge: 2023-07-01 | Disposition: A | Discharge status: Home / Self Care | Payer: 59 | Attending: Surgery | Admitting: Surgery

## 2023-07-01 ENCOUNTER — Other Ambulatory Visit: Payer: Self-pay

## 2023-07-01 ENCOUNTER — Ambulatory Visit: Payer: 59 | Admitting: General Practice

## 2023-07-01 ENCOUNTER — Encounter
Admission: RE | Disposition: A | Discharge status: Home / Self Care | Payer: Self-pay | Source: Home / Self Care | Attending: Surgery

## 2023-07-01 ENCOUNTER — Encounter: Payer: Self-pay | Admitting: Surgery

## 2023-07-01 DIAGNOSIS — K219 Gastro-esophageal reflux disease without esophagitis: Secondary | ICD-10-CM | POA: Diagnosis not present

## 2023-07-01 DIAGNOSIS — K8020 Calculus of gallbladder without cholecystitis without obstruction: Secondary | ICD-10-CM

## 2023-07-01 DIAGNOSIS — J45909 Unspecified asthma, uncomplicated: Secondary | ICD-10-CM | POA: Diagnosis not present

## 2023-07-01 DIAGNOSIS — Z01812 Encounter for preprocedural laboratory examination: Secondary | ICD-10-CM

## 2023-07-01 DIAGNOSIS — K8010 Calculus of gallbladder with chronic cholecystitis without obstruction: Secondary | ICD-10-CM | POA: Insufficient documentation

## 2023-07-01 DIAGNOSIS — K760 Fatty (change of) liver, not elsewhere classified: Secondary | ICD-10-CM | POA: Insufficient documentation

## 2023-07-01 DIAGNOSIS — N189 Chronic kidney disease, unspecified: Secondary | ICD-10-CM | POA: Diagnosis not present

## 2023-07-01 DIAGNOSIS — G8929 Other chronic pain: Secondary | ICD-10-CM | POA: Insufficient documentation

## 2023-07-01 DIAGNOSIS — F17210 Nicotine dependence, cigarettes, uncomplicated: Secondary | ICD-10-CM | POA: Diagnosis not present

## 2023-07-01 DIAGNOSIS — E039 Hypothyroidism, unspecified: Secondary | ICD-10-CM | POA: Diagnosis not present

## 2023-07-01 LAB — POCT PREGNANCY, URINE: Preg Test, Ur: NEGATIVE

## 2023-07-01 SURGERY — CHOLECYSTECTOMY, ROBOT-ASSISTED, LAPAROSCOPIC
Anesthesia: General | Site: Abdomen

## 2023-07-01 MED ORDER — SUCCINYLCHOLINE CHLORIDE 200 MG/10ML IV SOSY
PREFILLED_SYRINGE | INTRAVENOUS | Status: DC | PRN
  Administered 2023-07-01: 60 mg via INTRAVENOUS

## 2023-07-01 MED ORDER — ROCURONIUM BROMIDE 100 MG/10ML IV SOLN
INTRAVENOUS | Status: DC | PRN
  Administered 2023-07-01: 30 mg via INTRAVENOUS

## 2023-07-01 MED ORDER — INDOCYANINE GREEN 25 MG IV SOLR
1.2500 mg | Freq: Once | INTRAVENOUS | Status: AC
  Administered 2023-07-01: 1.25 mg via INTRAVENOUS

## 2023-07-01 MED ORDER — HYDROMORPHONE HCL 1 MG/ML IJ SOLN
INTRAMUSCULAR | Status: DC | PRN
  Administered 2023-07-01: .5 mg via INTRAVENOUS

## 2023-07-01 MED ORDER — INDOCYANINE GREEN 25 MG IV SOLR
INTRAVENOUS | Status: AC
  Filled 2023-07-01: qty 10

## 2023-07-01 MED ORDER — LIDOCAINE HCL (CARDIAC) PF 100 MG/5ML IV SOSY
PREFILLED_SYRINGE | INTRAVENOUS | Status: DC | PRN
  Administered 2023-07-01: 40 mg via INTRAVENOUS

## 2023-07-01 MED ORDER — OXYCODONE HCL 5 MG PO TABS
5.0000 mg | ORAL_TABLET | Freq: Once | ORAL | Status: AC | PRN
  Administered 2023-07-01: 5 mg via ORAL

## 2023-07-01 MED ORDER — PROPOFOL 10 MG/ML IV BOLUS
INTRAVENOUS | Status: DC | PRN
  Administered 2023-07-01: 120 mg via INTRAVENOUS

## 2023-07-01 MED ORDER — DEXAMETHASONE SODIUM PHOSPHATE 10 MG/ML IJ SOLN
INTRAMUSCULAR | Status: AC
  Filled 2023-07-01: qty 1

## 2023-07-01 MED ORDER — CEFAZOLIN SODIUM-DEXTROSE 2-4 GM/100ML-% IV SOLN
INTRAVENOUS | Status: AC
  Filled 2023-07-01: qty 100

## 2023-07-01 MED ORDER — ONDANSETRON HCL 4 MG/2ML IJ SOLN
INTRAMUSCULAR | Status: DC | PRN
  Administered 2023-07-01: 4 mg via INTRAVENOUS

## 2023-07-01 MED ORDER — ONDANSETRON HCL 4 MG/2ML IJ SOLN
INTRAMUSCULAR | Status: AC
  Filled 2023-07-01: qty 2

## 2023-07-01 MED ORDER — PROPOFOL 10 MG/ML IV BOLUS
INTRAVENOUS | Status: AC
  Filled 2023-07-01: qty 20

## 2023-07-01 MED ORDER — HYDROMORPHONE HCL 1 MG/ML IJ SOLN
INTRAMUSCULAR | Status: AC
  Filled 2023-07-01: qty 1

## 2023-07-01 MED ORDER — ORAL CARE MOUTH RINSE: 15.0000 mL | Freq: Once | OROMUCOSAL | Status: AC

## 2023-07-01 MED ORDER — FAMOTIDINE 20 MG PO TABS
20.0000 mg | ORAL_TABLET | Freq: Once | ORAL | Status: AC
  Administered 2023-07-01: 20 mg via ORAL

## 2023-07-01 MED ORDER — CHLORHEXIDINE GLUCONATE 0.12 % MT SOLN
15.0000 mL | Freq: Once | OROMUCOSAL | Status: AC
  Administered 2023-07-01: 15 mL via OROMUCOSAL

## 2023-07-01 MED ORDER — FAMOTIDINE 20 MG PO TABS
ORAL_TABLET | ORAL | Status: AC
  Filled 2023-07-01: qty 1

## 2023-07-01 MED ORDER — LACTATED RINGERS IV SOLN: INTRAVENOUS | Status: DC

## 2023-07-01 MED ORDER — LIDOCAINE HCL (PF) 2 % IJ SOLN
INTRAMUSCULAR | Status: AC
  Filled 2023-07-01: qty 5

## 2023-07-01 MED ORDER — 0.9 % SODIUM CHLORIDE (POUR BTL) OPTIME
TOPICAL | Status: DC | PRN
  Administered 2023-07-01: 500 mL

## 2023-07-01 MED ORDER — CHLORHEXIDINE GLUCONATE 0.12 % MT SOLN
OROMUCOSAL | Status: AC
  Filled 2023-07-01: qty 15

## 2023-07-01 MED ORDER — FENTANYL CITRATE (PF) 100 MCG/2ML IJ SOLN
INTRAMUSCULAR | Status: AC
  Filled 2023-07-01: qty 2

## 2023-07-01 MED ORDER — FENTANYL CITRATE (PF) 100 MCG/2ML IJ SOLN
25.0000 ug | INTRAMUSCULAR | Status: DC | PRN
  Administered 2023-07-01 (×4): 25 ug via INTRAVENOUS

## 2023-07-01 MED ORDER — OXYCODONE-ACETAMINOPHEN 7.5-325 MG PO TABS: 1.0000 | ORAL_TABLET | Freq: Four times a day (QID) | ORAL | 0 refills | Status: AC | PRN

## 2023-07-01 MED ORDER — ROCURONIUM BROMIDE 10 MG/ML (PF) SYRINGE
PREFILLED_SYRINGE | INTRAVENOUS | Status: AC
  Filled 2023-07-01: qty 10

## 2023-07-01 MED ORDER — OXYCODONE HCL 5 MG/5ML PO SOLN: 5.0000 mg | Freq: Once | ORAL | Status: AC | PRN

## 2023-07-01 MED ORDER — LIDOCAINE-EPINEPHRINE (PF) 1 %-1:200000 IJ SOLN
INTRAMUSCULAR | Status: DC | PRN
  Administered 2023-07-01: 24 mL via INTRAMUSCULAR

## 2023-07-01 MED ORDER — DOCUSATE SODIUM 100 MG PO CAPS: 100.0000 mg | ORAL_CAPSULE | Freq: Two times a day (BID) | ORAL | 0 refills | Status: AC | PRN

## 2023-07-01 MED ORDER — DIPHENHYDRAMINE HCL 50 MG/ML IJ SOLN
INTRAMUSCULAR | Status: DC | PRN
Start: 2023-07-01 — End: 2023-07-01
  Administered 2023-07-01: 25 mg via INTRAVENOUS

## 2023-07-01 MED ORDER — CEFAZOLIN SODIUM-DEXTROSE 2-4 GM/100ML-% IV SOLN
2.0000 g | INTRAVENOUS | Status: AC
  Administered 2023-07-01: 2 g via INTRAVENOUS

## 2023-07-01 MED ORDER — SUGAMMADEX SODIUM 200 MG/2ML IV SOLN
INTRAVENOUS | Status: DC | PRN
  Administered 2023-07-01: 100 mg via INTRAVENOUS

## 2023-07-01 MED ORDER — OXYCODONE HCL 5 MG PO TABS
ORAL_TABLET | ORAL | Status: AC
  Filled 2023-07-01: qty 1

## 2023-07-01 MED ORDER — BUPIVACAINE HCL (PF) 0.5 % IJ SOLN
INTRAMUSCULAR | Status: AC
  Filled 2023-07-01: qty 30

## 2023-07-01 MED ORDER — LIDOCAINE-EPINEPHRINE (PF) 1 %-1:200000 IJ SOLN
INTRAMUSCULAR | Status: AC
  Filled 2023-07-01: qty 30

## 2023-07-01 MED ORDER — MIDAZOLAM HCL 2 MG/2ML IJ SOLN
INTRAMUSCULAR | Status: DC | PRN
  Administered 2023-07-01: 2 mg via INTRAVENOUS

## 2023-07-01 MED ORDER — MIDAZOLAM HCL 2 MG/2ML IJ SOLN
INTRAMUSCULAR | Status: AC
  Filled 2023-07-01: qty 2

## 2023-07-01 MED ORDER — FENTANYL CITRATE (PF) 100 MCG/2ML IJ SOLN
INTRAMUSCULAR | Status: DC | PRN
  Administered 2023-07-01 (×2): 25 ug via INTRAVENOUS

## 2023-07-01 MED ORDER — DEXAMETHASONE SODIUM PHOSPHATE 10 MG/ML IJ SOLN
INTRAMUSCULAR | Status: DC | PRN
  Administered 2023-07-01: 10 mg via INTRAVENOUS

## 2023-07-01 MED ORDER — CHLORHEXIDINE GLUCONATE CLOTH 2 % EX PADS
6.0000 | MEDICATED_PAD | Freq: Once | CUTANEOUS | Status: AC
  Administered 2023-07-01: 6 via TOPICAL

## 2023-07-01 MED ORDER — FENTANYL CITRATE PF 50 MCG/ML IJ SOSY: 25.0000 ug | PREFILLED_SYRINGE | INTRAMUSCULAR | Status: DC | PRN

## 2023-07-01 SURGICAL SUPPLY — 47 items
ADH SKN CLS APL DERMABOND .7 (GAUZE/BANDAGES/DRESSINGS) ×1
ANCHOR TIS RET SYS 235ML (MISCELLANEOUS) ×1 IMPLANT
BAG PRESSURE INF REUSE 1000 (BAG) IMPLANT
BAG TISS RTRVL C235 10X14 (MISCELLANEOUS) ×1
BLADE SURG SZ11 CARB STEEL (BLADE) ×1 IMPLANT
CATH REDDICK CHOLANGI 4FR 50CM (CATHETERS) IMPLANT
CAUTERY HOOK MNPLR 1.6 DVNC XI (INSTRUMENTS) ×1 IMPLANT
CLIP LIGATING HEMO O LOK GREEN (MISCELLANEOUS) ×1 IMPLANT
DERMABOND ADVANCED .7 DNX12 (GAUZE/BANDAGES/DRESSINGS) ×1 IMPLANT
DRAPE ARM DVNC X/XI (DISPOSABLE) ×4 IMPLANT
DRAPE COLUMN DVNC XI (DISPOSABLE) ×1 IMPLANT
ELECT CAUTERY BLADE 6.4 (BLADE) ×1 IMPLANT
ELECT REM PT RETURN 9FT ADLT (ELECTROSURGICAL) ×1
ELECTRODE REM PT RTRN 9FT ADLT (ELECTROSURGICAL) ×1 IMPLANT
FORCEPS BPLR FENES DVNC XI (FORCEP) ×1 IMPLANT
FORCEPS PROGRASP DVNC XI (FORCEP) ×1 IMPLANT
GLOVE BIOGEL PI IND STRL 7.0 (GLOVE) ×2 IMPLANT
GLOVE SURG SYN 6.5 ES PF (GLOVE) ×2 IMPLANT
GLOVE SURG SYN 6.5 PF PI (GLOVE) ×2 IMPLANT
GOWN STRL REUS W/ TWL LRG LVL3 (GOWN DISPOSABLE) ×3 IMPLANT
GOWN STRL REUS W/TWL LRG LVL3 (GOWN DISPOSABLE) ×3
GRASPER SUT TROCAR 14GX15 (MISCELLANEOUS) IMPLANT
KIT TURNOVER KIT A (KITS) ×1 IMPLANT
LABEL OR SOLS (LABEL) ×1 IMPLANT
MANIFOLD NEPTUNE II (INSTRUMENTS) ×1 IMPLANT
NDL HYPO 22X1.5 SAFETY MO (MISCELLANEOUS) ×1 IMPLANT
NDL INSUFFLATION 14GA 120MM (NEEDLE) ×1 IMPLANT
NEEDLE HYPO 22X1.5 SAFETY MO (MISCELLANEOUS) ×1 IMPLANT
NEEDLE INSUFFLATION 14GA 120MM (NEEDLE) ×1 IMPLANT
NS IRRIG 500ML POUR BTL (IV SOLUTION) ×1 IMPLANT
OBTURATOR OPTICAL STND 8 DVNC (TROCAR) ×1
OBTURATOR OPTICALSTD 8 DVNC (TROCAR) ×1 IMPLANT
PACK LAP CHOLECYSTECTOMY (MISCELLANEOUS) ×1 IMPLANT
PENCIL SMOKE EVACUATOR (MISCELLANEOUS) ×1 IMPLANT
SEAL UNIV 5-12 XI (MISCELLANEOUS) ×4 IMPLANT
SET TUBE SMOKE EVAC HIGH FLOW (TUBING) ×1 IMPLANT
SOL ELECTROSURG ANTI STICK (MISCELLANEOUS) ×1
SOLUTION ELECTROSURG ANTI STCK (MISCELLANEOUS) ×1 IMPLANT
SPIKE FLUID TRANSFER (MISCELLANEOUS) ×2 IMPLANT
SUT MNCRL 4-0 (SUTURE) ×2
SUT MNCRL 4-0 27XMFL (SUTURE) ×2
SUT VICRYL 0 UR6 27IN ABS (SUTURE) ×1 IMPLANT
SUTURE MNCRL 4-0 27XMF (SUTURE) ×2 IMPLANT
SYR 30ML LL (SYRINGE) IMPLANT
SYSTEM WECK SHIELD CLOSURE (TROCAR) IMPLANT
TRAP FLUID SMOKE EVACUATOR (MISCELLANEOUS) ×1 IMPLANT
WATER STERILE IRR 500ML POUR (IV SOLUTION) ×1 IMPLANT

## 2023-07-01 NOTE — Transfer of Care (Signed)
Immediate Anesthesia Transfer of Care Note  Patient: Hannah Shaw  Procedure(s) Performed: XI ROBOTIC ASSISTED LAPAROSCOPIC CHOLECYSTECTOMY (Abdomen) INDOCYANINE GREEN FLUORESCENCE IMAGING (ICG) (Abdomen)  Patient Location: PACU  Anesthesia Type:General  Level of Consciousness: drowsy and patient cooperative  Airway & Oxygen Therapy: Patient Spontanous Breathing and Patient connected to face mask oxygen  Post-op Assessment: Report given to RN and Post -op Vital signs reviewed and stable  Post vital signs: Reviewed and stable  Last Vitals:  Vitals Value Taken Time  BP 106/68 07/01/23 1105  Temp 36.7 C 07/01/23 1105  Pulse 70 07/01/23 1110  Resp 31 07/01/23 1110  SpO2 100 % 07/01/23 1110  Vitals shown include unfiled device data.  Last Pain:  Vitals:   07/01/23 0851  TempSrc: Temporal  PainSc: 7          Complications: No notable events documented.

## 2023-07-01 NOTE — Op Note (Signed)
Preoperative diagnosis:  cholelithiasis  Postoperative diagnosis: same as above  Procedure: Robotic assisted Laparoscopic Cholecystectomy.   Anesthesia: GETA   Surgeon: Sung Amabile  Specimen: Gallbladder  Complications: None  EBL: 15mL  Wound Classification: Clean Contaminated  Indications: see HPI  Findings: Critical view of safety noted Cystic duct and artery identified, ligated and divided, clips remained intact at end of procedure Adequate hemostasis  Description of procedure:  The patient was placed on the operating table in the supine position. SCDs placed, pre-op abx administered.  General anesthesia was induced and OG tube placed by anesthesia. A time-out was completed verifying correct patient, procedure, site, positioning, and implant(s) and/or special equipment prior to beginning this procedure. The abdomen was prepped and draped in the usual sterile fashion.    Veress needle was placed at the Palmer's point and insufflation was started after confirming a positive saline drop test and no immediate increase in abdominal pressure.  After reaching 15 mm, the Veress needle was removed and a 5 mm port was placed via optiview technique. The abdomen was inspected and no abnormalities or injuries were found.  Under direct vision, ports were placed in the following locations: 8mm under umbilicus measured 20mm from gallbladder, One 12 mm patient left of the umbilicus. One 8 mm port placed to the patient right of the umbilical port 8 cm apart.  1 additional 8 mm port placed lateral to the 12mm port.  Once ports were placed, The table was placed in the reverse trendelenburg position with the right side up. The Xi platform was brought into the operative field and docked to the ports successfully.  An endoscope was placed through the umbilical port, fenestrated grasper through the adjacent patient right port, prograsp to the far patient left port, and then a hook cautery in the left  port.  The dome of the gallbladder was grasped with prograsp, passed and retracted over the dome of the liver. Adhesions between the gallbladder and omentum, duodenum and transverse colon were lysed via hook cautery. The infundibulum was grasped with the fenestrated grasper and retracted toward the right lower quadrant. This maneuver exposed Calot's triangle. The peritoneum overlying the gallbladder infundibulum was then dissected  and the cystic duct and cystic artery identified.  Critical view of safety with the liver bed clearly visible behind the duct and artery with no additional structures noted.  The cystic duct and cystic artery clipped and divided close to the gallbladder.     The gallbladder was then dissected from its peritoneal and liver bed attachments by electrocautery. Hemostasis was checked prior to removing the hook cautery and the Endo Catch bag was then placed through the 12 mm port and the gallbladder was removed.  The gallbladder was passed off the table as a specimen. There was no evidence of bleeding from the gallbladder fossa or cystic artery or leakage of the bile from the cystic duct stump. The 12 mm port site closed with PMI using 0 vicryl under direct vision.  Abdomen desufflated and secondary trocars were removed under direct vision. No bleeding was noted. All skin incisions then closed with subcuticular sutures of 4-0 monocryl and dressed with topical skin adhesive. The orogastric tube was removed and patient extubated.  The patient tolerated the procedure well and was taken to the postanesthesia care unit in stable condition.  All sponge and instrument count correct at end of procedure.

## 2023-07-01 NOTE — Anesthesia Preprocedure Evaluation (Signed)
Anesthesia Evaluation  Patient identified by MRN, date of birth, ID band Patient awake    Reviewed: Allergy & Precautions, H&P , NPO status , Patient's Chart, lab work & pertinent test results  History of Anesthesia Complications Negative for: history of anesthetic complications  Airway Mallampati: II  TM Distance: >3 FB Neck ROM: full    Dental  (+) Chipped, Poor Dentition, Missing   Pulmonary neg shortness of breath, asthma , Patient abstained from smoking., former smoker          Cardiovascular Exercise Tolerance: Good (-) angina (-) Past MI and (-) DOE negative cardio ROS      Neuro/Psych  Headaches PSYCHIATRIC DISORDERS Anxiety Depression     negative psych ROS   GI/Hepatic Neg liver ROS,GERD  Medicated and Controlled,,  Endo/Other  Hypothyroidism    Renal/GU Renal disease  negative genitourinary   Musculoskeletal   Abdominal   Peds  Hematology negative hematology ROS (+)   Anesthesia Other Findings Past Medical History: No date: Anxiety No date: Asthma     Comment:  WELL CONTROLLED No date: Chronic kidney disease     Comment:  H/O STONES No date: Endometriosis No date: GERD (gastroesophageal reflux disease) No date: Headache No date: History of kidney stones No date: History of ovarian cyst No date: Hypothyroidism No date: Thyroid disease  Past Surgical History: No date: APPENDECTOMY No date: DIAGNOSTIC LAPAROSCOPY No date: ENDOMETRIAL BIOPSY 03/13/2016: LAPAROSCOPY; N/A     Comment:  Procedure: LAPAROSCOPY DIAGNOSTIC;  Surgeon: Elenora Fender               Ward, MD;  Location: [facility] ORS;  Service: Gynecology;                Laterality: N/A;  BMI    Body Mass Index: 17.92 kg/m      Reproductive/Obstetrics negative OB ROS                             Anesthesia Physical Anesthesia Plan  ASA: 3  Anesthesia Plan: General ETT and General   Post-op Pain Management:     Induction: Intravenous  PONV Risk Score and Plan: Propofol infusion and TIVA  Airway Management Planned: Oral ETT  Additional Equipment:   Intra-op Plan:   Post-operative Plan: Extubation in OR  Informed Consent: I have reviewed the patients History and Physical, chart, labs and discussed the procedure including the risks, benefits and alternatives for the proposed anesthesia with the patient or authorized representative who has indicated his/her understanding and acceptance.     Dental Advisory Given  Plan Discussed with: Anesthesiologist, CRNA and Surgeon  Anesthesia Plan Comments: (Patient consented for risks of anesthesia including but not limited to:  - adverse reactions to medications - damage to eyes, teeth, lips or other oral mucosa - nerve damage due to positioning  - sore throat or hoarseness - Damage to heart, brain, nerves, lungs, other parts of body or loss of life  Patient voiced understanding.)        Anesthesia Quick Evaluation

## 2023-07-01 NOTE — Anesthesia Postprocedure Evaluation (Signed)
Anesthesia Post Note  Patient: NAVAH PELTS  Procedure(s) Performed: XI ROBOTIC ASSISTED LAPAROSCOPIC CHOLECYSTECTOMY (Abdomen) INDOCYANINE GREEN FLUORESCENCE IMAGING (ICG) (Abdomen)  Patient location during evaluation: PACU Anesthesia Type: General Level of consciousness: awake and alert Pain management: pain level controlled Vital Signs Assessment: post-procedure vital signs reviewed and stable Respiratory status: spontaneous breathing, nonlabored ventilation, respiratory function stable and patient connected to nasal cannula oxygen Cardiovascular status: blood pressure returned to baseline and stable Postop Assessment: no apparent nausea or vomiting Anesthetic complications: no  No notable events documented.   Last Vitals:  Vitals:   07/01/23 1225 07/01/23 1230  BP:  112/77  Pulse: 84 82  Resp: 14 16  Temp:  37.2 C  SpO2: 100% 100%    Last Pain:  Vitals:   07/01/23 1230  TempSrc:   PainSc: 4                  Stephanie Coup

## 2023-07-01 NOTE — Anesthesia Procedure Notes (Signed)
Procedure Name: Intubation Date/Time: 07/01/2023 9:42 AM  Performed by: Malva Cogan, CRNAPre-anesthesia Checklist: Patient identified, Patient being monitored, Emergency Drugs available and Suction available Patient Re-evaluated:Patient Re-evaluated prior to induction Oxygen Delivery Method: Circle system utilized Preoxygenation: Pre-oxygenation with 100% oxygen Induction Type: IV induction Ventilation: Mask ventilation without difficulty Laryngoscope Size: 3 and McGraph Grade View: Grade I Tube type: Oral Tube size: 6.5 mm Number of attempts: 1 Airway Equipment and Method: Stylet Placement Confirmation: ETT inserted through vocal cords under direct vision, positive ETCO2 and breath sounds checked- equal and bilateral Secured at: 19 cm Tube secured with: Tape Dental Injury: Teeth and Oropharynx as per pre-operative assessment

## 2023-07-01 NOTE — Interval H&P Note (Signed)
History and Physical Interval Note:  07/01/2023 9:12 AM  Hannah Shaw  has presented today for surgery, with the diagnosis of gallstones K80.20.  The various methods of treatment have been discussed with the patient and family. After consideration of risks, benefits and other options for treatment, the patient has consented to  Procedure(s): XI ROBOTIC ASSISTED LAPAROSCOPIC CHOLECYSTECTOMY (N/A) INDOCYANINE GREEN FLUORESCENCE IMAGING (ICG) (N/A) as a surgical intervention.  The patient's history has been reviewed, patient examined, no change in status, stable for surgery.  I have reviewed the patient's chart and labs.  Questions were answered to the patient's satisfaction.    Pt still has no change in symptoms.  I again emphasized the only thing we see are gallstones, and her symptoms are very atypical for biliary disease.  She still understands no guarantee in pain resolution.  Still wishes to proceed.   Ichelle Harral Tonna Boehringer

## 2023-07-01 NOTE — Discharge Instructions (Addendum)
Laparoscopic Cholecystectomy, Care After This sheet gives you information about how to care for yourself after your procedure. Your doctor may also give you more specific instructions. If you have problems or questions, contact your doctor. Follow these instructions at home: Care for cuts from surgery (incisions)  Follow instructions from your doctor about how to take care of your cuts from surgery. Make sure you: Wash your hands with soap and water before you change your bandage (dressing). If you cannot use soap and water, use hand sanitizer. Change your bandage as told by your doctor. Leave stitches (sutures), skin glue, or skin tape (adhesive) strips in place. They may need to stay in place for 2 weeks or longer. If tape strips get loose and curl up, you may trim the loose edges. Do not remove tape strips completely unless your doctor says it is okay. Do not take baths, swim, or use a hot tub until your doctor says it is okay. OK TO SHOWER 24HRS AFTER YOUR SURGERY.  Check your surgical cut area every day for signs of infection. Check for: More redness, swelling, or pain. More fluid or blood. Warmth. Pus or a bad smell. Activity Do not drive or use heavy machinery while taking prescription pain medicine. Do not play contact sports until your doctor says it is okay. Do not drive for 24 hours if you were given a medicine to help you relax (sedative). Rest as needed. Do not return to work or school until your doctor says it is okay. General instructions  tylenol and advil as needed for discomfort.  Please alternate between the two every four hours as needed for pain.    Use narcotics, if prescribed, only when tylenol and motrin is not enough to control pain.  325-650mg every 8hrs to max of 3000mg/24hrs (including the 325mg in every norco dose) for the tylenol.    Advil up to 800mg per dose every 8hrs as needed for pain.   To prevent or treat constipation while you are taking prescription  pain medicine, your doctor may recommend that you: Drink enough fluid to keep your pee (urine) clear or pale yellow. Take over-the-counter or prescription medicines. Eat foods that are high in fiber, such as fresh fruits and vegetables, whole grains, and beans. Limit foods that are high in fat and processed sugars, such as fried and sweet foods. Contact a doctor if: You develop a rash. You have more redness, swelling, or pain around your surgical cuts. You have more fluid or blood coming from your surgical cuts. Your surgical cuts feel warm to the touch. You have pus or a bad smell coming from your surgical cuts. You have a fever. One or more of your surgical cuts breaks open. You have trouble breathing. You have chest pain. You have pain that is getting worse in your shoulders. You faint or feel dizzy when you stand. You have very bad pain in your belly (abdomen). You are sick to your stomach (nauseous) for more than one day. You have throwing up (vomiting) that lasts for more than one day. You have leg pain. This information is not intended to replace advice given to you by your health care provider. Make sure you discuss any questions you have with your health care provider. Document Released: 07/21/2008 Document Revised: 05/02/2016 Document Reviewed: 03/30/2016 Elsevier Interactive Patient Education  2019 [employer]     AMBULATORY SURGERY  DISCHARGE INSTRUCTIONS  The drugs that you were given will stay in your system until tomorrow   so for the next 24 hours you should not:  Drive an automobile Make any legal decisions Drink any alcoholic beverage  You may resume regular meals tomorrow.  Today it is better to start with liquids and gradually work up to solid foods.  You may eat anything you prefer, but it is better to start with liquids, then soup and crackers, and gradually work up to solid foods.  Please notify your doctor immediately if you have any unusual bleeding,  trouble breathing, redness and pain at the surgery site, drainage, fever, or pain not relieved by medication.  Additional Instructions:  Please contact your physician with any problems or Same Day Surgery at [phone], Monday through Friday 6 am to 4 pm, or [place] [facility] number at [phone]. 

## 2023-07-02 ENCOUNTER — Other Ambulatory Visit: Payer: Self-pay | Admitting: Surgery

## 2023-07-02 DIAGNOSIS — K8020 Calculus of gallbladder without cholecystitis without obstruction: Secondary | ICD-10-CM

## 2023-07-02 MED ORDER — OXYCODONE-ACETAMINOPHEN 5-325 MG PO TABS
1.0000 | ORAL_TABLET | Freq: Four times a day (QID) | ORAL | 0 refills | Status: DC | PRN
Start: 2023-07-02 — End: 2023-07-02

## 2023-07-05 NOTE — Group Note (Deleted)

## 2023-09-10 ENCOUNTER — Encounter: Payer: Self-pay | Admitting: Neurology

## 2023-10-26 ENCOUNTER — Encounter: Payer: Self-pay | Admitting: Neurology

## 2023-10-26 ENCOUNTER — Ambulatory Visit (INDEPENDENT_AMBULATORY_CARE_PROVIDER_SITE_OTHER): Payer: 59 | Admitting: Neurology

## 2023-10-26 VITALS — BP 108/72 | HR 83 | Ht 62.0 in | Wt 105.2 lb

## 2023-10-26 DIAGNOSIS — R569 Unspecified convulsions: Secondary | ICD-10-CM | POA: Diagnosis not present

## 2023-10-26 DIAGNOSIS — F32A Depression, unspecified: Secondary | ICD-10-CM
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
## 2023-10-26 NOTE — Patient Instructions (Addendum)
 Good to meet you.  Schedule MRI brain with and without contrast  2. Schedule 1-hour EEG  3. No need to take the Keppra  4. Referral will be sent to [facility] in [place]  5. Follow-up in 3 months, call for any changes

## 2023-11-08 ENCOUNTER — Ambulatory Visit (INDEPENDENT_AMBULATORY_CARE_PROVIDER_SITE_OTHER): Payer: 59 | Admitting: Neurology

## 2023-11-08 DIAGNOSIS — R569 Unspecified convulsions: Secondary | ICD-10-CM

## 2023-11-08 NOTE — Progress Notes (Signed)
EEG complete - results pending 

## 2023-11-12 ENCOUNTER — Ambulatory Visit
Admission: RE | Admit: 2023-11-12 | Discharge: 2023-11-12 | Disposition: A | Discharge status: Home / Self Care | Payer: 59 | Source: Ambulatory Visit | Attending: Neurology

## 2023-11-12 DIAGNOSIS — R569 Unspecified convulsions: Secondary | ICD-10-CM

## 2023-11-12 MED ORDER — GADOPICLENOL 0.5 MMOL/ML IV SOLN
7.5000 mL | Freq: Once | INTRAVENOUS | Status: AC | PRN
  Administered 2023-11-12: 5 mL via INTRAVENOUS

## 2023-11-17 NOTE — Addendum Note (Signed)
Addended by: Van Clines on: 11/17/2023 03:25 PM   Modules accepted: Orders

## 2023-11-17 NOTE — Procedures (Signed)
ELECTROENCEPHALOGRAM REPORT  Date of Study: 11/08/2023  Patient's Name: Hannah Shaw MRN: [id number] Date of Birth: 01/21/1996  Referring Provider: Dr. Patrcia Dolly  Clinical History: This is a 28 year old woman with recurrent shaking episodes with unresponsiveness. EEG for classification.  CNS Active Medications: Gabapentin  Technical Summary: A multichannel digital 1-hour EEG recording measured by the international 10-20 system with electrodes applied with paste and impedances below 5000 ohms performed in our laboratory with EKG monitoring in an awake patient.  Hyperventilation and photic stimulation were performed.  The digital EEG was referentially recorded, reformatted, and digitally filtered in a variety of bipolar and referential montages for optimal display.    Description: The patient is awake during the recording.  During maximal wakefulness, there is a symmetric, medium voltage 11 Hz posterior dominant rhythm that attenuates with eye opening.  The record is symmetric.  Sleep was not captured. Hyperventilation and photic stimulation did not elicit any abnormalities. There were 2 episodes of low amplitude irregular upper body shaking with eyes closed that increased in intensity, lasting 2 minutes and 6 minutes, unresponsive to EEG tech. When eyelids were pulled up both times, patient started responding. There were no EEG or EKG changes during these episodes. There were no epileptiform discharges or electrographic seizures seen.    EKG lead was unremarkable.  Impression: This 1-hour awake EEG is normal.  Two episodes of upper body shaking with unresponsiveness captured did not show any EEG change.  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 If further clinical questions remain, prolonged EEG may be helpful.  Clinical correlation is advised.   Patrcia Dolly, M.D.

## 2023-11-30 ENCOUNTER — Telehealth: Payer: Self-pay | Admitting: Neurology

## 2023-11-30 NOTE — Telephone Encounter (Signed)
 Spoke to patient. She states she is still having the shaking episodes.  Discussed normal brain MRI and EEG. Discussed the shaking and unresponsive episodes captured on the EEG were non-epileptic, consistent with what we had discussed in the office. There is no indication for seizure medication and treatment is cognitive behavioral therapy. She will be seeing Behavioral Health tomorrow and will discuss this with them. F/u as scheduled in April.

## 2024-01-17 ENCOUNTER — Other Ambulatory Visit: Payer: Self-pay

## 2024-01-17 ENCOUNTER — Emergency Department

## 2024-01-17 ENCOUNTER — Emergency Department
Admission: EM | Admit: 2024-01-17 | Discharge: 2024-01-18 | Disposition: A | Discharge status: Home / Self Care | Attending: Emergency Medicine | Admitting: Emergency Medicine

## 2024-01-17 ENCOUNTER — Encounter: Payer: Self-pay | Admitting: *Deleted

## 2024-01-17 DIAGNOSIS — G8929 Other chronic pain: Secondary | ICD-10-CM | POA: Insufficient documentation

## 2024-01-17 DIAGNOSIS — N83209 Unspecified ovarian cyst, unspecified side: Secondary | ICD-10-CM

## 2024-01-17 DIAGNOSIS — R42 Dizziness and giddiness: Secondary | ICD-10-CM | POA: Diagnosis not present

## 2024-01-17 DIAGNOSIS — R9431 Abnormal electrocardiogram [ECG] [EKG]: Secondary | ICD-10-CM | POA: Diagnosis not present

## 2024-01-17 DIAGNOSIS — N83202 Unspecified ovarian cyst, left side: Secondary | ICD-10-CM | POA: Insufficient documentation

## 2024-01-17 DIAGNOSIS — K760 Fatty (change of) liver, not elsewhere classified: Secondary | ICD-10-CM | POA: Diagnosis not present

## 2024-01-17 DIAGNOSIS — R1031 Right lower quadrant pain: Secondary | ICD-10-CM | POA: Insufficient documentation

## 2024-01-17 DIAGNOSIS — R55 Syncope and collapse: Secondary | ICD-10-CM | POA: Diagnosis present

## 2024-01-17 DIAGNOSIS — R0602 Shortness of breath: Secondary | ICD-10-CM | POA: Insufficient documentation

## 2024-01-17 LAB — URINE DRUG SCREEN, QUALITATIVE (ARMC ONLY)
Amphetamines, Ur Screen: NOT DETECTED
Barbiturates, Ur Screen: NOT DETECTED
Benzodiazepine, Ur Scrn: NOT DETECTED
Cannabinoid 50 Ng, Ur ~~LOC~~: NOT DETECTED
Cocaine Metabolite,Ur ~~LOC~~: NOT DETECTED
MDMA (Ecstasy)Ur Screen: NOT DETECTED
Methadone Scn, Ur: NOT DETECTED
Opiate, Ur Screen: NOT DETECTED
Phencyclidine (PCP) Ur S: NOT DETECTED
Tricyclic, Ur Screen: NOT DETECTED

## 2024-01-17 LAB — CBC
HCT: 45.2 % (ref 36.0–46.0)
Hemoglobin: 16.2 g/dL — ABNORMAL HIGH (ref 12.0–15.0)
MCH: 33.5 pg (ref 26.0–34.0)
MCHC: 35.8 g/dL (ref 30.0–36.0)
MCV: 93.6 fL (ref 80.0–100.0)
Platelets: 251 10*3/uL (ref 150–400)
RBC: 4.83 MIL/uL (ref 3.87–5.11)
RDW: 10.7 % — ABNORMAL LOW (ref 11.5–15.5)
WBC: 7.9 10*3/uL (ref 4.0–10.5)
nRBC: 0.3 % — ABNORMAL HIGH (ref 0.0–0.2)

## 2024-01-17 LAB — BASIC METABOLIC PANEL
Anion gap: 10 (ref 5–15)
BUN: 5 mg/dL — ABNORMAL LOW (ref 6–20)
CO2: 26 mmol/L (ref 22–32)
Calcium: 9.6 mg/dL (ref 8.9–10.3)
Chloride: 101 mmol/L (ref 98–111)
Creatinine, Ser: 0.64 mg/dL (ref 0.44–1.00)
GFR, Estimated: >60 mL/min (ref >60)
Glucose, Bld: 106 mg/dL — ABNORMAL HIGH (ref 70–99)
Potassium: 3.5 mmol/L (ref 3.5–5.1)
Sodium: 137 mmol/L (ref 135–145)

## 2024-01-17 LAB — TROPONIN I (HIGH SENSITIVITY): Troponin I (High Sensitivity): 4 ng/L (ref <18)

## 2024-01-17 LAB — POC URINE PREG, ED: Preg Test, Ur: NEGATIVE

## 2024-01-17 MED ORDER — SODIUM CHLORIDE 0.9 % IV BOLUS
1000.0000 mL | Freq: Once | INTRAVENOUS | Status: AC
  Administered 2024-01-17: 1000 mL via INTRAVENOUS

## 2024-01-17 MED ORDER — HYDROMORPHONE HCL 1 MG/ML IJ SOLN
0.5000 mg | Freq: Once | INTRAMUSCULAR | Status: AC
  Administered 2024-01-17: 0.5 mg via INTRAVENOUS
  Filled 2024-01-17: qty 0.5

## 2024-01-17 MED ORDER — ONDANSETRON HCL 4 MG/2ML IJ SOLN
4.0000 mg | Freq: Once | INTRAMUSCULAR | Status: AC
  Administered 2024-01-17: 4 mg via INTRAVENOUS
  Filled 2024-01-17: qty 2

## 2024-01-17 NOTE — ED Provider Notes (Signed)
 [facility] Provider Note    Event Date/Time   First MD Initiated Contact with Patient 01/17/24 2300     (approximate)   History   Loss of Consciousness   HPI  Hannah Shaw is a 28 y.o. female with history of endometriosis, ovarian cyst, and cholelithiasis who presents with syncope, acute onset this evening.  The patient states that she was just sitting.  She has had increased right lower quadrant pain that she relates to a known ovarian cyst over the last several days.  It acutely worsened this evening.  The patient started to feel lightheaded and got sweaty and nauseated.  She then passed out.  Per her father, she was unconscious for approximately 10 minutes.  He felt that her breathing was not adequate and felt only a thready pulse so he started CPR on and off during the 10-minutes.  The patient then came to.  She had some shortness of breath and chest pain prior to passing out, but the symptoms have resolved.  She states that the abdominal pain is in the same location that has been related to her ovarian cyst but the intensity is worse.  She has had no vomiting or diarrhea.  I reviewed the past medical records.  The patient's most recent outpatient encounter was with general surgery in August of last year for evaluation of chronic right upper quadrant pain.  At that time it was documented that the patient has episodes of seizure-like activity triggered by pain.   Physical Exam   Triage Vital Signs: ED Triage Vitals  Encounter Vitals Group     BP 01/17/24 2208 117/74     Systolic BP Percentile --      Diastolic BP Percentile --      Pulse Rate 01/17/24 2208 94     Resp 01/17/24 2208 18     Temp 01/17/24 2208 97.9 F (36.6 C)     Temp Source 01/17/24 2208 Oral     SpO2 01/17/24 2208 97 %     Weight 01/17/24 2206 115 lb (52.2 kg)     Height 01/17/24 2206 5\' 2"  (1.575 m)     Head Circumference --      Peak Flow --      Pain Score 01/17/24 2206 9      Pain Loc --      Pain Education --      Exclude from Growth Chart --     Most recent vital signs: Vitals:   01/18/24 0207 01/18/24 0327  BP:  101/70  Pulse:  72  Resp:  16  Temp: 98 F (36.7 C) 98 F (36.7 C)  SpO2:  99%     General: Alert, no distress.  CV:  Good peripheral perfusion.  Resp:  Normal effort.  Lungs CTAB. Abd:  Soft with moderate right lower quadrant tenderness.  No peritoneal signs.  No distention.  Other:  EOMI.  PERRLA.  No photophobia.  Normal speech.  Motor intact in all extremities.  No ataxia.   ED Results / Procedures / Treatments   Labs (all labs ordered are listed, but only abnormal results are displayed) Labs Reviewed  BASIC METABOLIC PANEL - Abnormal; Notable for the following components:      Result Value   Glucose, Bld 106 (*)    BUN 5 (*)    All other components within normal limits  CBC - Abnormal; Notable for the following components:   Hemoglobin 16.2 (*)  RDW 10.7 (*)    nRBC 0.3 (*)    All other components within normal limits  URINE DRUG SCREEN, QUALITATIVE ([facility] ONLY)  HEPATIC FUNCTION PANEL  LIPASE, BLOOD  POC URINE PREG, ED  TROPONIN I (HIGH SENSITIVITY)  TROPONIN I (HIGH SENSITIVITY)     EKG  ED ECG REPORT I, Dionne Bucy, the attending physician, personally viewed and interpreted this ECG.  Date: 01/17/2024 EKG Time: 2206 Rate: 91 Rhythm: normal sinus rhythm QRS Axis: Borderline right axis Intervals: Borderline prolonged QTc ST/T Wave abnormalities: Repolarization abnormality Narrative Interpretation: no evidence of acute ischemia    RADIOLOGY  Chest x-ray: I independently viewed and interpreted the images; there is no focal consolidation or edema  CT abdomen/pelvis:   IMPRESSION:  No acute abnormality to correspond with the given clinical history.    Fatty liver.   US pelvis:  IMPRESSION:  Likely hemorrhagic cyst in the left ovary. No further follow-up is  recommended.    No other  focal abnormality is noted.     PROCEDURES:  Critical Care performed: No  Procedures   MEDICATIONS ORDERED IN ED: Medications  sodium chloride 0.9 % bolus 1,000 mL (0 mLs Intravenous Stopped 01/18/24 0206)  HYDROmorphone (DILAUDID) injection 0.5 mg (0.5 mg Intravenous Given 01/17/24 2339)  ondansetron (ZOFRAN) injection 4 mg (4 mg Intravenous Given 01/17/24 2339)  oxyCODONE-acetaminophen (PERCOCET/ROXICET) 5-325 MG per tablet 1 tablet (1 tablet Oral Given 01/18/24 0323)     IMPRESSION / MDM / ASSESSMENT AND PLAN / ED COURSE  I reviewed the triage vital signs and the nursing notes.  28 year old female with PMH as noted above presents with acute onset of syncope with a prodrome in the context of acutely worsened right lower quadrant abdominal pain.  Currently her vital signs are normal.  Neurologic exam is nonfocal.  She has right lower quadrant tenderness on abdominal exam.  Differential diagnosis includes, but is not limited to:  Syncope: This is most consistent with vasovagal episode given the prodromal symptoms and the relationship to acute pain.  It does not appear that the patient actually lost pulses, so CPR likely was not indicated.  Currently her vital signs are normal.  Will obtain basic labs, cardiac enzymes, give fluids, and observe the patient on the cardiac monitor.  Abdominal pain: Chronic pain due to ovarian cyst, ruptured ovarian cyst, appendicitis, colitis, diverticulitis, less likely ovarian torsion.  We will obtain a CT for further evaluation.  If this is nondiagnostic patient may need an ultrasound as well.  She has not had imaging since mid 2024.  Patient's presentation is most consistent with acute presentation with potential threat to life or bodily function.  The patient is on the cardiac monitor to evaluate for evidence of arrhythmia and/or significant heart rate changes.  ----------------------------------------- 3:08 AM on  01/18/2024 -----------------------------------------  Lab workup is unremarkable.  CBC shows no leukocytosis.  BMP shows normal electrolytes.  LFTs and lipase are normal.  Troponin is negative.  CT showed no acute abnormality so I obtain an ultrasound for further evaluation.  This shows a likely ruptured left ovarian cyst which is consistent with the patient's acutely worsened pain.  There is no evidence of torsion or other acute complication.  On reassessment, the patient's pain is improved.  I did consider whether the patient may benefit from inpatient mission given the syncopal event.  However, she has remained hemodynamically stable with no recurrent lightheadedness, near-syncope, or syncope.  Her workup is reassuring.  She feels well and would  like to go home.  She is appropriate for discharge at this time.  I counseled her on the results of the workup and on return precautions, and she expressed understanding.  She will follow-up with her regular doctors.    FINAL CLINICAL IMPRESSION(S) / ED DIAGNOSES   Final diagnoses:  Syncope, unspecified syncope type  Ruptured ovarian cyst     Rx / DC Orders   ED Discharge Orders     None        Note:  This document was prepared using Dragon voice recognition software and may include unintentional dictation errors.    Dionne Bucy, MD 01/18/24 [id number]
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
## 2024-01-17 NOTE — ED Notes (Signed)
 Poct pregnancy Negative
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
## 2024-01-18 ENCOUNTER — Emergency Department

## 2024-01-18 DIAGNOSIS — R55 Syncope and collapse: Secondary | ICD-10-CM | POA: Diagnosis not present

## 2024-01-18 LAB — HEPATIC FUNCTION PANEL
ALT: 27 U/L (ref 0–44)
AST: 27 U/L (ref 15–41)
Albumin: 4.5 g/dL (ref 3.5–5.0)
Alkaline Phosphatase: 48 U/L (ref 38–126)
Bilirubin, Direct: 0.2 mg/dL (ref 0.0–0.2)
Indirect Bilirubin: 0.5 mg/dL (ref 0.3–0.9)
Total Bilirubin: 0.7 mg/dL (ref 0.0–1.2)
Total Protein: 7.6 g/dL (ref 6.5–8.1)

## 2024-01-18 LAB — LIPASE, BLOOD: Lipase: 21 U/L (ref 11–51)

## 2024-01-18 LAB — TROPONIN I (HIGH SENSITIVITY): Troponin I (High Sensitivity): 3 ng/L (ref <18)

## 2024-01-18 MED ORDER — OXYCODONE-ACETAMINOPHEN 5-325 MG PO TABS
1.0000 | ORAL_TABLET | Freq: Once | ORAL | Status: AC
  Administered 2024-01-18: 1 via ORAL
  Filled 2024-01-18: qty 1

## 2024-01-18 NOTE — Discharge Instructions (Addendum)
 You were prescribed a 30-day supply of oxycodone on 3/16, so you should take this as needed for pain.  Follow-up with your primary care provider and your OB/GYN.  Return to the ER for new, worsening, or persistent severe abdominal pain, dizziness, feeling like you are going to pass out or actually passing out, or any other new or worsening symptoms that concern you.

## 2024-01-28 ENCOUNTER — Ambulatory Visit (INDEPENDENT_AMBULATORY_CARE_PROVIDER_SITE_OTHER): Payer: 59 | Admitting: Neurology

## 2024-01-28 ENCOUNTER — Encounter: Payer: Self-pay | Admitting: Neurology

## 2024-01-28 VITALS — BP 112/72 | HR 93 | Ht 62.0 in | Wt 101.4 lb

## 2024-01-28 DIAGNOSIS — F445 Conversion disorder with seizures or convulsions: Secondary | ICD-10-CM

## 2024-01-28 NOTE — Patient Instructions (Signed)
 Good to see you doing well! Continue follow-up with Psychiatry and stress management. Follow-up as needed, call for any changes.
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
## 2024-03-30 ENCOUNTER — Ambulatory Visit: Admitting: Obstetrics and Gynecology

## 2024-06-05 ENCOUNTER — Emergency Department
Admission: EM | Admit: 2024-06-05 | Discharge: 2024-06-05 | Disposition: A | Discharge status: Home / Self Care | Attending: Emergency Medicine | Admitting: Emergency Medicine

## 2024-06-05 ENCOUNTER — Other Ambulatory Visit: Payer: Self-pay

## 2024-06-05 ENCOUNTER — Encounter: Payer: Self-pay | Admitting: Emergency Medicine

## 2024-06-05 ENCOUNTER — Emergency Department

## 2024-06-05 DIAGNOSIS — N83202 Unspecified ovarian cyst, left side: Secondary | ICD-10-CM | POA: Insufficient documentation

## 2024-06-05 DIAGNOSIS — N189 Chronic kidney disease, unspecified: Secondary | ICD-10-CM | POA: Insufficient documentation

## 2024-06-05 DIAGNOSIS — E876 Hypokalemia: Secondary | ICD-10-CM | POA: Diagnosis not present

## 2024-06-05 DIAGNOSIS — R102 Pelvic and perineal pain: Secondary | ICD-10-CM | POA: Diagnosis present

## 2024-06-05 LAB — URINALYSIS, ROUTINE W REFLEX MICROSCOPIC
Bilirubin Urine: NEGATIVE
Glucose, UA: NEGATIVE mg/dL
Ketones, ur: NEGATIVE mg/dL
Leukocytes,Ua: NEGATIVE
Nitrite: NEGATIVE
Protein, ur: NEGATIVE mg/dL
Specific Gravity, Urine: 1.003 — ABNORMAL LOW (ref 1.005–1.030)
pH: 5 (ref 5.0–8.0)

## 2024-06-05 LAB — COMPREHENSIVE METABOLIC PANEL WITH GFR
ALT: 16 U/L (ref 0–44)
AST: 24 U/L (ref 15–41)
Albumin: 4.4 g/dL (ref 3.5–5.0)
Alkaline Phosphatase: 45 U/L (ref 38–126)
Anion gap: 11 (ref 5–15)
BUN: <5 mg/dL — ABNORMAL LOW (ref 6–20)
CO2: 23 mmol/L (ref 22–32)
Calcium: 9 mg/dL (ref 8.9–10.3)
Chloride: 103 mmol/L (ref 98–111)
Creatinine, Ser: 0.69 mg/dL (ref 0.44–1.00)
GFR, Estimated: >60 mL/min
Glucose, Bld: 96 mg/dL (ref 70–99)
Potassium: 2.9 mmol/L — ABNORMAL LOW (ref 3.5–5.1)
Sodium: 137 mmol/L (ref 135–145)
Total Bilirubin: 0.7 mg/dL (ref 0.0–1.2)
Total Protein: 7.7 g/dL (ref 6.5–8.1)

## 2024-06-05 LAB — CBC
HCT: 41.6 % (ref 36.0–46.0)
Hemoglobin: 15 g/dL (ref 12.0–15.0)
MCH: 33.6 pg (ref 26.0–34.0)
MCHC: 36.1 g/dL — ABNORMAL HIGH (ref 30.0–36.0)
MCV: 93.3 fL (ref 80.0–100.0)
Platelets: 249 K/uL (ref 150–400)
RBC: 4.46 MIL/uL (ref 3.87–5.11)
RDW: 10.9 % — ABNORMAL LOW (ref 11.5–15.5)
WBC: 9.7 K/uL (ref 4.0–10.5)
nRBC: 0 % (ref 0.0–0.2)

## 2024-06-05 LAB — POC URINE PREG, ED: Preg Test, Ur: NEGATIVE

## 2024-06-05 LAB — LIPASE, BLOOD: Lipase: 25 U/L (ref 11–51)

## 2024-06-05 MED ORDER — ACETAMINOPHEN 500 MG PO TABS
1000.0000 mg | ORAL_TABLET | Freq: Once | ORAL | Status: AC
  Administered 2024-06-05 (×2): 1000 mg via ORAL
  Filled 2024-06-05: qty 2

## 2024-06-05 MED ORDER — POTASSIUM CHLORIDE CRYS ER 20 MEQ PO TBCR
40.0000 meq | EXTENDED_RELEASE_TABLET | Freq: Once | ORAL | Status: AC
Start: 2024-06-05 — End: 2024-06-05
  Administered 2024-06-05 (×2): 40 meq via ORAL
  Filled 2024-06-05: qty 2

## 2024-06-05 MED ORDER — KETOROLAC TROMETHAMINE 30 MG/ML IJ SOLN
30.0000 mg | Freq: Once | INTRAMUSCULAR | Status: AC
  Administered 2024-06-05 (×2): 30 mg via INTRAMUSCULAR
  Filled 2024-06-05: qty 1

## 2024-06-05 MED ORDER — OXYCODONE HCL 5 MG PO TABS
10.0000 mg | ORAL_TABLET | Freq: Once | ORAL | Status: AC
  Administered 2024-06-05 (×2): 10 mg via ORAL
  Filled 2024-06-05: qty 2

## 2024-06-05 NOTE — ED Notes (Signed)
 Patient brought to room and placed on cardiac monitor. Warm blanket provided. Call bell within reach.

## 2024-06-05 NOTE — ED Triage Notes (Signed)
 Pt BIB EMS from home with c/o abdominal pain x 2 days with vomiting.

## 2024-06-05 NOTE — ED Provider Notes (Signed)
 [facility] Provider Note    Event Date/Time   First MD Initiated Contact with Patient 06/05/24 [phone]     (approximate)   History   Abdominal Pain   HPI  Hannah Shaw is a 28 y.o. female   Past medical history of chronic abdominal pain, CKD, endometriosis, ovarian cysts, here with lower abdominal/pelvic pain over the last couple of days consistent with her pain associated with her ovarian cyst in the past.  She denies any vaginal discharge, urinary symptoms, and has had some nausea associated.  There is a crampy quality of the pain.  External Medical Documents Reviewed: Surgery notes from last August documenting chronic abdominal pain, history of endometriosis status post resection      Physical Exam   Triage Vital Signs: ED Triage Vitals  Encounter Vitals Group     BP 06/05/24 0148 115/82     Girls Systolic BP Percentile --      Girls Diastolic BP Percentile --      Boys Systolic BP Percentile --      Boys Diastolic BP Percentile --      Pulse Rate 06/05/24 0148 (!) 108     Resp 06/05/24 0148 18     Temp 06/05/24 0148 98.6 F (37 C)     Temp src --      SpO2 06/05/24 0148 100 %     Weight 06/05/24 0147 101 lb 6.6 oz (46 kg)     Height 06/05/24 0147 5' 2 (1.575 m)     Head Circumference --      Peak Flow --      Pain Score 06/05/24 0146 10     Pain Loc --      Pain Education --      Exclude from Growth Chart --     Most recent vital signs: Vitals:   06/05/24 0250 06/05/24 0300  BP: 101/66 106/79  Pulse: 83 89  Resp: 16 (!) 28  Temp:    SpO2: 97% 100%    General: Awake, no distress.  CV:  Good peripheral perfusion.  Resp:  Normal effort.  Abd:  No distention.  Other:  Awake alert comfortable appearing with normal vital signs and afebrile.  Nonfocal nontender nonperitoneal abdominal exam.   ED Results / Procedures / Treatments   Labs (all labs ordered are listed, but only abnormal results are displayed) Labs Reviewed   COMPREHENSIVE METABOLIC PANEL WITH GFR - Abnormal; Notable for the following components:      Result Value   Potassium 2.9 (*)    BUN <5 (*)    All other components within normal limits  CBC - Abnormal; Notable for the following components:   MCHC 36.1 (*)    RDW 10.9 (*)    All other components within normal limits  URINALYSIS, ROUTINE W REFLEX MICROSCOPIC - Abnormal; Notable for the following components:   Color, Urine STRAW (*)    APPearance CLEAR (*)    Specific Gravity, Urine 1.003 (*)    Hgb urine dipstick SMALL (*)    Bacteria, UA RARE (*)    All other components within normal limits  LIPASE, BLOOD  POC URINE PREG, ED     I ordered and reviewed the above labs they are notable for mild hypokalemia.  White count normal.  Not pregnant  RADIOLOGY I independently reviewed and interpreted pelvic ultrasound and see normal-appearing uterus without obvious mass I also reviewed radiologist's formal read.   PROCEDURES:  Critical  Care performed: No  Procedures   MEDICATIONS ORDERED IN ED: Medications  potassium chloride  SA (KLOR-CON  M) CR tablet 40 mEq (has no administration in time range)  acetaminophen  (TYLENOL ) tablet 1,000 mg (1,000 mg Oral Given 06/05/24 0352)  ketorolac  (TORADOL ) 30 MG/ML injection 30 mg (30 mg Intramuscular Given 06/05/24 0355)  oxyCODONE  (Oxy IR/ROXICODONE ) immediate release tablet 10 mg (10 mg Oral Given 06/05/24 0402)     IMPRESSION / MDM / ASSESSMENT AND PLAN / ED COURSE  I reviewed the triage vital signs and the nursing notes.                                Patient's presentation is most consistent with acute presentation with potential threat to life or bodily function.  Differential diagnosis includes, but is not limited to, ovarian cyst, ruptured cyst, ovarian torsion, ectopic pregnancy or pregnancy related complications, considered but less likely appendicitis or diverticulitis perforated viscus or other acute abdominal pathology   The  patient is on the cardiac monitor to evaluate for evidence of arrhythmia and/or significant heart rate changes.  MDM:    Patient here with crampy lower abdominal/pelvic pain consistent with her pain associated with her ovarian cyst in the past.  With no leukocytosis, crampy intermittent nature of pain, and a very benign abdominal exam without acute abdominal pathologies like appendicitis or other surgical pathologies.  Will obtain a transvaginal ultrasound to rule out torsion, which fortunately looks negative but does show an ovarian cyst that is consistent with her symptoms.  She has no vaginal discharge and states low risk for STDs and I offered this testing but she declines at this time.  She is stable for discharge and will follow-up with her PMD.       FINAL CLINICAL IMPRESSION(S) / ED DIAGNOSES   Final diagnoses:  Cyst of left ovary  Hypokalemia     Rx / DC Orders   ED Discharge Orders     None        Note:  This document was prepared using Dragon voice recognition software and may include unintentional dictation errors.    Cyrena Mylar, MD 06/05/24 [phone]

## 2024-06-05 NOTE — ED Notes (Signed)
 Patient noted to be walking in and out of waiting room. Patient instructed to stay inside building.

## 2024-06-05 NOTE — ED Triage Notes (Signed)
 FIRST NURSE NOTE:  To ER via EMS for complaint of abdominal pain around umbilicus. Denies pregnancy for EMS, unknown next menstrual period. Appears to be in distress. 10/10 per EMS and history of ovarian cyst which she reports is the same pain.

## 2024-06-05 NOTE — Discharge Instructions (Addendum)
 You have an ovarian cyst that accounts for your pain.  Fortunately there is no twisting or  ovarian torsion that was found on your ultrasound.  Take acetaminophen  650 mg and ibuprofen  400 mg every 6 hours for pain.  Take with food.  Please follow-up with your primary doctor or gynecologist.  If you do not have a gynecologist I have listed the [facility] gynecology clinic for you to call.  Your potassium was slightly low.  We gave you some extra potassium in the emergency department.  See the attached documents for ways to boost your potassium in your diet and have your doctor recheck this level in the next 1 to 2 weeks.  Thank you for choosing us  for your health care today!  Please see your primary doctor this week for a follow up appointment.   If you have any new, worsening, or unexpected symptoms call your doctor right away or come back to the emergency department for reevaluation.  It was my pleasure to care for you today.   Ginnie EDISON Cyrena, MD

## 2024-06-07 ENCOUNTER — Telehealth: Payer: Self-pay | Admitting: Obstetrics & Gynecology

## 2024-06-07 ENCOUNTER — Telehealth: Payer: Self-pay | Admitting: Adult Health

## 2024-06-07 NOTE — Telephone Encounter (Signed)
 Patient went to [facility] and was seen in ED. Can you look at notes and tell me what she needs? Please advise.

## 2024-06-07 NOTE — Telephone Encounter (Signed)
 New patient

## 2024-06-14 ENCOUNTER — Encounter: Payer: Self-pay | Admitting: Obstetrics & Gynecology

## 2024-06-14 ENCOUNTER — Ambulatory Visit: Admitting: Obstetrics & Gynecology

## 2024-06-14 ENCOUNTER — Other Ambulatory Visit (HOSPITAL_COMMUNITY)
Admission: RE | Admit: 2024-06-14 | Discharge: 2024-06-14 | Disposition: A | Discharge status: Home / Self Care | Source: Ambulatory Visit | Attending: Obstetrics & Gynecology | Admitting: Obstetrics & Gynecology

## 2024-06-14 VITALS — BP 100/66 | HR 89 | Ht 62.0 in | Wt 97.8 lb

## 2024-06-14 DIAGNOSIS — N83209 Unspecified ovarian cyst, unspecified side: Secondary | ICD-10-CM

## 2024-06-14 DIAGNOSIS — R1084 Generalized abdominal pain: Secondary | ICD-10-CM

## 2024-06-14 DIAGNOSIS — Z124 Encounter for screening for malignant neoplasm of cervix: Secondary | ICD-10-CM

## 2024-06-14 DIAGNOSIS — Z1151 Encounter for screening for human papillomavirus (HPV): Secondary | ICD-10-CM | POA: Diagnosis not present

## 2024-06-14 DIAGNOSIS — Z01419 Encounter for gynecological examination (general) (routine) without abnormal findings: Secondary | ICD-10-CM | POA: Diagnosis present

## 2024-06-14 NOTE — Progress Notes (Signed)
 GYN VISIT Patient name: Hannah Shaw MRN [id number]  Date of birth: 10-Jan-1996 Chief Complaint:   Follow-up (Ed cyst )  History of Present Illness:   Hannah Shaw is a 28 y.o. G44P0010 female being seen today for ED follow up:  She notes abdominal pain for the past week or so.  She reports the pain is being in her stomach and present all the time.  She notes some nausea and decreased appetite.  Denies vomiting.  Denies vaginal bleeding, discharge, itching or irritation.  She reports that she is not sexually active.  States she has been taking oxycodone  for her pain which seems to help.  She denies any constipation or diarrhea.  Reports no other GI symptoms  Periods are every month, no HMB.  Some dysmenorrhea.  Patient's last menstrual period was 05/24/2024.    Review of Systems:   Pertinent items are noted in HPI Denies fever/chills, dizziness, headaches, visual disturbances, fatigue, shortness of breath, chest pain Pertinent History Reviewed:   Past Surgical History:  Procedure Laterality Date   APPENDECTOMY     COLONOSCOPY WITH PROPOFOL  N/A 09/08/2019   Procedure: COLONOSCOPY WITH PROPOFOL ;  Surgeon: Dessa Reyes ORN, MD;  Location: [facility] ENDOSCOPY;  Service: Endoscopy;  Laterality: N/A;   DIAGNOSTIC LAPAROSCOPY     ENDOMETRIAL BIOPSY     ESOPHAGOGASTRODUODENOSCOPY (EGD) WITH PROPOFOL  N/A 09/08/2019   Procedure: ESOPHAGOGASTRODUODENOSCOPY (EGD) WITH PROPOFOL ;  Surgeon: Dessa Reyes ORN, MD;  Location: [facility] ENDOSCOPY;  Service: Endoscopy;  Laterality: N/A;   GALLBLADDER SURGERY  2024   LAPAROSCOPY N/A 03/13/2016   Procedure: LAPAROSCOPY DIAGNOSTIC;  Surgeon: Mitzie BROCKS Ward, MD;  Location: [facility] ORS;  Service: Gynecology;  Laterality: N/A;    Past Medical History:  Diagnosis Date   Anxiety    Asthma    WELL CONTROLLED   Chronic bilateral low back pain with bilateral sciatica    Chronic kidney disease    H/O STONES   Constipation    Depression    Endometriosis     GERD (gastroesophageal reflux disease)    Headache    History of kidney stones    History of ovarian cyst    Hypothyroidism    Miscarriage    Thyroid  disease    Verbal apraxia    Verbal apraxia    Reviewed problem list, medications and allergies. Physical Assessment:   Vitals:   06/14/24 1033  BP: 100/66  Pulse: 89  Weight: 97 lb 12.8 oz (44.4 kg)  Height: 5' 2 (1.575 m)  Body mass index is 17.89 kg/m.       Physical Examination:   General appearance: alert, well appearing, and in no distress  Psych: mood appropriate, normal affect  Skin: warm & dry   Cardiovascular: normal heart rate noted  Respiratory: normal respiratory effort, no distress  Abdomen: Soft, no masses noted, voluntary guarding appreciated.  Pelvic: VULVA: normal appearing vulva with no masses, tenderness or lesions, VAGINA: normal appearing vagina with normal color and discharge, no lesions, CERVIX: normal appearing cervix without discharge or lesions, UTERUS: uterus is normal size, shape, consistency and nontender, ADNEXA: normal adnexa in size, nontender and no masses  Extremities: no edema   Chaperone: Aleck Blase    Assessment & Plan:  1) Abdominal pain - Etiology of pain remains unclear - Reviewed pelvic ultrasound, though patient does have an ovarian cyst it is physiologic in nature.  Based on physical exam, history and ultrasound results do not think the ovarian cyst is the reason for  her pain - Discussed considering Tylenol  or heating pack to help improve her symptoms  2) Preventive screening - Pap completed today, reviewed ASCCP guidelines   No orders of the defined types were placed in this encounter.   Return in about 1 year (around 06/14/2025) for Annual.   Adithi Gammon, DO Attending Obstetrician & Gynecologist, [facility] for [facility], [facility]

## 2024-06-20 LAB — CYTOLOGY - PAP
Comment: NEGATIVE
Comment: NEGATIVE
Comment: NEGATIVE
HPV 16: NEGATIVE
HPV 18 / 45: NEGATIVE
High risk HPV: POSITIVE — AB

## 2024-06-22 ENCOUNTER — Ambulatory Visit: Payer: Self-pay | Admitting: Obstetrics & Gynecology

## 2024-07-05 ENCOUNTER — Telehealth: Payer: Self-pay

## 2024-07-05 NOTE — Telephone Encounter (Signed)
 Dr Ozan tried calling patient about colposcopy results. RN discussed results as well as next steps with patient. Patients questions were answered and sent to the front to schedule colposcopy as soon as possible.

## 2024-07-11 ENCOUNTER — Other Ambulatory Visit (HOSPITAL_COMMUNITY)
Admission: RE | Admit: 2024-07-11 | Discharge: 2024-07-11 | Disposition: A | Discharge status: Home / Self Care | Source: Ambulatory Visit | Attending: Women's Health | Admitting: Women's Health

## 2024-07-11 ENCOUNTER — Ambulatory Visit: Admitting: Women's Health

## 2024-07-11 ENCOUNTER — Encounter: Payer: Self-pay | Admitting: Women's Health

## 2024-07-11 VITALS — BP 127/80 | HR 102 | Ht 62.0 in | Wt 100.5 lb

## 2024-07-11 DIAGNOSIS — Z3202 Encounter for pregnancy test, result negative: Secondary | ICD-10-CM | POA: Diagnosis not present

## 2024-07-11 DIAGNOSIS — R87612 Low grade squamous intraepithelial lesion on cytologic smear of cervix (LGSIL): Secondary | ICD-10-CM

## 2024-07-11 DIAGNOSIS — R87619 Unspecified abnormal cytological findings in specimens from cervix uteri: Secondary | ICD-10-CM | POA: Insufficient documentation

## 2024-07-11 LAB — POCT URINE PREGNANCY: Preg Test, Ur: NEGATIVE

## 2024-07-11 NOTE — Patient Instructions (Signed)
 Colposcopy, Care After  The following information offers guidance on how to care for yourself after your procedure. Your health care provider may also give you more specific instructions. If you have problems or questions, contact your health care provider. What can I expect after the procedure? If you had a colposcopy without a biopsy, you can expect to feel fine right away after your procedure. However, you may have some spotting of blood for a few days. You can return to your normal activities. If you had a colposcopy with a biopsy, it is common after the procedure to have: Soreness and mild pain. These may last for a few days. Mild vaginal bleeding or discharge that is dark-colored and grainy. This may last for a few days. The discharge may be caused by a liquid (solution) that was used during the procedure. You may need to wear a sanitary pad during this time. Spotting of blood for at least 48 hours after the procedure. Follow these instructions at home: Medicines Take over-the-counter and prescription medicines only as told by your health care provider. Talk with your health care provider about what type of over-the-counter pain medicines and prescription medicines you can start to take again. It is especially important to talk with your health care provider if you take blood thinners. Activity Avoid using douche products, using tampons, and having sex for at least 3 days after the procedure or for as long as told by your health care provider. Return to your normal activities as told by your health care provider. Ask your health care provider what activities are safe for you. General instructions Ask your health care provider if you may take baths, swim, or use a hot tub. You may take showers. If you use birth control (contraception), continue to use it. Keep all follow-up visits. This is important. Contact a health care provider if: You have a fever or chills. You faint or feel  light-headed. Get help right away if: You have heavy bleeding from your vagina or pass blood clots. Heavy bleeding is bleeding that soaks through a sanitary pad in less than 1 hour. You have vaginal discharge that is abnormal, is yellow in color, or smells bad. This could be a sign of infection. You have severe pain or cramps in your lower abdomen that do not go away with medicine. Summary If you had a colposcopy without a biopsy, you can expect to feel fine right away, but you may have some spotting of blood for a few days. You can return to your normal activities. If you had a colposcopy with a biopsy, it is common to have mild pain for a few days and spotting for 48 hours after the procedure. Avoid using douche products, using tampons, and having sex for at least 3 days after the procedure or for as long as told by your health care provider. Get help right away if you have heavy bleeding, severe pain, or signs of infection. This information is not intended to replace advice given to you by your health care provider. Make sure you discuss any questions you have with your health care provider. Document Revised: 03/09/2021 Document Reviewed: 03/09/2021 Elsevier Patient Education  2024 [employer].

## 2024-07-11 NOTE — Progress Notes (Signed)
   COLPOSCOPY PROCEDURE NOTE Patient name: DALAYLA ALDREDGE MRN [id number]  Date of birth: 1996-03-23 Subjective Findings:   Hannah Shaw is a 28 y.o. G80P0010 Caucasian female being seen today for a colposcopy. Indication: Abnormal pap on 06/14/24: LSIL w/ HRHPV positive: other (not 16, 18/45)  Prior cytology:  Date Result Procedure  2023 LSIL  Colposcopy: CIN-1  2022 neg None          No LMP recorded. Contraception: abstinence. Menopausal: no. Hysterectomy: no.   Considering pregnancy: No  Smoker: yes. Immunocompromised: no.   The risks and benefits were explained and informed consent was obtained, and written copy is in chart. Pertinent History Reviewed:   Reviewed past medical,surgical, social, obstetrical and family history.  Reviewed problem list, medications and allergies. Objective Findings & Procedure:   Vitals:   07/11/24 1535  BP: 127/80  Pulse: (!) 102  Weight: 100 lb 8 oz (45.6 kg)  Height: 5' 2 (1.575 m)  Body mass index is 18.38 kg/m.  Results for orders placed or performed in visit on 07/11/24 (from the past 24 hours)  POCT urine pregnancy   Collection Time: 07/11/24  4:26 PM  Result Value Ref Range   Preg Test, Ur Negative Negative     Time out was performed.  Speculum placed in the vagina, cervix fully visualized. SCJ: not fully visualized. Cervix swabbed x 3 with acetic acid.  Acetowhitening present: Yes Cervix: no visible lesions, no mosaicism, no punctation, no abnormal vasculature, and acetowhite lesion(s) noted at 8 o'clock. Endocervical curettage performed, Cervical biopsies taken at 8 o'clock, and Hemostasis achieved with Monsel's solution. Vagina: vaginal colposcopy not performed Vulva: vulvar colposcopy not performed  Specimens: 2  Complications: none  Chaperone: Winton Cherry  Colposcopic Impression & Plan:   Colposcopy findings consistent with LSIL Plan: Post biopsy instructions given, Will notify patient of results when back, and Will  base plan of care on pathology results and ASCCP guidelines  Return in about 1 year (around 07/11/2025) for Pap & physical.  Suzen JONELLE Fetters CNM, [facility] 07/11/2024 4:27 PM

## 2024-07-13 ENCOUNTER — Telehealth: Payer: Self-pay | Admitting: Women's Health

## 2024-07-13 ENCOUNTER — Ambulatory Visit: Payer: Self-pay | Admitting: Women's Health

## 2024-07-13 LAB — SURGICAL PATHOLOGY

## 2024-07-13 NOTE — Telephone Encounter (Signed)
 I called to get patient set up for an appointment with a MD to discuss options based on her colpo results. Patient would like somebody to call her so she can know what her results were. Patient doesn't have MyChart. Please advise.

## 2024-07-13 NOTE — Telephone Encounter (Signed)
 Spoke with patient. She is just worried about what is going to happen next. I gave reassurance and patient will be seen next week to discuss the results and the next steps. Patient agreeable and no other questions at this time.

## 2024-07-18 ENCOUNTER — Ambulatory Visit: Admitting: Obstetrics & Gynecology

## 2024-07-19 ENCOUNTER — Encounter: Payer: Self-pay | Admitting: Obstetrics & Gynecology

## 2024-07-19 ENCOUNTER — Ambulatory Visit (INDEPENDENT_AMBULATORY_CARE_PROVIDER_SITE_OTHER): Admitting: Obstetrics & Gynecology

## 2024-07-19 VITALS — BP 122/79 | HR 98 | Ht 62.0 in | Wt 99.8 lb

## 2024-07-19 DIAGNOSIS — N871 Moderate cervical dysplasia: Secondary | ICD-10-CM | POA: Diagnosis not present

## 2024-07-19 NOTE — Progress Notes (Signed)
   GYN VISIT Patient name: Hannah Shaw MRN [id number]  Date of birth: August 06, 1996 Chief Complaint:   Results  History of Present Illness:   Hannah Shaw is a 28 y.o. G47P0010  female being seen today to review results of recent colposcopy.  Colposcopy showed CIN-1-CIN-2 Prior pap: LSIL, HPV+ Prior colposcopy CIN 1, pap LSIL  She reports no acute complaints or changes since her prior visit.    She does not desire pregnancy in the future    Review of Systems:   Pertinent items are noted in HPI Denies fever/chills, dizziness, headaches, visual disturbances, fatigue, shortness of breath, chest pain, abdominal pain, vomiting, no problems with periods, bowel movements, urination, or intercourse unless otherwise stated above.  Pertinent History Reviewed:   Past Surgical History:  Procedure Laterality Date   APPENDECTOMY     COLONOSCOPY WITH PROPOFOL  N/A 09/08/2019   Procedure: COLONOSCOPY WITH PROPOFOL ;  Surgeon: Dessa Reyes ORN, MD;  Location: [facility];  Service: Endoscopy;  Laterality: N/A;   DIAGNOSTIC LAPAROSCOPY     ENDOMETRIAL BIOPSY     ESOPHAGOGASTRODUODENOSCOPY (EGD) WITH PROPOFOL  N/A 09/08/2019   Procedure: ESOPHAGOGASTRODUODENOSCOPY (EGD) WITH PROPOFOL ;  Surgeon: Dessa Reyes ORN, MD;  Location: [facility] ENDOSCOPY;  Service: Endoscopy;  Laterality: N/A;   GALLBLADDER SURGERY  2024   LAPAROSCOPY N/A 03/13/2016   Procedure: LAPAROSCOPY DIAGNOSTIC;  Surgeon: Mitzie BROCKS Ward, MD;  Location: [facility] ORS;  Service: Gynecology;  Laterality: N/A;    Past Medical History:  Diagnosis Date   Anxiety    Asthma    WELL CONTROLLED   Chronic bilateral low back pain with bilateral sciatica    Chronic kidney disease    H/O STONES   Constipation    Depression    Endometriosis    GERD (gastroesophageal reflux disease)    Headache    History of kidney stones    History of ovarian cyst    Hypothyroidism    Miscarriage    Thyroid  disease    Verbal apraxia    Verbal apraxia     Reviewed problem list, medications and allergies. Physical Assessment:   Vitals:   07/19/24 1048  BP: 122/79  Pulse: 98  Weight: 99 lb 12.8 oz (45.3 kg)  Height: 5' 2 (1.575 m)  Body mass index is 18.25 kg/m.       Physical Examination:   General appearance: alert, well appearing, and in no distress  Psych: mood appropriate, normal affect  Skin: warm & dry   Cardiovascular: normal heart rate noted  Respiratory: normal respiratory effort, no distress  Abdomen: soft, non-tender   Pelvic: examination not indicated  Extremities: no edema   Chaperone: N/A    Assessment & Plan:  1) CIN 2 - Reviewed ASCCP guidelines - Discussed conservative versus excisional procedure.  Reviewed risk benefits including but not limited to risk of bleeding, infection, cervical shortening.  Questions and concerns were addressed and patient desires to proceed -Discussed hospital expectations and recovery Plan to schedule for LEEP October 8   Orders Placed This Encounter  Procedures   Ambulatory Referral For Surgery Scheduling    Return for Oct 8 surgery- postop with me 10/23.   Almendra Loria, DO Attending Obstetrician & Gynecologist, [facility] for [employer], [facility]

## 2024-08-15 NOTE — Patient Instructions (Signed)
 Hannah Shaw  08/15/2024     @PREFPERIOPPHARMACY @   Your procedure is scheduled on 08/22/2024.   Report to [facility] at 1050 A.M.   Call this number if you have problems the morning of surgery:  [phone]  If you experience any cold or flu symptoms such as cough, fever, chills, shortness of breath, etc. between now and your scheduled surgery, please notify us  at the above number.   Remember:  Do not eat after midnight.    You may drink clear liquids until 0850 am on 08/22/2024.        Clear liquids allowed are:                    Water, Juice (No red color; non-citric and without pulp; diabetics please choose diet or no sugar options), Carbonated beverages (diabetics please choose diet or no sugar options), Clear Tea (No creamer, milk, or cream, including half & half and powdered creamer), Black Coffee Only (No creamer, milk or cream, including half & half and powdered creamer), and Clear Sports drink (No red color; diabetics please choose diet or no sugar options)    Take these medicines the morning of surgery with A SIP OF WATER              gabapentin, zofran  (if needed), oxycodone (if needed).    Do not wear jewelry, make-up or nail polish, including gel polish,  artificial nails, or any other type of covering on natural nails (fingers and  toes).  Do not wear lotions, powders, or perfumes, or deodorant.  Do not shave 48 hours prior to surgery.  Men may shave face and neck.  Do not bring valuables to the hospital.  [facility] is not responsible for any belongings or valuables.  Contacts, dentures or bridgework may not be worn into surgery.  Leave your suitcase in the car.  After surgery it may be brought to your room.  For patients admitted to the hospital, discharge time will be determined by your treatment team.  Patients discharged the day of surgery will not be allowed to drive home and must have someone with them for 24 hours.    Special  instructions:   DO NOT smoke tobacco or vape for 24 hours before your procedure.  Please read over the following fact sheets that you were given. Coughing and Deep Breathing, Anesthesia Post-op Instructions, and Care and Recovery After Surgery      LEEP POST-PROCEDURE INSTRUCTIONS  You may take Ibuprofen , Aleve  or Tylenol  for pain if needed.  Cramping is normal.  You will have black and/or bloody discharge at first.  This will lighten and then turn clear before completely resolving.  This will take 2 to 3 weeks.  Put nothing in your vagina until the bleeding or discharge stops (usually 2 or3 days).  You need to call if you have redness around the biopsy site, if there is any unusual draining, if the bleeding is heavy, or if you are concerned.  Shower or bathe as normal  We will call you within one week with results or we will discuss the results at your follow-up appointment if needed.  You will need to return for a follow-up Pap smear as directed by your physician.General Anesthesia, Adult, Care After The following information offers guidance on how to care for yourself after your procedure. Your health care provider may also give you more specific instructions. If you have problems or questions,  contact your health care provider. What can I expect after the procedure? After the procedure, it is common for people to: Have pain or discomfort at the IV site. Have nausea or vomiting. Have a sore throat or hoarseness. Have trouble concentrating. Feel cold or chills. Feel weak, sleepy, or tired (fatigue). Have soreness and body aches. These can affect parts of the body that were not involved in surgery. Follow these instructions at home: For the time period you were told by your health care provider:  Rest. Do not participate in activities where you could fall or become injured. Do not drive or use machinery. Do not drink alcohol. Do not take sleeping pills or medicines that cause  drowsiness. Do not make important decisions or sign legal documents. Do not take care of children on your own. General instructions Drink enough fluid to keep your urine pale yellow. If you have sleep apnea, surgery and certain medicines can increase your risk for breathing problems. Follow instructions from your health care provider about wearing your sleep device: Anytime you are sleeping, including during daytime naps. While taking prescription pain medicines, sleeping medicines, or medicines that make you drowsy. Return to your normal activities as told by your health care provider. Ask your health care provider what activities are safe for you. Take over-the-counter and prescription medicines only as told by your health care provider. Do not use any products that contain nicotine or tobacco. These products include cigarettes, chewing tobacco, and vaping devices, such as e-cigarettes. These can delay incision healing after surgery. If you need help quitting, ask your health care provider. Contact a health care provider if: You have nausea or vomiting that does not get better with medicine. You vomit every time you eat or drink. You have pain that does not get better with medicine. You cannot urinate or have bloody urine. You develop a skin rash. You have a fever. Get help right away if: You have trouble breathing. You have chest pain. You vomit blood. These symptoms may be an emergency. Get help right away. Call 911. Do not wait to see if the symptoms will go away. Do not drive yourself to the hospital. Summary After the procedure, it is common to have a sore throat, hoarseness, nausea, vomiting, or to feel weak, sleepy, or fatigue. For the time period you were told by your health care provider, do not drive or use machinery. Get help right away if you have difficulty breathing, have chest pain, or vomit blood. These symptoms may be an emergency. This information is not intended to  replace advice given to you by your health care provider. Make sure you discuss any questions you have with your health care provider. Document Revised: 01/09/2022 Document Reviewed: 01/09/2022 Elsevier Patient Education  2024 [employer].

## 2024-08-17 ENCOUNTER — Encounter (HOSPITAL_COMMUNITY)
Admission: RE | Admit: 2024-08-17 | Discharge: 2024-08-17 | Disposition: A | Discharge status: Home / Self Care | Source: Ambulatory Visit | Attending: Obstetrics & Gynecology | Admitting: Obstetrics & Gynecology

## 2024-08-17 ENCOUNTER — Other Ambulatory Visit: Payer: Self-pay

## 2024-08-17 ENCOUNTER — Ambulatory Visit: Admitting: Obstetrics & Gynecology

## 2024-08-17 ENCOUNTER — Encounter (HOSPITAL_COMMUNITY): Payer: Self-pay

## 2024-08-17 DIAGNOSIS — N871 Moderate cervical dysplasia: Secondary | ICD-10-CM | POA: Diagnosis not present

## 2024-08-17 DIAGNOSIS — Z01812 Encounter for preprocedural laboratory examination: Secondary | ICD-10-CM | POA: Insufficient documentation

## 2024-08-17 DIAGNOSIS — Z01818 Encounter for other preprocedural examination: Secondary | ICD-10-CM

## 2024-08-17 LAB — PREGNANCY, URINE: Preg Test, Ur: NEGATIVE

## 2024-08-17 LAB — CBC
HCT: 39.7 % (ref 36.0–46.0)
Hemoglobin: 13.8 g/dL (ref 12.0–15.0)
MCH: 33.3 pg (ref 26.0–34.0)
MCHC: 34.8 g/dL (ref 30.0–36.0)
MCV: 95.7 fL (ref 80.0–100.0)
Platelets: 255 K/uL (ref 150–400)
RBC: 4.15 MIL/uL (ref 3.87–5.11)
RDW: 11.1 % — ABNORMAL LOW (ref 11.5–15.5)
WBC: 8.7 K/uL (ref 4.0–10.5)
nRBC: 0 % (ref 0.0–0.2)

## 2024-08-21 NOTE — H&P (Signed)
 [facility] Attending History and Physical  Hannah Shaw is a 28 y.o. G1P0010 who presents for scheduled LEEP due to cervical dysplasia.  In review, patient has been followed for cervical dysplasia: Colposcopy showed CIN-1-CIN-2 Prior pap: LSIL, HPV+ Prior colposcopy CIN 1, pap LSIL  Denies any abnormal vaginal discharge, fevers, chills, sweats, dysuria, nausea, vomiting, other GI or GU symptoms or other general symptoms.  Past Medical History:  Diagnosis Date   Anxiety    Asthma    WELL CONTROLLED   Chronic bilateral low back pain with bilateral sciatica    Chronic kidney disease    H/O STONES   Constipation    Depression    Endometriosis    GERD (gastroesophageal reflux disease)    Headache    History of kidney stones    History of ovarian cyst    Hypothyroidism    Miscarriage    Thyroid  disease    Verbal apraxia    Verbal apraxia    Past Surgical History:  Procedure Laterality Date   APPENDECTOMY     COLONOSCOPY WITH PROPOFOL  N/A 09/08/2019   Procedure: COLONOSCOPY WITH PROPOFOL ;  Surgeon: Dessa Reyes ORN, MD;  Location: [facility] END[facility];  Service: Endoscopy;  Laterality: N/A;   DIAGNOSTIC LAPAROSCOPY     ENDOMETRIAL BIOPSY     ESOPHAGOGASTRODUODENOSCOPY (EGD) WITH PROPOFOL  N/A 09/08/2019   Procedure: ESOPHAGOGASTRODUODENOSCOPY (EGD) WITH PROPOFOL ;  Surgeon: Dessa Reyes ORN, MD;  Location: [facility];  Service: Endoscopy;  Laterality: N/A;   GALLBLADDER SURGERY  2024   LAPAROSCOPY N/A 03/13/2016   Procedure: LAPAROSCOPY DIAGNOSTIC;  Surgeon: Mitzie BROCKS Ward, MD;  Location: [facility] ORS;  Service: Gynecology;  Laterality: N/A;   OB History  Gravida Para Term Preterm AB Living  1 0 0 0 1 0  SAB IAB Ectopic Multiple Live Births  1 0 0 0 0    # Outcome Date GA Lbr Len/2nd Weight Sex Type Anes PTL Lv  1 SAB 2019          Patient denies any other pertinent gynecologic issues.  Current Facility-Administered Medications on File  Prior to Encounter  Medication Dose Route Frequency Provider Last Rate Last Admin   albuterol  (PROVENTIL ) (2.5 MG/3ML) 0.083% nebulizer solution 2.5 mg  2.5 mg Nebulization Once Tamea Dedra CROME, MD       Current Outpatient Medications on File Prior to Encounter  Medication Sig Dispense Refill   oxyCODONE -acetaminophen  (PERCOCET) 7.5-325 MG tablet Take 1 tablet by mouth every 6 (six) hours as needed for severe pain. 30 tablet 0   acetaminophen  (TYLENOL ) 500 MG tablet Take 1,000 mg by mouth every 6 (six) hours as needed.     Fluticasone-Umeclidin-Vilant (TRELEGY ELLIPTA ) 100-62.5-25 MCG/INH AEPB Inhale 1 puff into the lungs daily. 60 each 6   gabapentin (NEURONTIN) 800 MG tablet Take 800 mg by mouth 3 (three) times daily.     ondansetron  (ZOFRAN -ODT) 4 MG disintegrating tablet Take 1 tablet (4 mg total) by mouth every 6 (six) hours as needed for nausea or vomiting. 20 tablet 0   Allergies  Allergen Reactions   Milk-Related Compounds Anaphylaxis   Morphine  And Codeine Hives and Shortness Of Breath   Amitriptyline Other (See Comments)    Leg Pain   Bacid Other (See Comments)   Buprenorphine -Naloxone  Other (See Comments)   Bupropion Other (See Comments)   Duloxetine Other (See Comments)   Escitalopram Other (See Comments)   Fentanyl      PT DENIES THIS ALLERGY DURING PHONE INTERVIEW ON  03-05-16   Medroxyprogesterone  Other (See Comments)   Mirtazapine Other (See Comments)   Morphine  Other (See Comments)   Norethindrone  Other (See Comments)   Prazosin Other (See Comments)   Quetiapine Other (See Comments)    Really Bad shakes and joint pain.   Sertraline     Other reaction(s): Vomiting   Venlafaxine Other (See Comments)   Betadine [Povidone Iodine] Rash   Iodine Rash    Social History:   reports that she has been smoking cigarettes. She has a 3 pack-year smoking history. She has never used smokeless tobacco. She reports that she does not drink alcohol and does not use drugs. Family  History  Problem Relation Age of Onset   Hypertension Father    Hypertension Mother    Cervical cancer Mother    Lung cancer Paternal Uncle     Review of Systems: Pertinent items noted in HPI and remainder of comprehensive ROS otherwise negative.  PHYSICAL EXAM: Blood pressure 111/75, pulse 77, temperature 98.5 F (36.9 C), temperature source Oral, height 5' 2 (1.575 m), weight 45.3 kg, last menstrual period 07/30/2024, SpO2 100%. CONSTITUTIONAL: Well-developed, well-nourished female in no acute distress.  SKIN: Skin is warm and dry. No rash noted. Not diaphoretic. No erythema. No pallor. NEUROLOGIC: Alert and oriented to person, place, and time. Normal reflexes, muscle tone coordination. No cranial nerve deficit noted. PSYCHIATRIC: Normal mood and affect. Normal behavior. Normal judgment and thought content. CARDIOVASCULAR: Normal heart rate noted, regular rhythm RESPIRATORY: Effort and breath sounds normal, no problems with respiration noted ABDOMEN: Soft, nontender, nondistended. PELVIC: deferred MUSCULOSKELETAL: no calf tenderness bilaterally EXT: no edema bilaterally, normal pulses  Labs: Results for orders placed or performed during the hospital encounter of 08/17/24 (from the past 2 weeks)  CBC   Collection Time: 08/17/24  3:42 PM  Result Value Ref Range   WBC 8.7 4.0 - 10.5 K/uL   RBC 4.15 3.87 - 5.11 MIL/uL   Hemoglobin 13.8 12.0 - 15.0 g/dL   HCT 60.2 63.9 - 53.9 %   MCV 95.7 80.0 - 100.0 fL   MCH 33.3 26.0 - 34.0 pg   MCHC 34.8 30.0 - 36.0 g/dL   RDW 88.8 (L) 88.4 - 84.4 %   Platelets 255 150 - 400 K/uL   nRBC 0.0 0.0 - 0.2 %  Pregnancy, urine   Collection Time: 08/17/24  3:42 PM  Result Value Ref Range   Preg Test, Ur NEGATIVE NEGATIVE    Assessment: High grade cervical dysplasia  Plan: LEEP -NPO -LR @ 125cc/hr -SCDs to OR -Risk/benefits and alternatives reviewed with the patient including but not limited to risk of bleeding, infection and injury such  as cervical shortening.  Questions and concerns were addressed and pt desires to proceed  Garrie Elenes, DO Attending Obstetrician & Gynecologist, [facility] for [employer], [facility]

## 2024-08-22 ENCOUNTER — Ambulatory Visit (HOSPITAL_COMMUNITY)
Admission: RE | Admit: 2024-08-22 | Discharge: 2024-08-22 | Disposition: A | Discharge status: Home / Self Care | Attending: Obstetrics & Gynecology | Admitting: Obstetrics & Gynecology

## 2024-08-22 ENCOUNTER — Encounter (HOSPITAL_COMMUNITY): Payer: Self-pay | Admitting: Obstetrics & Gynecology

## 2024-08-22 ENCOUNTER — Ambulatory Visit (HOSPITAL_COMMUNITY): Admitting: Certified Registered Nurse Anesthetist

## 2024-08-22 ENCOUNTER — Encounter (HOSPITAL_COMMUNITY)
Admission: RE | Disposition: A | Discharge status: Home / Self Care | Payer: Self-pay | Source: Home / Self Care | Attending: Obstetrics & Gynecology

## 2024-08-22 DIAGNOSIS — N871 Moderate cervical dysplasia: Secondary | ICD-10-CM

## 2024-08-22 DIAGNOSIS — F17210 Nicotine dependence, cigarettes, uncomplicated: Secondary | ICD-10-CM | POA: Diagnosis not present

## 2024-08-22 DIAGNOSIS — Z01818 Encounter for other preprocedural examination: Secondary | ICD-10-CM

## 2024-08-22 DIAGNOSIS — J45909 Unspecified asthma, uncomplicated: Secondary | ICD-10-CM | POA: Insufficient documentation

## 2024-08-22 HISTORY — PX: LEEP: SHX91

## 2024-08-22 SURGERY — LEEP (LOOP ELECTROSURGICAL EXCISION PROCEDURE)
Anesthesia: General | Site: Vagina

## 2024-08-22 MED ORDER — PROPOFOL 10 MG/ML IV BOLUS
INTRAVENOUS | Status: DC | PRN
  Administered 2024-08-22: 100 mg via INTRAVENOUS

## 2024-08-22 MED ORDER — ONDANSETRON HCL 4 MG/2ML IJ SOLN
INTRAMUSCULAR | Status: DC | PRN
  Administered 2024-08-22: 4 mg via INTRAVENOUS

## 2024-08-22 MED ORDER — HYDROMORPHONE HCL 1 MG/ML IJ SOLN
INTRAMUSCULAR | Status: AC
  Filled 2024-08-22: qty 0.5

## 2024-08-22 MED ORDER — CHLORHEXIDINE GLUCONATE 0.12 % MT SOLN
OROMUCOSAL | Status: AC
  Filled 2024-08-22: qty 15

## 2024-08-22 MED ORDER — HYDROMORPHONE HCL 1 MG/ML IJ SOLN
INTRAMUSCULAR | Status: DC | PRN
  Administered 2024-08-22 (×3): .5 mg via INTRAVENOUS

## 2024-08-22 MED ORDER — LACTATED RINGERS IV SOLN: INTRAVENOUS | Status: DC

## 2024-08-22 MED ORDER — 0.9 % SODIUM CHLORIDE (POUR BTL) OPTIME
TOPICAL | Status: DC | PRN
  Administered 2024-08-22: 1000 mL

## 2024-08-22 MED ORDER — OXYCODONE HCL 5 MG/5ML PO SOLN: 5.0000 mg | Freq: Once | ORAL | Status: AC | PRN

## 2024-08-22 MED ORDER — LIDOCAINE-EPINEPHRINE 0.5 %-1:200000 IJ SOLN
INTRAMUSCULAR | Status: DC | PRN
  Administered 2024-08-22: 20 mL

## 2024-08-22 MED ORDER — MIDAZOLAM HCL (PF) 2 MG/2ML IJ SOLN
INTRAMUSCULAR | Status: DC | PRN
  Administered 2024-08-22: 2 mg via INTRAVENOUS

## 2024-08-22 MED ORDER — MIDAZOLAM HCL 2 MG/2ML IJ SOLN
INTRAMUSCULAR | Status: AC
  Filled 2024-08-22: qty 2

## 2024-08-22 MED ORDER — ORAL CARE MOUTH RINSE: 15.0000 mL | Freq: Once | OROMUCOSAL | Status: AC

## 2024-08-22 MED ORDER — SCOPOLAMINE 1 MG/3DAYS TD PT72
1.0000 | MEDICATED_PATCH | Freq: Once | TRANSDERMAL | Status: DC
  Administered 2024-08-22: 1 mg via TRANSDERMAL

## 2024-08-22 MED ORDER — LIDOCAINE-EPINEPHRINE 0.5 %-1:200000 IJ SOLN
INTRAMUSCULAR | Status: AC
  Filled 2024-08-22: qty 50

## 2024-08-22 MED ORDER — MONSELS FERRIC SUBSULFATE EX SOLN
CUTANEOUS | Status: AC
  Filled 2024-08-22: qty 8

## 2024-08-22 MED ORDER — CHLORHEXIDINE GLUCONATE 0.12 % MT SOLN
15.0000 mL | Freq: Once | OROMUCOSAL | Status: AC
  Administered 2024-08-22: 15 mL via OROMUCOSAL

## 2024-08-22 MED ORDER — SCOPOLAMINE 1 MG/3DAYS TD PT72
MEDICATED_PATCH | TRANSDERMAL | Status: AC
  Filled 2024-08-22: qty 1

## 2024-08-22 MED ORDER — ONDANSETRON HCL 4 MG/2ML IJ SOLN
INTRAMUSCULAR | Status: AC
  Filled 2024-08-22: qty 2

## 2024-08-22 MED ORDER — LIDOCAINE 2% (20 MG/ML) 5 ML SYRINGE
INTRAMUSCULAR | Status: AC
  Filled 2024-08-22: qty 5

## 2024-08-22 MED ORDER — HYDROMORPHONE HCL 1 MG/ML IJ SOLN
0.2500 mg | INTRAMUSCULAR | Status: DC | PRN
  Administered 2024-08-22: 0.5 mg via INTRAVENOUS
  Filled 2024-08-22: qty 0.5

## 2024-08-22 MED ORDER — ACETIC ACID 4% SOLUTION
Status: DC | PRN
  Administered 2024-08-22: 1 via TOPICAL

## 2024-08-22 MED ORDER — LIDOCAINE 2% (20 MG/ML) 5 ML SYRINGE
INTRAMUSCULAR | Status: DC | PRN
  Administered 2024-08-22: 40 mg via INTRAVENOUS

## 2024-08-22 MED ORDER — MONSELS FERRIC SUBSULFATE EX SOLN
CUTANEOUS | Status: DC | PRN
  Administered 2024-08-22: 1 via TOPICAL

## 2024-08-22 MED ORDER — OXYCODONE HCL 5 MG PO TABS
5.0000 mg | ORAL_TABLET | Freq: Once | ORAL | Status: AC | PRN
  Administered 2024-08-22: 5 mg via ORAL
  Filled 2024-08-22: qty 1

## 2024-08-22 MED ORDER — DEXMEDETOMIDINE HCL IN NACL 80 MCG/20ML IV SOLN
INTRAVENOUS | Status: DC | PRN
  Administered 2024-08-22: 4 ug via INTRAVENOUS
  Administered 2024-08-22: 6 ug via INTRAVENOUS

## 2024-08-22 MED ORDER — FENTANYL CITRATE (PF) 100 MCG/2ML IJ SOLN
INTRAMUSCULAR | Status: AC
  Filled 2024-08-22: qty 2

## 2024-08-22 MED ORDER — PROPOFOL 10 MG/ML IV BOLUS
INTRAVENOUS | Status: AC
  Filled 2024-08-22: qty 20

## 2024-08-22 SURGICAL SUPPLY — 24 items
CLOTH BEACON ORANGE TIMEOUT ST (SAFETY) ×1 IMPLANT
COVER LIGHT HANDLE STERIS (MISCELLANEOUS) ×2 IMPLANT
ELECT BALL LEEP 5MM RED (ELECTRODE) ×1 IMPLANT
ELECTRODE LOOP LP RND 20X12WHT (CUTTING LOOP) IMPLANT
ELECTRODE LOOP LP SQR 10X10ORG (CUTTING LOOP) ×1 IMPLANT
ELECTRODE REM PT RTRN 9FT ADLT (ELECTROSURGICAL) ×1 IMPLANT
GAUZE 4X4 16PLY ~~LOC~~+RFID DBL (SPONGE) ×2 IMPLANT
GLOVE BIO SURGEON STRL SZ 6.5 (GLOVE) ×1 IMPLANT
GLOVE BIO SURGEON STRL SZ7 (GLOVE) ×1 IMPLANT
GLOVE BIOGEL PI IND STRL 7.0 (GLOVE) ×3 IMPLANT
GOWN STRL REUS W/ TWL LRG LVL3 (GOWN DISPOSABLE) ×1 IMPLANT
GOWN STRL REUS W/TWL LRG LVL3 (GOWN DISPOSABLE) ×1 IMPLANT
KIT TURNOVER CYSTO (KITS) ×1 IMPLANT
NDL HYPO 21X1.5 SAFETY (NEEDLE) IMPLANT
NEEDLE HYPO 21X1.5 SAFETY (NEEDLE) ×1 IMPLANT
NS IRRIG 1000ML POUR BTL (IV SOLUTION) ×1 IMPLANT
PACK PERI GYN (CUSTOM PROCEDURE TRAY) ×1 IMPLANT
PAD ARMBOARD POSITIONER FOAM (MISCELLANEOUS) ×1 IMPLANT
PAD TELFA 3X4 1S STER (GAUZE/BANDAGES/DRESSINGS) ×1 IMPLANT
POSITIONER HEAD 8X9X4 ADT (SOFTGOODS) ×1 IMPLANT
SCOPETTES 8 STERILE (MISCELLANEOUS) ×1 IMPLANT
SET BASIN LINEN APH (SET/KITS/TRAYS/PACK) ×1 IMPLANT
SUT VIC AB 0 CT1 27XBRD ANBCTR (SUTURE) ×1 IMPLANT
SYR CONTROL 10ML LL (SYRINGE) ×1 IMPLANT

## 2024-08-22 NOTE — Op Note (Signed)
 OPERATIVE NOTE  PREOPERATIVE DIAGNOSIS:  high grade dysplasia POSTOPERATIVE DIAGNOSIS: same PROCEDURE PERFORMED: Colposcopy, LEEP SURGEON: Dr. Delon Prude ANESTHESIA: General endotracheal.  ESTIMATED BLOOD LOSS: minimal.  IV FLUIDS: 600cc of crystalloid.  SPECIMEN(S):  COMPLICATIONS: None.  CONDITION: Stable.  FINDINGS: Acetowhite changes with increase vascularity noted at 6 o'clock  ??Procedure: Informed consent was obtained from the patient prior to taking her to the operating room where anesthesia was found to be adequate. She was placed in dorsal lithotomy.  She was prepped and draped in normal sterile fashion.  A bivalved coated speculum was placed in the patient's vagina. A grounding pad placed on the patient. Acetic acid solution was applied to the cervix and areas of decreased uptake were noted around the transformation zone.   Local anesthesia was administered via an intracervical block using 20 ml of 0.5% Lidocaine  with epinephrine . The suction was turned on and the Large 1X Fisher Cone Biopsy Excisor on 50 Watts of blended current was used to excise the area of decreased uptake and excise the entire transformation zone. An additional inner margin was obtained to confirm complete excision of dysplasia. Excellent hemostasis was achieved using roller ball coagulation set at 50 Watts coagulation current. Monsel's solution was then applied and the speculum was removed from the vagina. Specimens were sent to pathology.  Counts were correct and pt was taken to recovery room in stable condition.  Sweden Lesure, DO Attending Obstetrician & Gynecologist, [facility] for [employer], [facility]

## 2024-08-22 NOTE — Transfer of Care (Addendum)
 Immediate Anesthesia Transfer of Care Note  Patient: Hannah Shaw  Procedure(s) Performed: LEEP (LOOP ELECTROSURGICAL EXCISION PROCEDURE) (Vagina )  Patient Location: PACU  Anesthesia Type:General  Level of Consciousness: drowsy and patient cooperative  Airway & Oxygen Therapy: Patient Spontanous Breathing  Post-op Assessment: Report given to RN and Post -op Vital signs reviewed and stable  Post vital signs: Reviewed and stable  Last Vitals:  Vitals Value Taken Time  BP 95/53 08/22/24   14:52  Temp 36.8 08/22/24   14:52  Pulse 77 08/22/24 14:52  Resp 23 08/22/24 14:52  SpO2 95 % 08/22/24 14:52  Vitals shown include unfiled device data.  Last Pain:  Vitals:   08/22/24 1307  TempSrc: Oral  PainSc: 7       Patients Stated Pain Goal: 5 (08/22/24 1307)  Complications: Patient continues to remove nasal cannula while rubbing eyes. Patient redirected to remove hands from eyes. Patient states the nasal cannula itches her nose. Nasal cannula removed. Patient spontaneously breathing with comfort. Vital signs stable.

## 2024-08-22 NOTE — Anesthesia Procedure Notes (Signed)
 Procedure Name: LMA Insertion Date/Time: 08/22/2024 2:12 PM  Performed by: Elaine Delon CROME, CRNAPre-anesthesia Checklist: Emergency Drugs available, Patient identified, Suction available and Patient being monitored Patient Re-evaluated:Patient Re-evaluated prior to induction Oxygen Delivery Method: Circle system utilized Preoxygenation: Pre-oxygenation with 100% oxygen Induction Type: IV induction LMA: LMA inserted LMA Size: 4.0 Number of attempts: 1 Placement Confirmation: positive ETCO2 and breath sounds checked- equal and bilateral Tube secured with: Tape Dental Injury: Teeth and Oropharynx as per pre-operative assessment

## 2024-08-22 NOTE — Anesthesia Preprocedure Evaluation (Addendum)
 Anesthesia Evaluation  Patient identified by MRN, date of birth, ID band Patient awake    Reviewed: Allergy & Precautions, H&P , NPO status , Patient's Chart, lab work & pertinent test results  Airway Mallampati: II  TM Distance: >3 FB Neck ROM: Full    Dental  (+) Poor Dentition, Loose   Pulmonary asthma , Current Smoker and Patient abstained from smoking.   Pulmonary exam normal breath sounds clear to auscultation       Cardiovascular negative cardio ROS Normal cardiovascular exam Rhythm:Regular Rate:Normal     Neuro/Psych  Headaches PSYCHIATRIC DISORDERS Anxiety Depression     Neuromuscular disease    GI/Hepatic Neg liver ROS,GERD  ,,  Endo/Other  Hypothyroidism    Renal/GU Renal disease  negative genitourinary   Musculoskeletal negative musculoskeletal ROS (+)    Abdominal   Peds negative pediatric ROS (+)  Hematology negative hematology ROS (+)   Anesthesia Other Findings   Reproductive/Obstetrics negative OB ROS                              Anesthesia Physical Anesthesia Plan  ASA: 2  Anesthesia Plan: General   Post-op Pain Management:    Induction: Intravenous  PONV Risk Score and Plan: Scopolamine patch - Pre-op  Airway Management Planned: LMA  Additional Equipment:   Intra-op Plan:   Post-operative Plan: Extubation in OR  Informed Consent: I have reviewed the patients History and Physical, chart, labs and discussed the procedure including the risks, benefits and alternatives for the proposed anesthesia with the patient or authorized representative who has indicated his/her understanding and acceptance.     Dental advisory given  Plan Discussed with: CRNA  Anesthesia Plan Comments:          Anesthesia Quick Evaluation

## 2024-08-22 NOTE — Discharge Instructions (Addendum)
HOME INSTRUCTIONS  Please note any unusual or excessive bleeding, pain, swelling. Mild dizziness or drowsiness are normal for about 24 hours after surgery.   Shower when comfortable  Restrictions: No driving for 24 hours or while taking pain medications.  Activity:  No heavy lifting (> 20 lbs), nothing in vagina (no tampons, douching, or intercourse) x 4 weeks; no tub baths for 4 weeks Vaginal spotting is expected but if your bleeding is heavy, period like,  please call the office   Diet:  You may return to your regular diet.  Do not eat large meals.  Eat small frequent meals throughout the day.  Continue to drink a good amount of water at least 6-8 glasses of water per day, hydration is very important for the healing process.  Pain Management: Take over the counter tylenol or ibuprofen as needed for pain.  You can either take one or alternate between the two medications for pain management.  You may also use a heating pack as needed.    Alcohol -- Avoid for 24 hours and while taking pain medications.  Nausea: Take sips of ginger ale or soda  Fever -- Call physician if temperature over 101 degrees  Follow up:  If you do not already have a follow up appointment scheduled, please call the office at [phone].  If you experience fever (a temperature greater than 100.4), pain unrelieved by pain medication, shortness of breath, swelling of a single leg, or any other symptoms which are concerning to you please the office immediately.

## 2024-08-22 NOTE — Progress Notes (Signed)
 Gave patient the option of staying or leaving and coming back because the surgeon is an hour behind schedule and there is another case to be done before this patient.  Patient will leave for a little while and come back so she doesn't have to wait here at the hospital so long.  Took patients stickers/armband to preop.  Reminded the patient not to eat or drink, she voiced understanding.  Told patient when she comes back she doesn't have to register again and to just come to the waiting room and check in at the desk with Luke Ros.  Patient voiced understanding.

## 2024-08-23 ENCOUNTER — Encounter (HOSPITAL_COMMUNITY): Payer: Self-pay | Admitting: Obstetrics & Gynecology

## 2024-08-23 NOTE — Anesthesia Postprocedure Evaluation (Signed)
 Anesthesia Post Note  Patient: Hannah Shaw  Procedure(s) Performed: LEEP (LOOP ELECTROSURGICAL EXCISION PROCEDURE) (Vagina )  Patient location during evaluation: PACU Anesthesia Type: General Level of consciousness: awake and alert Pain management: pain level controlled Vital Signs Assessment: post-procedure vital signs reviewed and stable Respiratory status: spontaneous breathing, nonlabored ventilation, respiratory function stable and patient connected to nasal cannula oxygen Cardiovascular status: blood pressure returned to baseline and stable Postop Assessment: no apparent nausea or vomiting Anesthetic complications: no   No notable events documented.   Last Vitals:  Vitals:   08/22/24 1519 08/22/24 1531  BP:  117/78  Pulse:  79  Resp: 20 16  Temp:  36.8 C  SpO2:  97%    Last Pain:  Vitals:   08/22/24 1534  TempSrc:   PainSc: 8                  Andrea Limes

## 2024-08-25 LAB — SURGICAL PATHOLOGY

## 2024-08-28 ENCOUNTER — Ambulatory Visit: Payer: Self-pay | Admitting: Obstetrics & Gynecology

## 2024-08-31 ENCOUNTER — Ambulatory Visit: Admitting: Obstetrics & Gynecology

## 2024-08-31 ENCOUNTER — Encounter: Payer: Self-pay | Admitting: Obstetrics & Gynecology

## 2024-08-31 VITALS — BP 111/76 | HR 97 | Wt 99.8 lb

## 2024-08-31 DIAGNOSIS — Z9889 Other specified postprocedural states: Secondary | ICD-10-CM

## 2024-08-31 NOTE — Progress Notes (Signed)
    PostOp Visit Note  Hannah Shaw is a 28 y.o. [id number] female who presents for a postoperative visit. She is 1 weeks postop following a LEEP completed on 10/28   Today she notes some cramping and irregular bleeding.   Denies fever or chills.  Tolerating gen diet.  Overall doing ok   Review of Systems Pertinent items are noted in HPI.    Objective:  BP 111/76 (BP Location: Right Arm, Patient Position: Sitting, Cuff Size: Normal)   Pulse 97   Wt 99 lb 12.8 oz (45.3 kg)   LMP 07/30/2024 (Exact Date) Comment: Urine pregnancy negative 08-17-2024  BMI 18.25 kg/m    Physical Examination:  GENERAL ASSESSMENT: no acute distress SKIN: warm and dry CHEST: normal air exchange, respiratory effort normal with no retractions HEART: regular rate and rhythm ABDOMEN: soft, non-distended, no rebound or guarding GU: healing appropriately EXTREMITY: no edema or calf tenderness bilaterally PSYCH: mood appropriate, normal affect       Assessment:    S/p LEEP   Plan:   -review pathology report- 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
